# Patient Record
Sex: Male | Born: 1935 | Race: Black or African American | Hispanic: No | Marital: Married | Smoking: Never smoker
Health system: Southern US, Community
[De-identification: ages and names within clinical notes are randomized; demographics above are authoritative.]

## PROBLEM LIST (undated history)

## (undated) DIAGNOSIS — I6381 Other cerebral infarction due to occlusion or stenosis of small artery: Secondary | ICD-10-CM

## (undated) DIAGNOSIS — D509 Iron deficiency anemia, unspecified: Secondary | ICD-10-CM

## (undated) DIAGNOSIS — Z9119 Patient's noncompliance with other medical treatment and regimen: Secondary | ICD-10-CM

## (undated) DIAGNOSIS — I219 Acute myocardial infarction, unspecified: Secondary | ICD-10-CM

## (undated) DIAGNOSIS — I519 Heart disease, unspecified: Secondary | ICD-10-CM

## (undated) DIAGNOSIS — C61 Malignant neoplasm of prostate: Secondary | ICD-10-CM

## (undated) DIAGNOSIS — M1990 Unspecified osteoarthritis, unspecified site: Secondary | ICD-10-CM

## (undated) DIAGNOSIS — R569 Unspecified convulsions: Secondary | ICD-10-CM

## (undated) DIAGNOSIS — J189 Pneumonia, unspecified organism: Secondary | ICD-10-CM

## (undated) DIAGNOSIS — R7301 Impaired fasting glucose: Secondary | ICD-10-CM

## (undated) DIAGNOSIS — Z91199 Patient's noncompliance with other medical treatment and regimen due to unspecified reason: Secondary | ICD-10-CM

## (undated) DIAGNOSIS — I10 Essential (primary) hypertension: Secondary | ICD-10-CM

## (undated) DIAGNOSIS — C189 Malignant neoplasm of colon, unspecified: Principal | ICD-10-CM

## (undated) DIAGNOSIS — N189 Chronic kidney disease, unspecified: Secondary | ICD-10-CM

## (undated) DIAGNOSIS — I2510 Atherosclerotic heart disease of native coronary artery without angina pectoris: Secondary | ICD-10-CM

## (undated) DIAGNOSIS — N529 Male erectile dysfunction, unspecified: Secondary | ICD-10-CM

## (undated) DIAGNOSIS — I639 Cerebral infarction, unspecified: Secondary | ICD-10-CM

## (undated) HISTORY — DX: Other cerebral infarction due to occlusion or stenosis of small artery: I63.81

## (undated) HISTORY — PX: PROSTATE SURGERY: SHX751

## (undated) HISTORY — DX: Patient's noncompliance with other medical treatment and regimen due to unspecified reason: Z91.199

## (undated) HISTORY — DX: Patient's noncompliance with other medical treatment and regimen: Z91.19

## (undated) HISTORY — DX: Iron deficiency anemia, unspecified: D50.9

## (undated) HISTORY — DX: Atherosclerotic heart disease of native coronary artery without angina pectoris: I25.10

## (undated) HISTORY — PX: COLECTOMY: SHX59

## (undated) HISTORY — DX: Malignant neoplasm of colon, unspecified: C18.9

## (undated) HISTORY — DX: Heart disease, unspecified: I51.9

## (undated) HISTORY — DX: Impaired fasting glucose: R73.01

## (undated) HISTORY — DX: Essential (primary) hypertension: I10

## (undated) HISTORY — DX: Male erectile dysfunction, unspecified: N52.9

## (undated) HISTORY — PX: COLONOSCOPY: SHX174

## (undated) HISTORY — PX: CATARACT EXTRACTION: SUR2

---

## 1989-08-07 DIAGNOSIS — C61 Malignant neoplasm of prostate: Secondary | ICD-10-CM

## 1989-08-07 HISTORY — PX: ANKLE FRACTURE SURGERY: SHX122

## 1989-08-07 HISTORY — DX: Malignant neoplasm of prostate: C61

## 2001-08-30 ENCOUNTER — Emergency Department (HOSPITAL_COMMUNITY): Admission: EM | Admit: 2001-08-30 | Discharge: 2001-08-30 | Payer: Self-pay | Admitting: Emergency Medicine

## 2002-07-24 ENCOUNTER — Ambulatory Visit: Admission: RE | Admit: 2002-07-24 | Discharge: 2002-10-20 | Payer: Self-pay | Admitting: Radiation Oncology

## 2003-03-26 ENCOUNTER — Ambulatory Visit (HOSPITAL_COMMUNITY): Admission: RE | Admit: 2003-03-26 | Discharge: 2003-03-26 | Payer: Self-pay | Admitting: Ophthalmology

## 2006-06-19 ENCOUNTER — Emergency Department (HOSPITAL_COMMUNITY): Admission: EM | Admit: 2006-06-19 | Discharge: 2006-06-19 | Payer: Self-pay | Admitting: Emergency Medicine

## 2008-08-27 ENCOUNTER — Ambulatory Visit (HOSPITAL_COMMUNITY): Admission: RE | Admit: 2008-08-27 | Discharge: 2008-08-27 | Payer: Self-pay | Admitting: Family Medicine

## 2010-07-04 ENCOUNTER — Ambulatory Visit: Payer: Self-pay | Admitting: Cardiology

## 2010-07-04 ENCOUNTER — Inpatient Hospital Stay (HOSPITAL_COMMUNITY): Admission: EM | Admit: 2010-07-04 | Discharge: 2010-07-15 | Payer: Self-pay | Admitting: Emergency Medicine

## 2010-07-04 ENCOUNTER — Ambulatory Visit: Payer: Self-pay | Admitting: Gastroenterology

## 2010-07-06 ENCOUNTER — Ambulatory Visit: Payer: Self-pay | Admitting: Gastroenterology

## 2010-07-06 ENCOUNTER — Encounter (INDEPENDENT_AMBULATORY_CARE_PROVIDER_SITE_OTHER): Payer: Self-pay

## 2010-07-07 ENCOUNTER — Encounter (INDEPENDENT_AMBULATORY_CARE_PROVIDER_SITE_OTHER): Payer: Self-pay | Admitting: Internal Medicine

## 2010-07-09 ENCOUNTER — Encounter (INDEPENDENT_AMBULATORY_CARE_PROVIDER_SITE_OTHER): Payer: Self-pay

## 2010-07-21 ENCOUNTER — Encounter: Payer: Self-pay | Admitting: Gastroenterology

## 2010-08-18 ENCOUNTER — Encounter (HOSPITAL_COMMUNITY)
Admission: RE | Admit: 2010-08-18 | Discharge: 2010-09-05 | Payer: Self-pay | Source: Home / Self Care | Admitting: Oncology

## 2010-08-18 ENCOUNTER — Ambulatory Visit (HOSPITAL_COMMUNITY): Payer: Self-pay | Admitting: Oncology

## 2010-09-19 ENCOUNTER — Encounter (HOSPITAL_COMMUNITY)
Admission: RE | Admit: 2010-09-19 | Discharge: 2010-10-19 | Payer: Self-pay | Source: Home / Self Care | Admitting: Hematology and Oncology

## 2010-09-19 ENCOUNTER — Ambulatory Visit (HOSPITAL_COMMUNITY): Payer: Self-pay | Admitting: Hematology and Oncology

## 2010-09-24 ENCOUNTER — Encounter (HOSPITAL_COMMUNITY)
Admission: RE | Admit: 2010-09-24 | Discharge: 2010-10-24 | Payer: Self-pay | Source: Home / Self Care | Admitting: Oncology

## 2010-12-24 ENCOUNTER — Encounter (HOSPITAL_COMMUNITY)
Admission: RE | Admit: 2010-12-24 | Discharge: 2011-01-06 | Payer: Self-pay | Source: Home / Self Care | Attending: Oncology | Admitting: Oncology

## 2010-12-24 ENCOUNTER — Ambulatory Visit (HOSPITAL_COMMUNITY)
Admission: RE | Admit: 2010-12-24 | Discharge: 2011-01-06 | Payer: Self-pay | Source: Home / Self Care | Attending: Oncology | Admitting: Oncology

## 2010-12-29 LAB — COMPREHENSIVE METABOLIC PANEL
ALT: 12 U/L (ref 0–53)
AST: 18 U/L (ref 0–37)
Albumin: 4.1 g/dL (ref 3.5–5.2)
Alkaline Phosphatase: 74 U/L (ref 39–117)
BUN: 11 mg/dL (ref 6–23)
CO2: 32 mEq/L (ref 19–32)
Calcium: 9.3 mg/dL (ref 8.4–10.5)
Chloride: 97 mEq/L (ref 96–112)
Creatinine, Ser: 0.98 mg/dL (ref 0.4–1.5)
GFR calc Af Amer: >60 mL/min (ref >60)
GFR calc non Af Amer: >60 mL/min (ref >60)
Potassium: 3.4 mEq/L — ABNORMAL LOW (ref 3.5–5.1)
Sodium: 139 mEq/L (ref 135–145)
Total Bilirubin: 1.2 mg/dL (ref 0.3–1.2)
Total Protein: 7.6 g/dL (ref 6.0–8.3)

## 2011-01-06 NOTE — Letter (Signed)
Summary: PATHOLOGY REPORT FROM 07/06/10  PATHOLOGY REPORT FROM 07/06/10   Imported By: Rexene Alberts 07/21/2010 13:51:26  _____________________________________________________________________  External Attachment:    Type:   Image     Comment:   External Document

## 2011-01-06 NOTE — Letter (Signed)
Summary: PATHOLOGY RESULTS FROM 07/09/10  PATHOLOGY RESULTS FROM 07/09/10   Imported By: Rexene Alberts 07/21/2010 13:52:56  _____________________________________________________________________  External Attachment:    Type:   Image     Comment:   External Document

## 2011-01-06 NOTE — Letter (Signed)
Summary: DISCHARGE SUMMARY FROM08/09/11  DISCHARGE SUMMARY FROM08/09/11   Imported By: Rexene Alberts 07/21/2010 13:48:53  _____________________________________________________________________  External Attachment:    Type:   Image     Comment:   External Document

## 2011-01-06 NOTE — Letter (Signed)
Summary: CONSULTATION FROM 07/04/10  CONSULTATION FROM 07/04/10   Imported By: Rexene Alberts 07/21/2010 13:46:57  _____________________________________________________________________  External Attachment:    Type:   Image     Comment:   External Document

## 2011-02-18 LAB — CBC
HCT: 33 % — ABNORMAL LOW (ref 39.0–52.0)
Hemoglobin: 10.8 g/dL — ABNORMAL LOW (ref 13.0–17.0)
MCH: 26.5 pg (ref 26.0–34.0)
MCHC: 32.8 g/dL (ref 30.0–36.0)
MCV: 80.8 fL (ref 78.0–100.0)
Platelets: 241 10*3/uL (ref 150–400)
RBC: 4.08 MIL/uL — ABNORMAL LOW (ref 4.22–5.81)
RDW: 13.8 % (ref 11.5–15.5)
WBC: 5.5 10*3/uL (ref 4.0–10.5)

## 2011-02-18 LAB — COMPREHENSIVE METABOLIC PANEL
ALT: 15 U/L (ref 0–53)
AST: 19 U/L (ref 0–37)
Albumin: 3.8 g/dL (ref 3.5–5.2)
Alkaline Phosphatase: 74 U/L (ref 39–117)
BUN: 6 mg/dL (ref 6–23)
CO2: 31 mEq/L (ref 19–32)
Calcium: 9.3 mg/dL (ref 8.4–10.5)
Chloride: 101 mEq/L (ref 96–112)
Creatinine, Ser: 0.88 mg/dL (ref 0.4–1.5)
GFR calc Af Amer: >60 mL/min (ref >60)
GFR calc non Af Amer: >60 mL/min (ref >60)
Glucose, Bld: 71 mg/dL (ref 70–99)
Potassium: 3.4 mEq/L — ABNORMAL LOW (ref 3.5–5.1)
Sodium: 138 mEq/L (ref 135–145)
Total Bilirubin: 0.7 mg/dL (ref 0.3–1.2)
Total Protein: 7.5 g/dL (ref 6.0–8.3)

## 2011-02-18 LAB — DIFFERENTIAL
Basophils Absolute: 0 10*3/uL (ref 0.0–0.1)
Basophils Relative: 0 % (ref 0–1)
Eosinophils Absolute: 0.1 10*3/uL (ref 0.0–0.7)
Eosinophils Relative: 1 % (ref 0–5)
Lymphocytes Relative: 22 % (ref 12–46)
Lymphs Abs: 1.2 10*3/uL (ref 0.7–4.0)
Monocytes Absolute: 0.5 10*3/uL (ref 0.1–1.0)
Monocytes Relative: 8 % (ref 3–12)
Neutro Abs: 3.8 10*3/uL (ref 1.7–7.7)
Neutrophils Relative %: 68 % (ref 43–77)

## 2011-02-18 LAB — CEA: CEA: 2.2 ng/mL (ref 0.0–5.0)

## 2011-02-18 LAB — PSA: PSA: 0.53 ng/mL (ref 0.10–4.00)

## 2011-02-18 LAB — LACTATE DEHYDROGENASE: LDH: 145 U/L (ref 94–250)

## 2011-02-19 LAB — IRON AND TIBC
Iron: 29 ug/dL — ABNORMAL LOW (ref 42–135)
Saturation Ratios: 10 % — ABNORMAL LOW (ref 20–55)
TIBC: 295 ug/dL (ref 215–435)
UIBC: 266 ug/dL

## 2011-02-19 LAB — COMPREHENSIVE METABOLIC PANEL
ALT: 13 U/L (ref 0–53)
AST: 18 U/L (ref 0–37)
Albumin: 3.5 g/dL (ref 3.5–5.2)
Alkaline Phosphatase: 84 U/L (ref 39–117)
BUN: 12 mg/dL (ref 6–23)
CO2: 31 mEq/L (ref 19–32)
Calcium: 9.3 mg/dL (ref 8.4–10.5)
Chloride: 102 mEq/L (ref 96–112)
Creatinine, Ser: 1 mg/dL (ref 0.4–1.5)
GFR calc Af Amer: >60 mL/min (ref >60)
GFR calc non Af Amer: >60 mL/min (ref >60)
Glucose, Bld: 103 mg/dL — ABNORMAL HIGH (ref 70–99)
Potassium: 4 mEq/L (ref 3.5–5.1)
Sodium: 139 mEq/L (ref 135–145)
Total Bilirubin: 0.4 mg/dL (ref 0.3–1.2)
Total Protein: 8.4 g/dL — ABNORMAL HIGH (ref 6.0–8.3)

## 2011-02-19 LAB — DIFFERENTIAL
Basophils Absolute: 0 10*3/uL (ref 0.0–0.1)
Basophils Relative: 0 % (ref 0–1)
Eosinophils Absolute: 0.3 10*3/uL (ref 0.0–0.7)
Eosinophils Relative: 5 % (ref 0–5)
Lymphocytes Relative: 21 % (ref 12–46)
Lymphs Abs: 1.3 10*3/uL (ref 0.7–4.0)
Monocytes Absolute: 0.3 10*3/uL (ref 0.1–1.0)
Monocytes Relative: 5 % (ref 3–12)
Neutro Abs: 4.3 10*3/uL (ref 1.7–7.7)
Neutrophils Relative %: 69 % (ref 43–77)

## 2011-02-19 LAB — FERRITIN: Ferritin: 20 ng/mL — ABNORMAL LOW (ref 22–322)

## 2011-02-19 LAB — CBC
HCT: 33.4 % — ABNORMAL LOW (ref 39.0–52.0)
Hemoglobin: 10.8 g/dL — ABNORMAL LOW (ref 13.0–17.0)
MCH: 26.1 pg (ref 26.0–34.0)
MCHC: 32.3 g/dL (ref 30.0–36.0)
MCV: 80.9 fL (ref 78.0–100.0)
Platelets: 233 10*3/uL (ref 150–400)
RBC: 4.13 MIL/uL — ABNORMAL LOW (ref 4.22–5.81)
RDW: 24.4 % — ABNORMAL HIGH (ref 11.5–15.5)
WBC: 6.2 10*3/uL (ref 4.0–10.5)

## 2011-02-19 LAB — CEA: CEA: 2 ng/mL (ref 0.0–5.0)

## 2011-02-20 LAB — PHOSPHORUS
Phosphorus: 3.2 mg/dL (ref 2.3–4.6)
Phosphorus: 3.4 mg/dL (ref 2.3–4.6)

## 2011-02-20 LAB — LIPID PANEL
Cholesterol: 55 mg/dL (ref 0–200)
HDL: 32 mg/dL — ABNORMAL LOW (ref >39)
LDL Cholesterol: 5 mg/dL (ref 0–99)
Total CHOL/HDL Ratio: 1.7 RATIO
Triglycerides: 91 mg/dL (ref <150)
VLDL: 18 mg/dL (ref 0–40)

## 2011-02-20 LAB — BASIC METABOLIC PANEL
BUN: 1 mg/dL — ABNORMAL LOW (ref 6–23)
BUN: 3 mg/dL — ABNORMAL LOW (ref 6–23)
BUN: 3 mg/dL — ABNORMAL LOW (ref 6–23)
BUN: 3 mg/dL — ABNORMAL LOW (ref 6–23)
BUN: 5 mg/dL — ABNORMAL LOW (ref 6–23)
BUN: 5 mg/dL — ABNORMAL LOW (ref 6–23)
BUN: 5 mg/dL — ABNORMAL LOW (ref 6–23)
CO2: 25 mEq/L (ref 19–32)
CO2: 25 mEq/L (ref 19–32)
CO2: 27 mEq/L (ref 19–32)
Calcium: 8 mg/dL — ABNORMAL LOW (ref 8.4–10.5)
Calcium: 8.1 mg/dL — ABNORMAL LOW (ref 8.4–10.5)
Calcium: 8.3 mg/dL — ABNORMAL LOW (ref 8.4–10.5)
Calcium: 8.3 mg/dL — ABNORMAL LOW (ref 8.4–10.5)
Calcium: 8.4 mg/dL (ref 8.4–10.5)
Chloride: 102 mEq/L (ref 96–112)
Chloride: 102 mEq/L (ref 96–112)
Chloride: 103 mEq/L (ref 96–112)
Chloride: 104 mEq/L (ref 96–112)
Creatinine, Ser: 0.72 mg/dL (ref 0.4–1.5)
Creatinine, Ser: 0.77 mg/dL (ref 0.4–1.5)
Creatinine, Ser: 0.8 mg/dL (ref 0.4–1.5)
Creatinine, Ser: 1 mg/dL (ref 0.4–1.5)
Creatinine, Ser: 1.03 mg/dL (ref 0.4–1.5)
GFR calc Af Amer: >60 mL/min (ref >60)
GFR calc Af Amer: >60 mL/min (ref >60)
GFR calc Af Amer: >60 mL/min (ref >60)
GFR calc Af Amer: >60 mL/min (ref >60)
GFR calc non Af Amer: >60 mL/min (ref >60)
GFR calc non Af Amer: >60 mL/min (ref >60)
GFR calc non Af Amer: >60 mL/min (ref >60)
GFR calc non Af Amer: >60 mL/min (ref >60)
GFR calc non Af Amer: >60 mL/min (ref >60)
Glucose, Bld: 104 mg/dL — ABNORMAL HIGH (ref 70–99)
Glucose, Bld: 113 mg/dL — ABNORMAL HIGH (ref 70–99)
Glucose, Bld: 90 mg/dL (ref 70–99)
Glucose, Bld: 93 mg/dL (ref 70–99)
Potassium: 3.4 mEq/L — ABNORMAL LOW (ref 3.5–5.1)
Potassium: 3.6 mEq/L (ref 3.5–5.1)
Potassium: 3.8 mEq/L (ref 3.5–5.1)
Potassium: 3.8 mEq/L (ref 3.5–5.1)
Potassium: 3.9 mEq/L (ref 3.5–5.1)
Potassium: 3.9 mEq/L (ref 3.5–5.1)
Sodium: 132 mEq/L — ABNORMAL LOW (ref 135–145)
Sodium: 134 mEq/L — ABNORMAL LOW (ref 135–145)
Sodium: 135 mEq/L (ref 135–145)
Sodium: 138 mEq/L (ref 135–145)

## 2011-02-20 LAB — DIFFERENTIAL
Basophils Absolute: 0 10*3/uL (ref 0.0–0.1)
Basophils Absolute: 0 10*3/uL (ref 0.0–0.1)
Basophils Absolute: 0 10*3/uL (ref 0.0–0.1)
Basophils Absolute: 0 10*3/uL (ref 0.0–0.1)
Basophils Relative: 0 % (ref 0–1)
Basophils Relative: 0 % (ref 0–1)
Basophils Relative: 0 % (ref 0–1)
Eosinophils Absolute: 0 10*3/uL (ref 0.0–0.7)
Eosinophils Absolute: 0.1 10*3/uL (ref 0.0–0.7)
Eosinophils Absolute: 0.2 10*3/uL (ref 0.0–0.7)
Eosinophils Relative: 0 % (ref 0–5)
Eosinophils Relative: 0 % (ref 0–5)
Eosinophils Relative: 1 % (ref 0–5)
Eosinophils Relative: 2 % (ref 0–5)
Lymphocytes Relative: 10 % — ABNORMAL LOW (ref 12–46)
Lymphocytes Relative: 12 % (ref 12–46)
Lymphocytes Relative: 15 % (ref 12–46)
Lymphocytes Relative: 17 % (ref 12–46)
Lymphocytes Relative: 19 % (ref 12–46)
Lymphocytes Relative: 7 % — ABNORMAL LOW (ref 12–46)
Lymphs Abs: 0.9 10*3/uL (ref 0.7–4.0)
Lymphs Abs: 0.9 10*3/uL (ref 0.7–4.0)
Lymphs Abs: 1.1 10*3/uL (ref 0.7–4.0)
Lymphs Abs: 1.3 10*3/uL (ref 0.7–4.0)
Monocytes Absolute: 0.4 10*3/uL (ref 0.1–1.0)
Monocytes Absolute: 0.4 10*3/uL (ref 0.1–1.0)
Monocytes Absolute: 0.5 10*3/uL (ref 0.1–1.0)
Monocytes Absolute: 0.5 10*3/uL (ref 0.1–1.0)
Monocytes Absolute: 0.5 10*3/uL (ref 0.1–1.0)
Monocytes Relative: 4 % (ref 3–12)
Monocytes Relative: 6 % (ref 3–12)
Monocytes Relative: 6 % (ref 3–12)
Monocytes Relative: 7 % (ref 3–12)
Neutro Abs: 4.6 10*3/uL (ref 1.7–7.7)
Neutro Abs: 5.5 10*3/uL (ref 1.7–7.7)
Neutro Abs: 5.8 10*3/uL (ref 1.7–7.7)
Neutro Abs: 9.6 10*3/uL — ABNORMAL HIGH (ref 1.7–7.7)
Neutrophils Relative %: 72 % (ref 43–77)
Neutrophils Relative %: 76 % (ref 43–77)
Neutrophils Relative %: 77 % (ref 43–77)

## 2011-02-20 LAB — CBC
HCT: 27.3 % — ABNORMAL LOW (ref 39.0–52.0)
HCT: 29.2 % — ABNORMAL LOW (ref 39.0–52.0)
HCT: 30.9 % — ABNORMAL LOW (ref 39.0–52.0)
HCT: 31.2 % — ABNORMAL LOW (ref 39.0–52.0)
HCT: 34.1 % — ABNORMAL LOW (ref 39.0–52.0)
Hemoglobin: 8.3 g/dL — ABNORMAL LOW (ref 13.0–17.0)
Hemoglobin: 9 g/dL — ABNORMAL LOW (ref 13.0–17.0)
Hemoglobin: 9.7 g/dL — ABNORMAL LOW (ref 13.0–17.0)
Hemoglobin: 9.8 g/dL — ABNORMAL LOW (ref 13.0–17.0)
MCH: 21.8 pg — ABNORMAL LOW (ref 26.0–34.0)
MCH: 23.2 pg — ABNORMAL LOW (ref 26.0–34.0)
MCH: 23.5 pg — ABNORMAL LOW (ref 26.0–34.0)
MCHC: 30.7 g/dL (ref 30.0–36.0)
MCHC: 31 g/dL (ref 30.0–36.0)
MCHC: 31.1 g/dL (ref 30.0–36.0)
MCV: 71.1 fL — ABNORMAL LOW (ref 78.0–100.0)
MCV: 73.5 fL — ABNORMAL LOW (ref 78.0–100.0)
MCV: 74.4 fL — ABNORMAL LOW (ref 78.0–100.0)
Platelets: 130 10*3/uL — ABNORMAL LOW (ref 150–400)
Platelets: 132 10*3/uL — ABNORMAL LOW (ref 150–400)
Platelets: 149 10*3/uL — ABNORMAL LOW (ref 150–400)
Platelets: 216 10*3/uL (ref 150–400)
RBC: 4.11 MIL/uL — ABNORMAL LOW (ref 4.22–5.81)
RBC: 4.2 MIL/uL — ABNORMAL LOW (ref 4.22–5.81)
RBC: 4.21 MIL/uL — ABNORMAL LOW (ref 4.22–5.81)
RBC: 4.24 MIL/uL (ref 4.22–5.81)
RDW: 35 % — ABNORMAL HIGH (ref 11.5–15.5)
RDW: 35.2 % — ABNORMAL HIGH (ref 11.5–15.5)
RDW: 35.3 % — ABNORMAL HIGH (ref 11.5–15.5)
RDW: 35.5 % — ABNORMAL HIGH (ref 11.5–15.5)
RDW: 35.6 % — ABNORMAL HIGH (ref 11.5–15.5)
RDW: 35.7 % — ABNORMAL HIGH (ref 11.5–15.5)
WBC: 10.9 10*3/uL — ABNORMAL HIGH (ref 4.0–10.5)
WBC: 6.4 10*3/uL (ref 4.0–10.5)
WBC: 7.3 10*3/uL (ref 4.0–10.5)
WBC: 7.4 10*3/uL (ref 4.0–10.5)
WBC: 7.7 10*3/uL (ref 4.0–10.5)
WBC: 9 10*3/uL (ref 4.0–10.5)

## 2011-02-20 LAB — GLUCOSE, CAPILLARY: Glucose-Capillary: 113 mg/dL — ABNORMAL HIGH (ref 70–99)

## 2011-02-20 LAB — ALBUMIN
Albumin: 2.7 g/dL — ABNORMAL LOW (ref 3.5–5.2)
Albumin: 2.8 g/dL — ABNORMAL LOW (ref 3.5–5.2)
Albumin: 2.8 g/dL — ABNORMAL LOW (ref 3.5–5.2)

## 2011-02-20 LAB — MRSA PCR SCREENING: MRSA by PCR: NEGATIVE

## 2011-02-20 LAB — CROSSMATCH

## 2011-02-20 LAB — HEMOGLOBIN AND HEMATOCRIT, BLOOD
HCT: 33.2 % — ABNORMAL LOW (ref 39.0–52.0)
Hemoglobin: 10.1 g/dL — ABNORMAL LOW (ref 13.0–17.0)

## 2011-02-20 LAB — PREPARE RBC (CROSSMATCH)

## 2011-02-20 LAB — MAGNESIUM
Magnesium: 1.5 mg/dL (ref 1.5–2.5)
Magnesium: 1.7 mg/dL (ref 1.5–2.5)

## 2011-02-21 LAB — HEMOGLOBIN AND HEMATOCRIT, BLOOD: Hemoglobin: 7.4 g/dL — ABNORMAL LOW (ref 13.0–17.0)

## 2011-02-21 LAB — BASIC METABOLIC PANEL
GFR calc non Af Amer: >60 mL/min (ref >60)
Potassium: 3.6 mEq/L (ref 3.5–5.1)
Sodium: 134 mEq/L — ABNORMAL LOW (ref 135–145)

## 2011-02-21 LAB — CBC
HCT: 32.3 % — ABNORMAL LOW (ref 39.0–52.0)
Hemoglobin: 4.4 g/dL — CL (ref 13.0–17.0)
Hemoglobin: 9 g/dL — ABNORMAL LOW (ref 13.0–17.0)
MCH: 15.2 pg — ABNORMAL LOW (ref 26.0–34.0)
MCH: 21.5 pg — ABNORMAL LOW (ref 26.0–34.0)
MCH: 21.7 pg — ABNORMAL LOW (ref 26.0–34.0)
MCHC: 27.6 g/dL — ABNORMAL LOW (ref 30.0–36.0)
MCHC: 30.8 g/dL (ref 30.0–36.0)
Platelets: 198 10*3/uL (ref 150–400)
RBC: 4.19 MIL/uL — ABNORMAL LOW (ref 4.22–5.81)
RDW: 23.6 % — ABNORMAL HIGH (ref 11.5–15.5)
RDW: 34.1 % — ABNORMAL HIGH (ref 11.5–15.5)
WBC: 7.1 10*3/uL (ref 4.0–10.5)

## 2011-02-21 LAB — DIFFERENTIAL
Basophils Absolute: 0 10*3/uL (ref 0.0–0.1)
Basophils Absolute: 0 10*3/uL (ref 0.0–0.1)
Basophils Absolute: 0.1 10*3/uL (ref 0.0–0.1)
Basophils Relative: 2 % — ABNORMAL HIGH (ref 0–1)
Eosinophils Relative: 0 % (ref 0–5)
Eosinophils Relative: 1 % (ref 0–5)
Lymphocytes Relative: 11 % — ABNORMAL LOW (ref 12–46)
Lymphocytes Relative: 12 % (ref 12–46)
Metamyelocytes Relative: 0 %
Monocytes Relative: 5 % (ref 3–12)
Monocytes Relative: 9 % (ref 3–12)
Myelocytes: 0 %
Neutro Abs: 5.3 10*3/uL (ref 1.7–7.7)
Neutrophils Relative %: 72 % (ref 43–77)
Neutrophils Relative %: 78 % — ABNORMAL HIGH (ref 43–77)
Promyelocytes Absolute: 0 %

## 2011-02-21 LAB — COMPREHENSIVE METABOLIC PANEL
CO2: 22 mEq/L (ref 19–32)
Calcium: 8.8 mg/dL (ref 8.4–10.5)
Creatinine, Ser: 1.31 mg/dL (ref 0.4–1.5)
GFR calc non Af Amer: 54 mL/min — ABNORMAL LOW (ref >60)
Glucose, Bld: 137 mg/dL — ABNORMAL HIGH (ref 70–99)
Potassium: 2.7 mEq/L — CL (ref 3.5–5.1)
Total Protein: 7.7 g/dL (ref 6.0–8.3)

## 2011-02-21 LAB — IRON AND TIBC: Iron: <10 ug/dL — ABNORMAL LOW (ref 42–135)

## 2011-02-21 LAB — TSH: TSH: 1.313 u[IU]/mL (ref 0.350–4.500)

## 2011-02-21 LAB — URINALYSIS, ROUTINE W REFLEX MICROSCOPIC
Bilirubin Urine: NEGATIVE
Ketones, ur: NEGATIVE mg/dL
Nitrite: NEGATIVE
Protein, ur: NEGATIVE mg/dL
pH: 6 (ref 5.0–8.0)

## 2011-02-21 LAB — BRAIN NATRIURETIC PEPTIDE: Pro B Natriuretic peptide (BNP): 120 pg/mL — ABNORMAL HIGH (ref 0.0–100.0)

## 2011-02-21 LAB — CROSSMATCH
ABO/RH(D): B POS
Antibody Screen: NEGATIVE

## 2011-02-21 LAB — RETICULOCYTES
RBC.: 2.7 MIL/uL — ABNORMAL LOW (ref 4.22–5.81)
Retic Ct Pct: 2.4 % (ref 0.4–3.1)

## 2011-02-21 LAB — PREPARE RBC (CROSSMATCH)

## 2011-02-21 LAB — CK TOTAL AND CKMB (NOT AT ARMC): Relative Index: INVALID (ref 0.0–2.5)

## 2011-02-21 LAB — FERRITIN: Ferritin: 2 ng/mL — ABNORMAL LOW (ref 22–322)

## 2011-02-21 LAB — PROTIME-INR
INR: 1.22 (ref 0.00–1.49)
Prothrombin Time: 15.3 seconds — ABNORMAL HIGH (ref 11.6–15.2)

## 2011-02-21 LAB — CEA: CEA: 2.3 ng/mL (ref 0.0–5.0)

## 2011-03-18 ENCOUNTER — Encounter (HOSPITAL_COMMUNITY): Payer: Medicare Other | Attending: Oncology

## 2011-03-18 DIAGNOSIS — D649 Anemia, unspecified: Secondary | ICD-10-CM | POA: Insufficient documentation

## 2011-03-18 DIAGNOSIS — Z79899 Other long term (current) drug therapy: Secondary | ICD-10-CM | POA: Insufficient documentation

## 2011-03-18 DIAGNOSIS — C185 Malignant neoplasm of splenic flexure: Secondary | ICD-10-CM

## 2011-03-18 DIAGNOSIS — Z8546 Personal history of malignant neoplasm of prostate: Secondary | ICD-10-CM | POA: Insufficient documentation

## 2011-03-20 ENCOUNTER — Encounter (HOSPITAL_COMMUNITY): Payer: Medicare Other | Admitting: Oncology

## 2011-03-20 DIAGNOSIS — C189 Malignant neoplasm of colon, unspecified: Secondary | ICD-10-CM

## 2011-05-20 ENCOUNTER — Encounter (HOSPITAL_COMMUNITY): Payer: Self-pay | Admitting: *Deleted

## 2011-06-12 ENCOUNTER — Other Ambulatory Visit (HOSPITAL_COMMUNITY): Payer: Self-pay | Admitting: Oncology

## 2011-06-12 ENCOUNTER — Encounter (HOSPITAL_COMMUNITY): Payer: Self-pay | Admitting: Oncology

## 2011-06-12 DIAGNOSIS — C189 Malignant neoplasm of colon, unspecified: Secondary | ICD-10-CM

## 2011-06-12 DIAGNOSIS — D509 Iron deficiency anemia, unspecified: Secondary | ICD-10-CM

## 2011-06-12 HISTORY — DX: Malignant neoplasm of colon, unspecified: C18.9

## 2011-06-12 HISTORY — DX: Iron deficiency anemia, unspecified: D50.9

## 2011-06-13 ENCOUNTER — Other Ambulatory Visit (HOSPITAL_COMMUNITY): Payer: Self-pay | Admitting: Oncology

## 2011-06-13 DIAGNOSIS — D509 Iron deficiency anemia, unspecified: Secondary | ICD-10-CM

## 2011-06-13 DIAGNOSIS — C189 Malignant neoplasm of colon, unspecified: Secondary | ICD-10-CM

## 2011-06-19 ENCOUNTER — Encounter (HOSPITAL_COMMUNITY): Payer: Medicare Other | Attending: Oncology

## 2011-06-19 ENCOUNTER — Other Ambulatory Visit (HOSPITAL_COMMUNITY): Payer: Self-pay | Admitting: Oncology

## 2011-06-19 ENCOUNTER — Telehealth (HOSPITAL_COMMUNITY): Payer: Self-pay | Admitting: *Deleted

## 2011-06-19 DIAGNOSIS — C185 Malignant neoplasm of splenic flexure: Secondary | ICD-10-CM | POA: Insufficient documentation

## 2011-06-19 DIAGNOSIS — D649 Anemia, unspecified: Secondary | ICD-10-CM | POA: Insufficient documentation

## 2011-06-19 DIAGNOSIS — D509 Iron deficiency anemia, unspecified: Secondary | ICD-10-CM

## 2011-06-19 DIAGNOSIS — C189 Malignant neoplasm of colon, unspecified: Secondary | ICD-10-CM

## 2011-06-19 DIAGNOSIS — Z79899 Other long term (current) drug therapy: Secondary | ICD-10-CM | POA: Insufficient documentation

## 2011-06-19 DIAGNOSIS — Z8546 Personal history of malignant neoplasm of prostate: Secondary | ICD-10-CM | POA: Insufficient documentation

## 2011-06-19 LAB — BASIC METABOLIC PANEL
CO2: 33 mEq/L — ABNORMAL HIGH (ref 19–32)
Chloride: 101 mEq/L (ref 96–112)
GFR calc Af Amer: >60 mL/min (ref >60)
Potassium: 3.2 mEq/L — ABNORMAL LOW (ref 3.5–5.1)
Sodium: 142 mEq/L (ref 135–145)

## 2011-06-19 LAB — CEA: CEA: 1.9 ng/mL (ref 0.0–5.0)

## 2011-06-19 NOTE — Telephone Encounter (Signed)
Pt ran out of K+. They are getting it filled. He takes 10 meq a day.

## 2011-07-08 ENCOUNTER — Encounter: Payer: Self-pay | Admitting: Gastroenterology

## 2011-08-12 ENCOUNTER — Telehealth: Payer: Self-pay | Admitting: Gastroenterology

## 2011-08-12 ENCOUNTER — Ambulatory Visit: Payer: Medicare Other | Admitting: Gastroenterology

## 2011-08-13 NOTE — Telephone Encounter (Signed)
Please call pt. He was a no show today for his appt. He had colon CA Dx in JUL 2011. He needs a repeat colonoscopy IN 2012.

## 2011-08-13 NOTE — Telephone Encounter (Signed)
LMOM for pt to call us back and reschedule his missed appt

## 2011-08-20 ENCOUNTER — Encounter: Payer: Self-pay | Admitting: Gastroenterology

## 2011-08-20 NOTE — Telephone Encounter (Signed)
Tried to call pt, still no answer will mail letter

## 2011-09-23 ENCOUNTER — Other Ambulatory Visit (HOSPITAL_COMMUNITY): Payer: Medicare Other

## 2011-09-25 ENCOUNTER — Other Ambulatory Visit (HOSPITAL_COMMUNITY): Payer: Self-pay | Admitting: Oncology

## 2011-09-25 ENCOUNTER — Telehealth (HOSPITAL_COMMUNITY): Payer: Self-pay

## 2011-09-25 ENCOUNTER — Encounter (HOSPITAL_COMMUNITY): Payer: Medicare Other | Attending: Oncology | Admitting: Oncology

## 2011-09-25 DIAGNOSIS — C185 Malignant neoplasm of splenic flexure: Secondary | ICD-10-CM

## 2011-09-25 DIAGNOSIS — Z8546 Personal history of malignant neoplasm of prostate: Secondary | ICD-10-CM

## 2011-09-25 DIAGNOSIS — C189 Malignant neoplasm of colon, unspecified: Secondary | ICD-10-CM | POA: Insufficient documentation

## 2011-09-25 DIAGNOSIS — G609 Hereditary and idiopathic neuropathy, unspecified: Secondary | ICD-10-CM | POA: Insufficient documentation

## 2011-09-25 DIAGNOSIS — D509 Iron deficiency anemia, unspecified: Secondary | ICD-10-CM | POA: Insufficient documentation

## 2011-09-25 DIAGNOSIS — I10 Essential (primary) hypertension: Secondary | ICD-10-CM | POA: Insufficient documentation

## 2011-09-25 DIAGNOSIS — R5381 Other malaise: Secondary | ICD-10-CM | POA: Insufficient documentation

## 2011-09-25 DIAGNOSIS — R5383 Other fatigue: Secondary | ICD-10-CM | POA: Insufficient documentation

## 2011-09-25 DIAGNOSIS — Z79899 Other long term (current) drug therapy: Secondary | ICD-10-CM | POA: Insufficient documentation

## 2011-09-25 LAB — CBC
HCT: 39.4 % (ref 39.0–52.0)
Hemoglobin: 12.9 g/dL — ABNORMAL LOW (ref 13.0–17.0)
MCHC: 32.7 g/dL (ref 30.0–36.0)
WBC: 5.9 10*3/uL (ref 4.0–10.5)

## 2011-09-25 LAB — COMPREHENSIVE METABOLIC PANEL
AST: 16 U/L (ref 0–37)
Albumin: 3.8 g/dL (ref 3.5–5.2)
Alkaline Phosphatase: 104 U/L (ref 39–117)
BUN: 10 mg/dL (ref 6–23)
CO2: 28 mEq/L (ref 19–32)
Chloride: 100 mEq/L (ref 96–112)
Creatinine, Ser: 0.85 mg/dL (ref 0.50–1.35)
GFR calc non Af Amer: 84 mL/min — ABNORMAL LOW (ref >90)
Potassium: 3.3 mEq/L — ABNORMAL LOW (ref 3.5–5.1)
Total Bilirubin: 0.5 mg/dL (ref 0.3–1.2)

## 2011-09-25 LAB — DIFFERENTIAL
Basophils Absolute: 0 10*3/uL (ref 0.0–0.1)
Basophils Relative: 0 % (ref 0–1)
Lymphocytes Relative: 16 % (ref 12–46)
Monocytes Absolute: 0.3 10*3/uL (ref 0.1–1.0)
Monocytes Relative: 5 % (ref 3–12)
Neutro Abs: 4.7 10*3/uL (ref 1.7–7.7)
Neutrophils Relative %: 78 % — ABNORMAL HIGH (ref 43–77)

## 2011-09-25 NOTE — Progress Notes (Signed)
No primary provider on file. No primary provider on file.  1. Adenocarcinoma of colon  CBC, Differential, CEA, Comprehensive metabolic panel, CBC, Differential, Comprehensive metabolic panel, CEA  2. Iron deficiency anemia  Ferritin, Ferritin    CURRENT THERAPY: Status post surgery on 07/09/2010 with 1 positive lymph node. Patient was too weak and too disabled to undergo adjuvant chemotherapy.  INTERVAL HISTORY: Francisco Johnston 77 y.o. 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  Patient denies any complaints. He reports he continues to walk within his home well. He continues to walk to his mailbox daily to obtain the mail newspaper. He reports that his driveway is slightly on an uphill slope and therefore his trek back to the house causes him to get fatigued and worn down. He proudly tells me that he is able to get back to the house without stopping.  Patient reports that his memory is not as good as it once was. Mini-Mental status test was performed and the patient completed all questions appropriately.  Patient denies any change in the caliber of the stool. He denies any blood in his stool. He denies any black tarry stool. He denies any change in weight or appetite.  Past Medical History  Diagnosis Date  . Heart disease   . Hypertension   . Cancer     colon  . Adenocarcinoma of colon 06/12/2011  . Iron deficiency anemia 06/12/2011    has Adenocarcinoma of colon and Iron deficiency anemia on his problem list.      has no known allergies.  Francisco Johnston does not currently have medications on file.  Past Surgical History  Procedure Date  . Colectomy   . Colonoscopy     Denies any headaches, dizziness, double vision, fevers, chills, night sweats, nausea, vomiting, diarrhea, constipation, chest pain, heart palpitations, shortness of breath, blood in stool, black tarry stool, urinary pain, urinary  burning, urinary frequency, hematuria.   PHYSICAL EXAMINATION  ECOG PERFORMANCE STATUS: 0 - Asymptomatic  Filed Vitals:   09/25/11 0955  BP: 192/82  Pulse: 99  Temp: 98.3 F (36.8 C)    GENERAL:alert, no distress, well nourished, well developed, comfortable, cooperative and smiling.  Seen with walker nearby. SKIN: skin color, texture, turgor are normal HEAD: Normocephalic EYES: normal EARS: External ears normal OROPHARYNX:mucous membranes are moist  NECK: supple, no adenopathy, no bruits, no JVD, thyroid normal size, non-tender, without nodularity, no stridor, non-tender, trachea midline LYMPH:  no palpable lymphadenopathy, no hepatosplenomegaly BREAST:not examined LUNGS: clear to auscultation  HEART: regular rate & rhythm, no murmurs, no gallops, S1 normal and S2 normal ABDOMEN:abdomen soft, non-tender, normal bowel sounds and no hepatosplenomegaly BACK: Back symmetric, no curvature. EXTREMITIES:less then 2 second capillary refill, no joint deformities, effusion, or inflammation, no edema, no skin discoloration, no clubbing, no cyanosis  NEURO: alert & oriented x 3 with fluent speech, no focal motor/sensory deficits.  Patient probably answers where he is today, president of the [place], he is unable to find the correct name for the gentleman running against the president of [place] but he is able to me that he is Caucasian, month, year, day of the week.   LABORATORY DATA: CBC    Component Value Date/Time   WBC 5.5 09/19/2010 1111   RBC 4.08* 09/19/2010 1111   HGB 10.8* 09/19/2010 1111   HCT 33.0* 09/19/2010 1111   PLT 241 09/19/2010 1111   MCV  80.8 09/19/2010 1111   MCH 26.5 09/19/2010 1111   MCHC 32.8 09/19/2010 1111   RDW 13.8 09/19/2010 1111   LYMPHSABS 1.2 09/19/2010 1111   MONOABS 0.5 09/19/2010 1111   EOSABS 0.1 09/19/2010 1111   BASOSABS 0.0 09/19/2010 1111     PENDING LABS: CBC diff, BMET/CMET, CEA, Ferritin    ASSESSMENT:  1. 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  severe weakness and fatigue 3. Peripheral neuropathy of the right leg which writing of the right foot 4. History of myocardial infarctions in 1970 and 1982 5. Hypertension 6. Hypokalemia, we will evaluate today. 7. Severe iron deficient anemia at presentation with a hemoglobin of 4 and a ferritin of 2. 8. History of prostate cancer status post radiation therapy in 2003 by Dr. Chipper Herb.   PLAN:  1. lab work today: CBC differential, basic metabolic panel/complete metabolic panel, CEA, and ferritin 2. lab work in 6 months time: CBC differential complete metabolic panel and CEA 3. Return in 6 months time for followup.    All questions were answered. The patient knows to call the clinic with any problems, questions or concerns. We can certainly see the patient much sooner if necessary.   Andris Brothers

## 2011-09-25 NOTE — Telephone Encounter (Signed)
Increase to TID dosing

## 2011-09-25 NOTE — Telephone Encounter (Signed)
Dellis Anes, PA 09/25/2011 3:39 PM Signed  Increase to TID dosing Evelena Leyden, RN 09/25/2011 3:01 PM Signed  Notes Recorded by Dellis Anes, PA on 09/25/2011 at 1:47 PM Verify that he is taking his potassium.  If not, he needs to start taking it again.  If he is, please find out how many he is taking and I will adjust the dose accordingly.   1500 per patient he is taking Potassium 10 meg bid and has not missed any doses. / S. Mercy Riding, RN   [phone] Patient notified to increase Potassium to TID dosing.  Verbalizes understanding./ S. Mercy Riding, RN

## 2011-09-25 NOTE — Patient Instructions (Signed)
[facility]  Discharge Instructions  RECOMMENDATIONS MADE BY THE CONSULTANT AND ANY TEST RESULTS WILL BE SENT TO YOUR REFERRING DOCTOR.   EXAM FINDINGS BY MD TODAY AND SIGNS AND SYMPTOMS TO REPORT TO CLINIC OR PRIMARY MD: Per Jenita Seashore PA  SPECIAL INSTRUCTIONS/FOLLOW-UP: Other (Referral/Appointments)  Return to see [place] in 6 months with lab work before visit.   I acknowledge that I have been informed and understand all the instructions given to me and received a copy. I do not have any more questions at this time, but understand that I may call the Specialty Clinic at [facility] at [phone] during business hours should I have any further questions or need assistance in obtaining follow-up care.    __________________________________________  _____________  __________ Signature of Patient or Authorized Representative            Date                   Time    __________________________________________ Nurse's Signature

## 2011-09-25 NOTE — Telephone Encounter (Signed)
Notes Recorded by Dellis Anes, PA on 09/25/2011 at 1:47 PM Verify that he is taking his potassium.  If not, he needs to start taking it again.  If he is, please find out how many he is taking and I will adjust the dose accordingly.   1500 per patient he is taking Potassium 10 meg bid and has not missed any doses. / S. Mercy Riding, RN

## 2011-09-28 ENCOUNTER — Other Ambulatory Visit (HOSPITAL_COMMUNITY): Payer: Self-pay | Admitting: Oncology

## 2011-10-06 ENCOUNTER — Ambulatory Visit (HOSPITAL_COMMUNITY): Payer: Medicare Other

## 2011-10-07 ENCOUNTER — Other Ambulatory Visit (HOSPITAL_COMMUNITY): Payer: Self-pay | Admitting: Oncology

## 2011-10-07 ENCOUNTER — Encounter (HOSPITAL_BASED_OUTPATIENT_CLINIC_OR_DEPARTMENT_OTHER): Payer: Medicare Other

## 2011-10-07 VITALS — BP 173/104 | HR 91 | Temp 97.6°F

## 2011-10-07 DIAGNOSIS — D509 Iron deficiency anemia, unspecified: Secondary | ICD-10-CM

## 2011-10-07 MED ORDER — SODIUM CHLORIDE 0.9 % IV SOLN
INTRAVENOUS | Status: DC
  Administered 2011-10-07: 12:00:00 via INTRAVENOUS

## 2011-10-07 MED ORDER — SODIUM CHLORIDE 0.9 % IV SOLN: 1020.0000 mg | Freq: Once | INTRAVENOUS | Status: DC

## 2011-10-07 MED ORDER — SODIUM CHLORIDE 0.9 % IJ SOLN
10.0000 mL | INTRAMUSCULAR | Status: DC | PRN
  Administered 2011-10-07: 10 mL via INTRAVENOUS
  Filled 2011-10-07: qty 10

## 2011-10-07 MED ORDER — SODIUM CHLORIDE 0.9 % IJ SOLN
INTRAMUSCULAR | Status: AC
  Administered 2011-10-07: 10 mL via INTRAVENOUS
  Filled 2011-10-07: qty 10

## 2011-10-07 MED ORDER — SODIUM CHLORIDE 0.9 % IV SOLN
1020.0000 mg | Freq: Once | INTRAVENOUS | Status: AC
  Administered 2011-10-07: 1020 mg via INTRAVENOUS
  Filled 2011-10-07: qty 34

## 2011-10-07 NOTE — Progress Notes (Signed)
Tolerated Fereheme infusion well.  Cards to check for occult blood in stool x 3 given to pt with instructions on how to obtain samples to take home.

## 2011-10-16 ENCOUNTER — Encounter (HOSPITAL_COMMUNITY): Payer: Medicare Other | Attending: Oncology

## 2011-10-16 DIAGNOSIS — R5381 Other malaise: Secondary | ICD-10-CM | POA: Insufficient documentation

## 2011-10-16 DIAGNOSIS — I10 Essential (primary) hypertension: Secondary | ICD-10-CM | POA: Insufficient documentation

## 2011-10-16 DIAGNOSIS — D509 Iron deficiency anemia, unspecified: Secondary | ICD-10-CM | POA: Insufficient documentation

## 2011-10-16 DIAGNOSIS — C189 Malignant neoplasm of colon, unspecified: Secondary | ICD-10-CM | POA: Insufficient documentation

## 2011-10-16 DIAGNOSIS — G609 Hereditary and idiopathic neuropathy, unspecified: Secondary | ICD-10-CM | POA: Insufficient documentation

## 2011-10-16 DIAGNOSIS — Z79899 Other long term (current) drug therapy: Secondary | ICD-10-CM | POA: Insufficient documentation

## 2011-10-16 DIAGNOSIS — R5383 Other fatigue: Secondary | ICD-10-CM | POA: Insufficient documentation

## 2011-10-16 LAB — OCCULT BLOOD X 1 CARD TO LAB, STOOL: Fecal Occult Bld: NEGATIVE

## 2011-11-06 ENCOUNTER — Encounter (HOSPITAL_COMMUNITY): Payer: Medicare Other | Attending: Oncology

## 2011-11-06 ENCOUNTER — Other Ambulatory Visit (HOSPITAL_COMMUNITY): Payer: Self-pay | Admitting: Oncology

## 2011-11-06 ENCOUNTER — Telehealth (HOSPITAL_COMMUNITY): Payer: Self-pay | Admitting: *Deleted

## 2011-11-06 DIAGNOSIS — C185 Malignant neoplasm of splenic flexure: Secondary | ICD-10-CM

## 2011-11-06 DIAGNOSIS — D509 Iron deficiency anemia, unspecified: Secondary | ICD-10-CM

## 2011-11-06 LAB — CBC
HCT: 42.8 % (ref 39.0–52.0)
MCH: 27.3 pg (ref 26.0–34.0)
MCHC: 31.8 g/dL (ref 30.0–36.0)
MCV: 85.8 fL (ref 78.0–100.0)
Platelets: 178 10*3/uL (ref 150–400)
RDW: 13.6 % (ref 11.5–15.5)

## 2011-11-06 MED ORDER — POLYSACCHARIDE IRON COMPLEX 150 MG PO CAPS: 150.0000 mg | ORAL_CAPSULE | Freq: Two times a day (BID) | ORAL | Status: DC

## 2011-11-06 NOTE — Progress Notes (Signed)
Francisco Johnston presented for labwork. Labs per MD order drawn via Peripheral Line 25 gauge needle inserted in rt arm  Good blood return present. Procedure without incident.  Needle removed intact. Patient tolerated procedure well.

## 2011-11-06 NOTE — Telephone Encounter (Signed)
Please call in Rx Niferex 150 mg # 60 with 2 refills.  Take 2 daily.  [facility] [place]

## 2011-11-06 NOTE — Telephone Encounter (Signed)
KEFALAS,THOMAS, PA 11/06/2011 4:29 PM Signed  Please call in Rx Niferex 150 mg # 60 with 2 refills. Take 2 daily. [facility] [place]  11/06/11 1834 - Called to [facility]

## 2012-01-07 ENCOUNTER — Encounter (HOSPITAL_COMMUNITY): Payer: Medicare Other | Attending: Oncology

## 2012-01-07 DIAGNOSIS — D509 Iron deficiency anemia, unspecified: Secondary | ICD-10-CM | POA: Insufficient documentation

## 2012-01-07 LAB — CBC
MCH: 28.3 pg (ref 26.0–34.0)
MCHC: 33.2 g/dL (ref 30.0–36.0)
MCV: 85.2 fL (ref 78.0–100.0)
Platelets: 154 10*3/uL (ref 150–400)

## 2012-01-07 LAB — FERRITIN: Ferritin: 202 ng/mL (ref 22–322)

## 2012-02-16 ENCOUNTER — Other Ambulatory Visit (HOSPITAL_COMMUNITY): Payer: Self-pay | Admitting: Oncology

## 2012-02-16 NOTE — Progress Notes (Signed)
Rx refill for Francisco Johnston is denied today.  I wish to stop the medication and see if he maintains his Hgb.  His Hgb improved while on the medication, but I think it may be worthy to stop the medication at this time and see if he maintains.  Will perform lab work in April as planned.  KEFALAS,THOMAS

## 2012-03-16 NOTE — Progress Notes (Signed)
Labs drawn today

## 2012-03-25 ENCOUNTER — Other Ambulatory Visit (HOSPITAL_COMMUNITY): Payer: Medicare Other

## 2012-03-29 ENCOUNTER — Encounter (HOSPITAL_COMMUNITY): Payer: Medicare Other | Attending: Oncology | Admitting: Oncology

## 2012-03-29 ENCOUNTER — Telehealth (HOSPITAL_COMMUNITY): Payer: Self-pay | Admitting: *Deleted

## 2012-03-29 ENCOUNTER — Encounter (HOSPITAL_COMMUNITY): Payer: Self-pay | Admitting: Oncology

## 2012-03-29 VITALS — BP 212/97 | HR 50 | Temp 98.5°F | Ht 71.5 in | Wt 157.1 lb

## 2012-03-29 DIAGNOSIS — I10 Essential (primary) hypertension: Secondary | ICD-10-CM | POA: Insufficient documentation

## 2012-03-29 DIAGNOSIS — Z7982 Long term (current) use of aspirin: Secondary | ICD-10-CM | POA: Insufficient documentation

## 2012-03-29 DIAGNOSIS — G609 Hereditary and idiopathic neuropathy, unspecified: Secondary | ICD-10-CM | POA: Insufficient documentation

## 2012-03-29 DIAGNOSIS — R5383 Other fatigue: Secondary | ICD-10-CM

## 2012-03-29 DIAGNOSIS — I519 Heart disease, unspecified: Secondary | ICD-10-CM | POA: Insufficient documentation

## 2012-03-29 DIAGNOSIS — Z8546 Personal history of malignant neoplasm of prostate: Secondary | ICD-10-CM | POA: Insufficient documentation

## 2012-03-29 DIAGNOSIS — Z91199 Patient's noncompliance with other medical treatment and regimen due to unspecified reason: Secondary | ICD-10-CM | POA: Insufficient documentation

## 2012-03-29 DIAGNOSIS — R5381 Other malaise: Secondary | ICD-10-CM

## 2012-03-29 DIAGNOSIS — C189 Malignant neoplasm of colon, unspecified: Secondary | ICD-10-CM | POA: Insufficient documentation

## 2012-03-29 DIAGNOSIS — I252 Old myocardial infarction: Secondary | ICD-10-CM | POA: Insufficient documentation

## 2012-03-29 DIAGNOSIS — C185 Malignant neoplasm of splenic flexure: Secondary | ICD-10-CM

## 2012-03-29 DIAGNOSIS — E876 Hypokalemia: Secondary | ICD-10-CM

## 2012-03-29 DIAGNOSIS — D509 Iron deficiency anemia, unspecified: Secondary | ICD-10-CM | POA: Insufficient documentation

## 2012-03-29 DIAGNOSIS — Z9119 Patient's noncompliance with other medical treatment and regimen: Secondary | ICD-10-CM | POA: Insufficient documentation

## 2012-03-29 LAB — COMPREHENSIVE METABOLIC PANEL
ALT: 12 U/L (ref 0–53)
AST: 18 U/L (ref 0–37)
CO2: 33 mEq/L — ABNORMAL HIGH (ref 19–32)
Calcium: 9.4 mg/dL (ref 8.4–10.5)
Chloride: 97 mEq/L (ref 96–112)
Creatinine, Ser: 0.81 mg/dL (ref 0.50–1.35)
GFR calc Af Amer: >90 mL/min (ref >90)
GFR calc non Af Amer: 85 mL/min — ABNORMAL LOW (ref >90)
Glucose, Bld: 158 mg/dL — ABNORMAL HIGH (ref 70–99)
Total Bilirubin: 0.5 mg/dL (ref 0.3–1.2)

## 2012-03-29 LAB — CBC
HCT: 42.3 % (ref 39.0–52.0)
Hemoglobin: 13.8 g/dL (ref 13.0–17.0)
MCH: 27.7 pg (ref 26.0–34.0)
MCHC: 32.6 g/dL (ref 30.0–36.0)
MCV: 84.8 fL (ref 78.0–100.0)

## 2012-03-29 LAB — DIFFERENTIAL
Basophils Relative: 0 % (ref 0–1)
Eosinophils Relative: 1 % (ref 0–5)
Monocytes Absolute: 0.3 10*3/uL (ref 0.1–1.0)
Monocytes Relative: 5 % (ref 3–12)
Neutro Abs: 4.5 10*3/uL (ref 1.7–7.7)

## 2012-03-29 LAB — FERRITIN: Ferritin: 208 ng/mL (ref 22–322)

## 2012-03-29 NOTE — Telephone Encounter (Signed)
Message copied by Dennie Maizes on Tue Mar 29, 2012  2:07 PM ------      Message from: Ellouise Newer III      Created: Tue Mar 29, 2012  1:38 PM       He needs potassium 20 meq BID x 14 days and then daily. Please call in.            Recheck Potassium and Magnesium in 30 days

## 2012-03-29 NOTE — Telephone Encounter (Signed)
Left message on answering machine for pt to call clinic.

## 2012-03-29 NOTE — Patient Instructions (Signed)
Francisco Johnston  [id number] 25-Apr-1934 Dr. Glenford Peers  [facility]  Discharge Instructions  RECOMMENDATIONS MADE BY THE CONSULTANT AND ANY TEST RESULTS WILL BE SENT TO YOUR REFERRING DOCTOR.   EXAM FINDINGS BY MD TODAY AND SIGNS AND SYMPTOMS TO REPORT TO CLINIC OR PRIMARY MD: Lab work today. We will call you if there are any abnormal results.   MEDICATIONS PRESCRIBED: none   INSTRUCTIONS GIVEN AND DISCUSSED: Return to clinic in 3 months and 6 months for lab work. CT scan in 6 months.   SPECIAL INSTRUCTIONS/FOLLOW-UP :Return to see MD in 6 months.    I acknowledge that I have been informed and understand all the instructions given to me and received a copy. I do not have any more questions at this time, but understand that I may call the Specialty Clinic at [facility] at [phone] during business hours should I have any further questions or need assistance in obtaining follow-up care.    __________________________________________  _____________  __________ Signature of Patient or Authorized Representative            Date                   Time    __________________________________________ Nurse's Signature

## 2012-03-29 NOTE — Progress Notes (Signed)
No primary provider on file. No primary provider on file.  1. Adenocarcinoma of colon  Ferritin, CBC, Differential, Basic metabolic panel, CEA, Ferritin, CT Abdomen Pelvis W Contrast, CT Chest W Contrast, Ferritin, CBC, Differential, CEA, Comprehensive metabolic panel  2. Iron deficiency anemia  Ferritin, Ferritin, Ferritin  3. Hypokalemia  Basic metabolic panel, Magnesium    CURRENT THERAPY:Status post surgery on 07/09/2010 with 1 positive lymph node. Patient was too weak and too disabled to undergo adjuvant chemotherapy.   INTERVAL HISTORY: Francisco Johnston 78 y.o. 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   The nurse notes that the patient has increased blood pressure during the triage process. On further questioning, it is noted the patient has not been taking any medication except for aspirin for a questionable amount of time. After a few moments of the patient resting, the nurse repeat his blood pressure and she ascertain the numbers of 212/90.   The patient denies any complaints. We spent some time discussing his blood pressure. This was repeated at the very end of our visit today. I ascertain a manual blood pressure 170/105. I strongly encourage the patient to follow up with his primary care physician regarding his blood pressure. He likely has not been taking any blood pressure medication. He tells me he will certainly followup of Francisco Johnston with a telephone call today.  In regards to his colon cancer, the patient denies any complaints. He denies any B. symptoms. His appetite remains strong. He denies any change in his stool patterns, caliber, consistency, or color. He denies any blood in his stools. At this time we will discontinue his Niferex. He was initially iron deficient at time of diagnosis. This is likely secondary to his colon cancer. We'll give him a trial with no oral iron and see how his iron  stores maintain. We can certainly reinitiate this medication if necessary. We'll monitor his ferritin with his lab work.  Patient reports that his wife is ailing.  Fortunately, he has 2 sons who live close by who helps take care of him. The patient is disappointed that he is unable to maintain a garden. He reports that ever since he was a child he had a garden which he maintained and managed. Unfortunately, do to his comorbidities, and age, and fatigue, he is unable to have a garden.  The patient was given have laboratory work on Friday. The patient failed report for this appointment. Therefore, we will ascertain laboratory work today.  Past Medical History  Diagnosis Date  . Heart disease   . Hypertension   . Cancer     colon  . Adenocarcinoma of colon 06/12/2011  . Iron deficiency anemia 06/12/2011    has Adenocarcinoma of colon and Iron deficiency anemia on his problem list.      has no known allergies.  Francisco Johnston does not currently have medications on file.  Past Surgical History  Procedure Date  . Colectomy   . Colonoscopy     Denies any headaches, dizziness, double vision, fevers, chills, night sweats, nausea, vomiting, diarrhea, constipation, chest pain, heart palpitations, shortness of breath, blood in stool, black tarry stool, urinary pain, urinary burning, urinary frequency, hematuria.   PHYSICAL EXAMINATION  ECOG PERFORMANCE STATUS: 2 - Symptomatic, <50% confined to bed  Filed Vitals:   03/29/12 1000  BP: 212/97  Pulse: 50  Temp:     GENERAL:alert, no distress, well  nourished, well developed, comfortable, cooperative, smiling and rolling walker nearby SKIN: skin color, texture, turgor are normal, no rashes or significant lesions HEAD: Normocephalic, No masses, lesions, tenderness or abnormalities EYES: normal, EOMI, Conjunctiva are pink and non-injected EARS: External ears normal OROPHARYNX:lips, buccal mucosa, and tongue normal and mucous membranes are moist    NECK: supple, no adenopathy, thyroid normal size, non-tender, without nodularity, no stridor, non-tender, trachea midline LYMPH:  no palpable lymphadenopathy, no hepatosplenomegaly BREAST:not examined LUNGS: clear to auscultation and percussion HEART: regular rate & rhythm, no murmurs, no gallops, S1 normal and S2 normal ABDOMEN:abdomen soft, non-tender, normal bowel sounds, no masses or organomegaly and no hepatosplenomegaly BACK: Back symmetric, no curvature. EXTREMITIES:less then 2 second capillary refill, no joint deformities, effusion, or inflammation, no edema, no skin discoloration, no clubbing, no cyanosis  NEURO: alert & oriented x 3 with fluent speech, no focal motor/sensory deficits, gait normal   LABORATORY DATA: CBC    Component Value Date/Time   WBC 6.0 03/29/2012 1102   RBC 4.99 03/29/2012 1102   HGB 13.8 03/29/2012 1102   HCT 42.3 03/29/2012 1102   PLT 169 03/29/2012 1102   MCV 84.8 03/29/2012 1102   MCH 27.7 03/29/2012 1102   MCHC 32.6 03/29/2012 1102   RDW 12.4 03/29/2012 1102   LYMPHSABS 1.2 03/29/2012 1102   MONOABS 0.3 03/29/2012 1102   EOSABS 0.1 03/29/2012 1102   BASOSABS 0.0 03/29/2012 1102      Chemistry      Component Value Date/Time   NA 139 03/29/2012 1102   K 2.9* 03/29/2012 1102   CL 97 03/29/2012 1102   CO2 33* 03/29/2012 1102   BUN 8 03/29/2012 1102   CREATININE 0.81 03/29/2012 1102      Component Value Date/Time   CALCIUM 9.4 03/29/2012 1102   ALKPHOS 114 03/29/2012 1102   AST 18 03/29/2012 1102   ALT 12 03/29/2012 1102   BILITOT 0.5 03/29/2012 1102     Lab Results  Component Value Date   FERRITIN 202 01/07/2012   Lab Results  Component Value Date   CEA 1.8 09/25/2011      PENDING LABS:  Ferritin    ASSESSMENT:  1. 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 Severe weakness and fatigue  3. Peripheral neuropathy of the right leg which writing of the right foot  4. History of myocardial infarctions in 1970 and 1982  5.  Hypertension, uncontrolled due to noncompliance.  6. Hypokalemia, we will evaluate today.  7. Severe iron deficient anemia at presentation with a hemoglobin of 4 and a ferritin of 2.  8. History of prostate cancer status post radiation therapy in 2003 by Dr. Chipper Herb.    PLAN:  1. Strongly encouraged the patient to follow-up with PCP regarding his HTN.  He reports he will call him today.  I contacted Dr. Constance Haw office and they are aware of his BP and they will try to reach out to the patient. 2. I personally reviewed and went over laboratory results with the patient. 3. Lab work today: Cbc diff, CMET, CEA, Ferritin. 4. Lab work in 3 months and 6 months: CBC diff, CMET/BMET, CEA, Ferritin 5. CT CAP with contrast in October 2013 for restaging. 6. Return in 6 months for follow-up.    All questions were answered. The patient knows to call the clinic with any problems, questions or concerns. We can certainly see the patient much sooner if necessary.  The patient and plan  discussed with Glenford Peers, MD and he is in agreement with the aforementioned.  Masoud Nyce

## 2012-03-29 NOTE — Telephone Encounter (Signed)
Spoke with pt as below. RX called to [facility]. Pt verbalized understanding.

## 2012-04-26 ENCOUNTER — Encounter (HOSPITAL_COMMUNITY): Payer: Medicare Other | Attending: Oncology

## 2012-04-26 DIAGNOSIS — E876 Hypokalemia: Secondary | ICD-10-CM | POA: Insufficient documentation

## 2012-04-26 LAB — BASIC METABOLIC PANEL
BUN: 20 mg/dL (ref 6–23)
Creatinine, Ser: 1.29 mg/dL (ref 0.50–1.35)
GFR calc Af Amer: 61 mL/min — ABNORMAL LOW (ref >90)
GFR calc non Af Amer: 53 mL/min — ABNORMAL LOW (ref >90)
Potassium: 4 mEq/L (ref 3.5–5.1)

## 2012-04-26 LAB — MAGNESIUM: Magnesium: 1.9 mg/dL (ref 1.5–2.5)

## 2012-04-26 NOTE — Progress Notes (Signed)
Labs drawn today for bmp,mg 

## 2012-06-28 ENCOUNTER — Other Ambulatory Visit (HOSPITAL_COMMUNITY): Payer: Self-pay

## 2012-06-28 ENCOUNTER — Encounter (HOSPITAL_COMMUNITY): Payer: Self-pay

## 2012-06-28 ENCOUNTER — Encounter (HOSPITAL_COMMUNITY): Payer: Medicare Other | Attending: Oncology

## 2012-06-28 DIAGNOSIS — C185 Malignant neoplasm of splenic flexure: Secondary | ICD-10-CM

## 2012-06-28 DIAGNOSIS — E876 Hypokalemia: Secondary | ICD-10-CM

## 2012-06-28 LAB — DIFFERENTIAL
Basophils Absolute: 0 10*3/uL (ref 0.0–0.1)
Basophils Relative: 0 % (ref 0–1)
Eosinophils Absolute: 0.1 10*3/uL (ref 0.0–0.7)
Eosinophils Relative: 1 % (ref 0–5)
Neutrophils Relative %: 71 % (ref 43–77)

## 2012-06-28 LAB — CBC
MCH: 28.3 pg (ref 26.0–34.0)
MCHC: 33.9 g/dL (ref 30.0–36.0)
Platelets: 165 10*3/uL (ref 150–400)

## 2012-06-28 LAB — COMPREHENSIVE METABOLIC PANEL
AST: 18 U/L (ref 0–37)
Albumin: 4.2 g/dL (ref 3.5–5.2)
Calcium: 9.8 mg/dL (ref 8.4–10.5)
Creatinine, Ser: 0.86 mg/dL (ref 0.50–1.35)
GFR calc non Af Amer: 83 mL/min — ABNORMAL LOW (ref >90)
Total Protein: 8 g/dL (ref 6.0–8.3)

## 2012-06-28 MED ORDER — POTASSIUM CHLORIDE CRYS ER 20 MEQ PO TBCR: 20.0000 meq | EXTENDED_RELEASE_TABLET | Freq: Two times a day (BID) | ORAL | Status: DC

## 2012-06-28 NOTE — Progress Notes (Signed)
Labs drawn today for cbc/diff,cmp,cea,ferr 

## 2012-06-29 ENCOUNTER — Telehealth (HOSPITAL_COMMUNITY): Payer: Self-pay

## 2012-06-29 LAB — CEA: CEA: 2 ng/mL (ref 0.0–5.0)

## 2012-06-29 LAB — FERRITIN: Ferritin: 339 ng/mL — ABNORMAL HIGH (ref 22–322)

## 2012-06-29 NOTE — Telephone Encounter (Signed)
Message copied by Evelena Leyden on Wed Jun 29, 2012 12:39 PM ------      Message from: Ellouise Newer III      Created: Wed Jun 29, 2012 12:13 PM       From [place]      ----- Message -----         From: Randall An, MD         Sent: 06/29/2012  10:54 AM           To: Ellouise Newer, PA, Onc Nurse Ap            He can stop his iron pill

## 2012-06-29 NOTE — Telephone Encounter (Signed)
Patient notified to stop the niferex.  Verbalized understanding.

## 2012-07-13 ENCOUNTER — Other Ambulatory Visit (HOSPITAL_COMMUNITY): Payer: Medicare Other

## 2012-09-27 ENCOUNTER — Ambulatory Visit (HOSPITAL_COMMUNITY): Payer: Medicare Other

## 2012-09-27 ENCOUNTER — Other Ambulatory Visit (HOSPITAL_COMMUNITY): Payer: Medicare Other

## 2012-09-28 ENCOUNTER — Telehealth (HOSPITAL_COMMUNITY): Payer: Self-pay

## 2012-09-28 ENCOUNTER — Encounter (HOSPITAL_COMMUNITY): Payer: Medicare Other | Attending: Oncology | Admitting: Oncology

## 2012-09-28 VITALS — BP 130/61 | HR 80 | Temp 98.3°F | Resp 18 | Wt 151.0 lb

## 2012-09-28 DIAGNOSIS — R5383 Other fatigue: Secondary | ICD-10-CM | POA: Insufficient documentation

## 2012-09-28 DIAGNOSIS — G609 Hereditary and idiopathic neuropathy, unspecified: Secondary | ICD-10-CM | POA: Insufficient documentation

## 2012-09-28 DIAGNOSIS — C185 Malignant neoplasm of splenic flexure: Secondary | ICD-10-CM

## 2012-09-28 DIAGNOSIS — C779 Secondary and unspecified malignant neoplasm of lymph node, unspecified: Secondary | ICD-10-CM

## 2012-09-28 DIAGNOSIS — C189 Malignant neoplasm of colon, unspecified: Secondary | ICD-10-CM

## 2012-09-28 DIAGNOSIS — E876 Hypokalemia: Secondary | ICD-10-CM

## 2012-09-28 DIAGNOSIS — R5381 Other malaise: Secondary | ICD-10-CM | POA: Insufficient documentation

## 2012-09-28 DIAGNOSIS — I10 Essential (primary) hypertension: Secondary | ICD-10-CM | POA: Insufficient documentation

## 2012-09-28 DIAGNOSIS — D509 Iron deficiency anemia, unspecified: Secondary | ICD-10-CM | POA: Insufficient documentation

## 2012-09-28 LAB — DIFFERENTIAL
Basophils Absolute: 0 10*3/uL (ref 0.0–0.1)
Eosinophils Relative: 1 % (ref 0–5)
Lymphocytes Relative: 21 % (ref 12–46)
Lymphs Abs: 1.4 10*3/uL (ref 0.7–4.0)
Neutro Abs: 4.7 10*3/uL (ref 1.7–7.7)
Neutrophils Relative %: 72 % (ref 43–77)

## 2012-09-28 LAB — BASIC METABOLIC PANEL
CO2: 30 mEq/L (ref 19–32)
Chloride: 97 mEq/L (ref 96–112)
Glucose, Bld: 108 mg/dL — ABNORMAL HIGH (ref 70–99)
Potassium: 2.7 mEq/L — CL (ref 3.5–5.1)
Sodium: 138 mEq/L (ref 135–145)

## 2012-09-28 LAB — CBC
MCV: 83.6 fL (ref 78.0–100.0)
Platelets: 172 10*3/uL (ref 150–400)
RBC: 5.18 MIL/uL (ref 4.22–5.81)
RDW: 12 % (ref 11.5–15.5)
WBC: 6.5 10*3/uL (ref 4.0–10.5)

## 2012-09-28 LAB — MAGNESIUM: Magnesium: 1.8 mg/dL (ref 1.5–2.5)

## 2012-09-28 LAB — FERRITIN: Ferritin: 232 ng/mL (ref 22–322)

## 2012-09-28 MED ORDER — POTASSIUM CHLORIDE CRYS ER 20 MEQ PO TBCR: EXTENDED_RELEASE_TABLET | ORAL | Status: DC

## 2012-09-28 MED ORDER — POTASSIUM CHLORIDE CRYS ER 20 MEQ PO TBCR: 20.0000 meq | EXTENDED_RELEASE_TABLET | Freq: Two times a day (BID) | ORAL | Status: DC

## 2012-09-28 NOTE — Telephone Encounter (Signed)
CRITICAL VALUE ALERT Critical value received:  Potassium level of 2.7 Date of notification:  09/28/12  Time of notification: 1108 Critical value read back:  yes Nurse who received alert:  Tobie Lords, RN Dellis Anes , PA notified @ [phone].

## 2012-09-28 NOTE — Patient Instructions (Addendum)
[facility]  Discharge Instructions  RECOMMENDATIONS MADE BY THE CONSULTANT AND ANY TEST RESULTS WILL BE SENT TO YOUR REFERRING DOCTOR.   You are losing weight. We would like for you to start drinking Ensure or Boost. Try to drink at least 3 cans a day.  We will schedule you for a CT scan this month here at [facility].  We would like for you to come back here in 3 months and 6 months for lab work.  We want you to see the doctor again in 6 months.   I acknowledge that I have been informed and understand all the instructions given to me and received a copy. I do not have any more questions at this time, but understand that I may call the Specialty Clinic at [facility] at [phone] during business hours should I have any further questions or need assistance in obtaining follow-up care.    __________________________________________  _____________  __________ Signature of Patient or Authorized Representative            Date                   Time    __________________________________________ Nurse's Signature

## 2012-09-28 NOTE — Progress Notes (Signed)
[name], MD [address] [place] [address]  1. Adenocarcinoma of colon     CURRENT THERAPY:Status post surgery on 07/09/2010 with 1 positive lymph node. Patient was too weak and too disabled to undergo adjuvant chemotherapy   INTERVAL HISTORY: Francisco Johnston 79 y.o. 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  Status post surgery on 07/09/2010 with 1 positive lymph node. Patient was too weak and too disabled to undergo adjuvant chemotherapy.  Francisco Johnston, unfortunately missed his surveillance CT that is ordered annually.  He is apologetic for this missed appointment.  So we will try to get him scheduled for another CT scan this month or next.  He is due to get lab work performed today and those orders are placed.    Francisco Johnston is doing well.  He is busy caring for his wife who is in the car due to her inability to ambulate to the clinic.  He reports that his wife has "sugar" issues and feels the effects of this disease.   He denies any complaints with his bowels including blood in stool, black tarry stool, pencil thin stools, diarrhea, constipation, abdominal pain.  He denies any rectal pain as well.    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  He does chew snuff daily.  So we will set him up for a CT scan in the near future for surveillance.  We will perform lab work in 3 months and 6 months and see him back.  I have refilled his Kdur.   Review of his vitals reveal that he has lost weight.  I have asked my nurse to encourage him to consume ensure TID.  Will provide him free samples of ensure if available.    Past Medical History  Diagnosis Date  . Heart disease   . Hypertension   . Cancer     colon  . Adenocarcinoma of colon 06/12/2011  . Iron deficiency anemia 06/12/2011    has Adenocarcinoma of colon and Iron  deficiency anemia on his problem list.      has no known allergies.  Francisco Johnston does not currently have medications on file.  Past Surgical History  Procedure Date  . Colectomy   . Colonoscopy     Denies any headaches, dizziness, double vision, fevers, chills, night sweats, nausea, vomiting, diarrhea, constipation, chest pain, heart palpitations, shortness of breath, blood in stool, black tarry stool, urinary pain, urinary burning, urinary frequency, hematuria.   PHYSICAL EXAMINATION  ECOG PERFORMANCE STATUS: 2 - Symptomatic, <50% confined to bed  Filed Vitals:   09/28/12 0900  BP: 130/61  Pulse: 80  Temp: 98.3 F (36.8 C)  Resp: 18    GENERAL:alert, no distress, well developed, cachectic, comfortable, cooperative, smiling and edentulous SKIN: skin color, texture, turgor are normal, no rashes or significant lesions HEAD: Normocephalic, No masses, lesions, tenderness or abnormalities EYES: normal, Conjunctiva are pink and non-injected EARS: External ears normal OROPHARYNX:lips, buccal mucosa, and tongue normal, edentulous, mucous membranes are moist and no lesions or masses palpated.  Halitosis appreciated NECK: supple, no adenopathy, thyroid normal size, non-tender, without nodularity, no stridor, non-tender, trachea midline LYMPH:  no palpable lymphadenopathy, no hepatosplenomegaly, liver edge palpated on deep inspiration, but not enlarged. BREAST:not examined LUNGS: clear to auscultation and percussion HEART: regular rate &  rhythm, no murmurs, no gallops, S1 normal and S2 normal ABDOMEN:abdomen soft, non-tender, normal bowel sounds, no masses or organomegaly and no hepatosplenomegaly BACK: Back symmetric, no curvature., No CVA tenderness EXTREMITIES:less then 2 second capillary refill, no joint deformities, effusion, or inflammation, no edema, no skin discoloration, no clubbing, no cyanosis  NEURO: alert & oriented x 3 with fluent speech, no focal motor/sensory deficits,  gait supported with rolling walker due to instability.    LABORATORY DATA: CBC    Component Value Date/Time   WBC 7.5 06/28/2012 0940   RBC 5.20 06/28/2012 0940   HGB 14.7 06/28/2012 0940   HCT 43.4 06/28/2012 0940   PLT 165 06/28/2012 0940   MCV 83.5 06/28/2012 0940   MCH 28.3 06/28/2012 0940   MCHC 33.9 06/28/2012 0940   RDW 12.1 06/28/2012 0940   LYMPHSABS 1.6 06/28/2012 0940   MONOABS 0.5 06/28/2012 0940   EOSABS 0.1 06/28/2012 0940   BASOSABS 0.0 06/28/2012 0940      Chemistry      Component Value Date/Time   NA 137 06/28/2012 0956   K 2.8* 06/28/2012 0956   CL 96 06/28/2012 0956   CO2 30 06/28/2012 0956   BUN 10 06/28/2012 0956   CREATININE 0.86 06/28/2012 0956      Component Value Date/Time   CALCIUM 9.8 06/28/2012 0956   ALKPHOS 100 06/28/2012 0956   AST 18 06/28/2012 0956   ALT 13 06/28/2012 0956   BILITOT 0.9 06/28/2012 0956     Lab Results  Component Value Date   CEA 2.0 06/28/2012       ASSESSMENT:  1. 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 Severe weakness and fatigue  3. Peripheral neuropathy of the right leg which writing of the right foot  4. History of myocardial infarctions in 1970 and 1982  5. Hypertension, uncontrolled due to noncompliance.  6. Hypokalemia, we will evaluate today.  7. Severe iron deficient anemia at presentation with a hemoglobin of 4 and a ferritin of 2.  8. History of prostate cancer status post radiation therapy in 2003 by Dr. Chipper Herb.    PLAN:  1. I personally reviewed and went over laboratory results with the patient but these are old results from July.  He was hypokalemic at that time.  2. Refill on Kdur 20 mEq BID with 1 refill. 3. Lab work today as scheduled.  4. Lab work in 3 months and 6 months: Cbc diff, CMET, CEA, Ferritin. 5. CT of CAP with contrast this month or next.  Will send for prior authorization.  The role of the CT of chest is due to an enlarged mediastinal lymph node in 2011 and  recommended follow-up on that.  5. Nurse relayed encouragement of nutrition and meal supplementation with ensure.  Free samples provide.  6. Return in 6 months for follow-up.   All questions were answered. The patient knows to call the clinic with any problems, questions or concerns. We can certainly see the patient much sooner if necessary.  The patient and plan discussed with Francisco Peers, MD and he is in agreement with the aforementioned.  Francisco Johnston  Addendum: Received critical K+ level on patient at 2.7.  I added a Mg level and called his pharmacy to change dosing and frequency to reflect change.   Francisco Johnston

## 2012-09-29 LAB — CEA: CEA: 1.5 ng/mL (ref 0.0–5.0)

## 2012-10-11 ENCOUNTER — Ambulatory Visit (HOSPITAL_COMMUNITY)
Admission: RE | Admit: 2012-10-11 | Discharge: 2012-10-11 | Disposition: A | Discharge status: Home / Self Care | Payer: Medicare Other | Source: Ambulatory Visit | Attending: Oncology | Admitting: Oncology

## 2012-10-11 DIAGNOSIS — Z9221 Personal history of antineoplastic chemotherapy: Secondary | ICD-10-CM | POA: Insufficient documentation

## 2012-10-11 DIAGNOSIS — C189 Malignant neoplasm of colon, unspecified: Secondary | ICD-10-CM | POA: Insufficient documentation

## 2012-10-11 DIAGNOSIS — R599 Enlarged lymph nodes, unspecified: Secondary | ICD-10-CM | POA: Insufficient documentation

## 2012-10-11 MED ORDER — IOHEXOL 300 MG/ML  SOLN
100.0000 mL | Freq: Once | INTRAMUSCULAR | Status: AC | PRN
  Administered 2012-10-11: 100 mL via INTRAVENOUS

## 2012-12-26 ENCOUNTER — Other Ambulatory Visit (HOSPITAL_COMMUNITY): Payer: Self-pay | Admitting: Oncology

## 2012-12-26 ENCOUNTER — Encounter (HOSPITAL_COMMUNITY): Payer: Medicare Other | Attending: Oncology

## 2012-12-26 DIAGNOSIS — C189 Malignant neoplasm of colon, unspecified: Secondary | ICD-10-CM

## 2012-12-26 DIAGNOSIS — D509 Iron deficiency anemia, unspecified: Secondary | ICD-10-CM

## 2012-12-26 DIAGNOSIS — E876 Hypokalemia: Secondary | ICD-10-CM | POA: Insufficient documentation

## 2012-12-26 DIAGNOSIS — C779 Secondary and unspecified malignant neoplasm of lymph node, unspecified: Secondary | ICD-10-CM

## 2012-12-26 DIAGNOSIS — C185 Malignant neoplasm of splenic flexure: Secondary | ICD-10-CM

## 2012-12-26 LAB — CBC WITH DIFFERENTIAL/PLATELET
Basophils Absolute: 0 10*3/uL (ref 0.0–0.1)
HCT: 43.7 % (ref 39.0–52.0)
Lymphocytes Relative: 32 % (ref 12–46)
Lymphs Abs: 1.9 10*3/uL (ref 0.7–4.0)
Monocytes Absolute: 0.3 10*3/uL (ref 0.1–1.0)
Neutro Abs: 3.6 10*3/uL (ref 1.7–7.7)
RBC: 5.07 MIL/uL (ref 4.22–5.81)
RDW: 12.6 % (ref 11.5–15.5)
WBC: 6 10*3/uL (ref 4.0–10.5)

## 2012-12-26 LAB — COMPREHENSIVE METABOLIC PANEL
ALT: 14 U/L (ref 0–53)
Albumin: 4 g/dL (ref 3.5–5.2)
Alkaline Phosphatase: 95 U/L (ref 39–117)
Chloride: 99 mEq/L (ref 96–112)
Glucose, Bld: 96 mg/dL (ref 70–99)
Potassium: 3.4 mEq/L — ABNORMAL LOW (ref 3.5–5.1)
Sodium: 138 mEq/L (ref 135–145)
Total Protein: 7.9 g/dL (ref 6.0–8.3)

## 2012-12-26 LAB — CEA: CEA: 2 ng/mL (ref 0.0–5.0)

## 2012-12-26 MED ORDER — POTASSIUM CHLORIDE CRYS ER 20 MEQ PO TBCR: 20.0000 meq | EXTENDED_RELEASE_TABLET | Freq: Every day | ORAL | Status: DC

## 2012-12-26 NOTE — Progress Notes (Signed)
Labs drawn today for cbc/diff,cea,ferr,cmp

## 2013-02-02 IMAGING — CT CT ABD-PELV W/ CM
2 of 4 series · 13 of 36 positions shown, 16 images · IV contrast (Omnipaque 300)
Comparison: CT 09/24/2010

CT CHEST

CLINICAL DATA: Stage III colorectal carcinoma.  This is status
post resection.

CT CHEST, ABDOMEN AND PELVIS WITH CONTRAST
TECHNIQUE: Multidetector CT imaging of the chest, abdomen and
pelvis was performed following the standard protocol during bolus
administration of intravenous contrast.
Contrast: 100mL OMNIPAQUE IOHEXOL 300 MG/ML  SOLN

[Series 2: cap with 5.0 b40f · axial · 0.67mm/px · z∈[-556,-50]mm · 10 of 125 slices shown, 13 images]
[im 12/125  mediastinal]
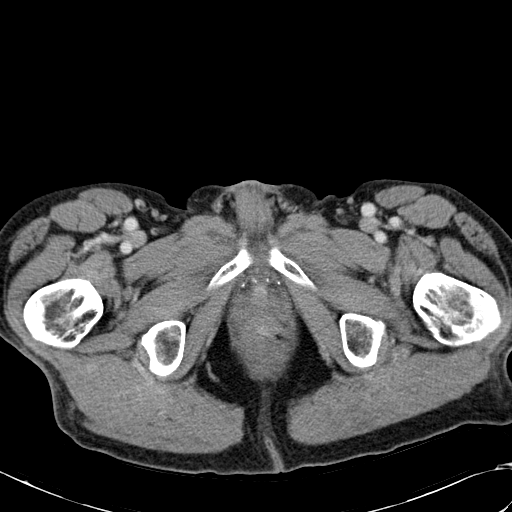
[im 12/125  lung]
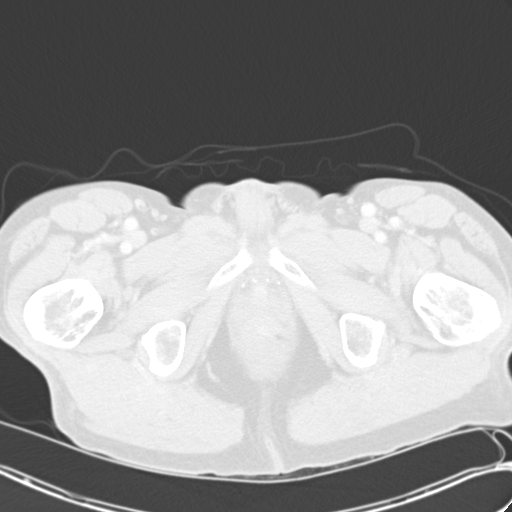
[im 23/125  lung]
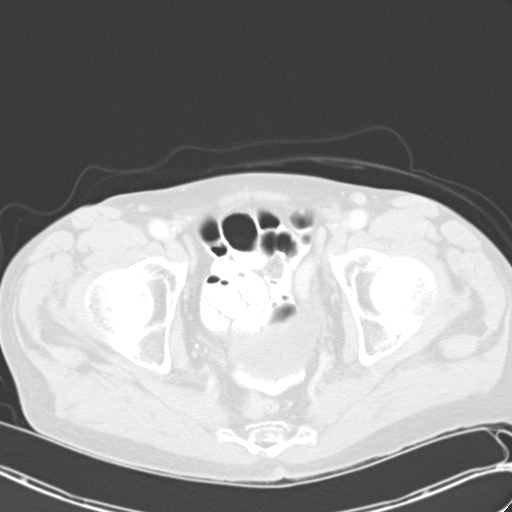
[im 34/125  lung]
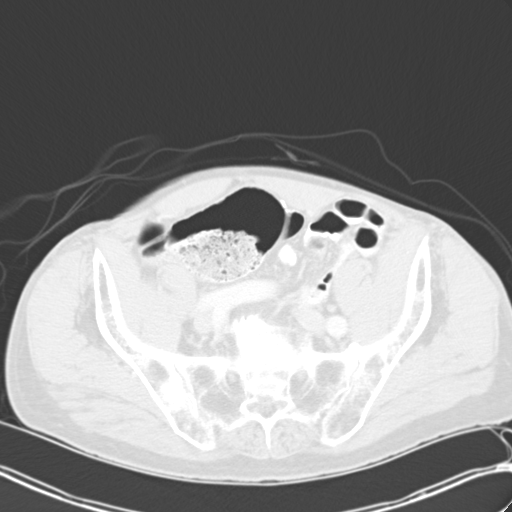
[im 46/125  lung]
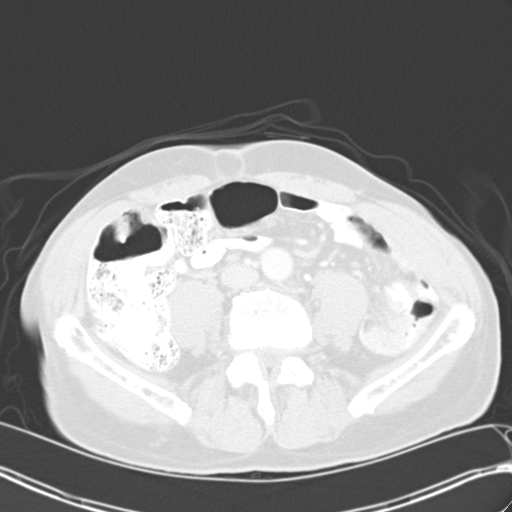
[im 57/125  mediastinal]
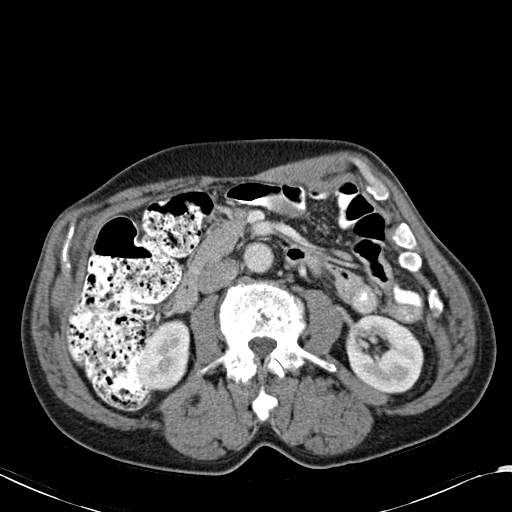
[im 57/125  lung]
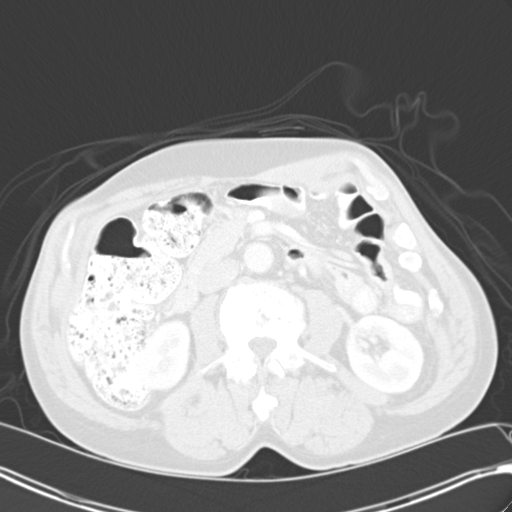
[im 68/125  lung]
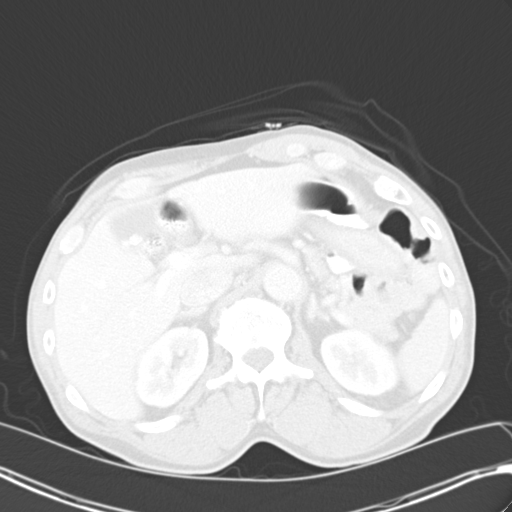
[im 79/125  lung]
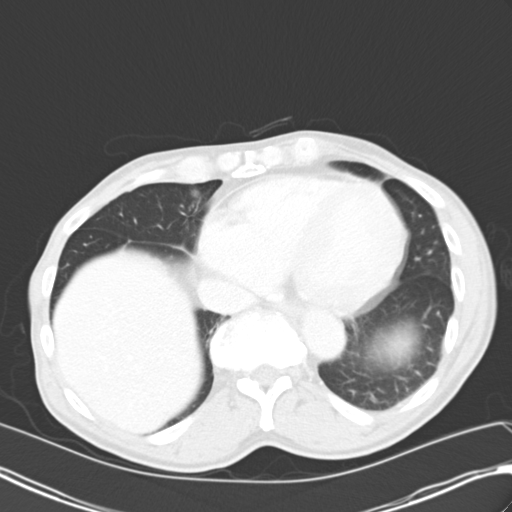
[im 91/125  lung]
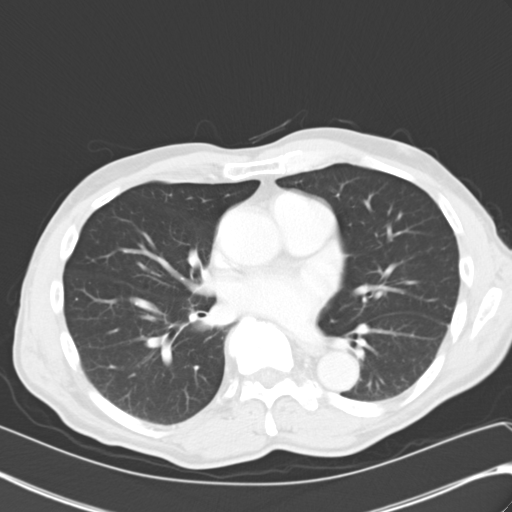
[im 102/125  mediastinal]
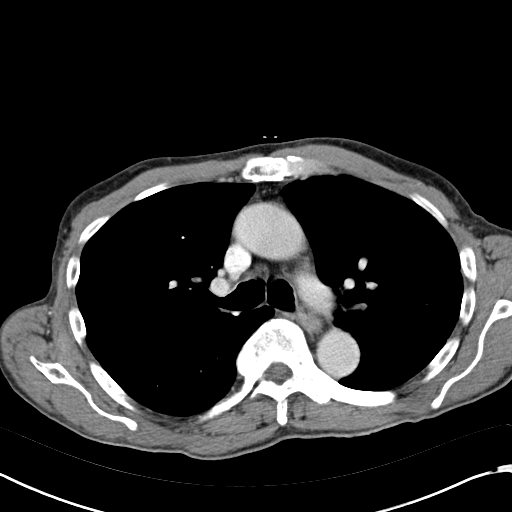
[im 102/125  lung]
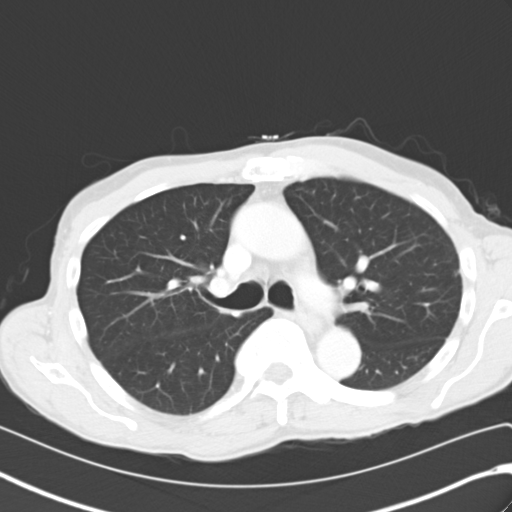
[im 113/125  lung]
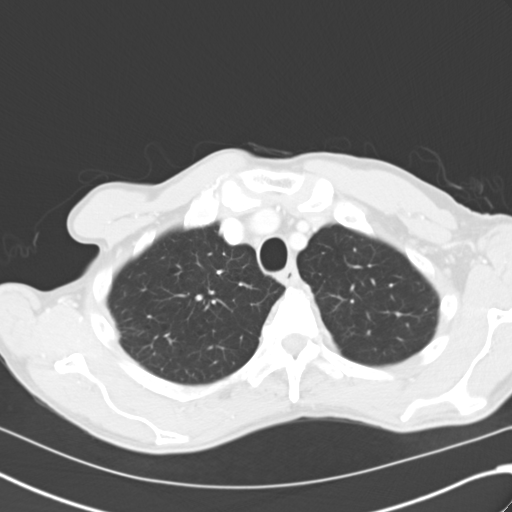

[Series 4: mpr cor post contrast (id) · coronal · 0.66mm/px · 3 of 79 slices shown]
[im 16/79  lung]
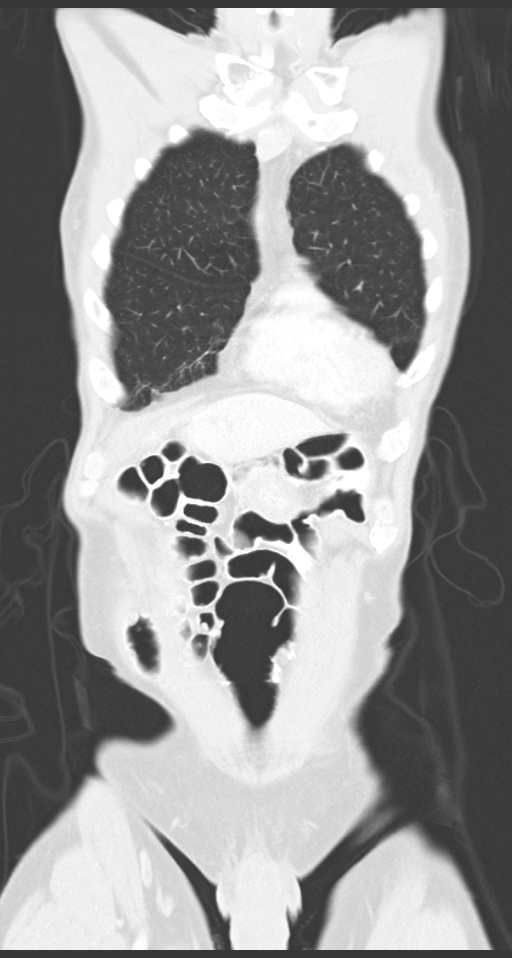
[im 32/79  lung]
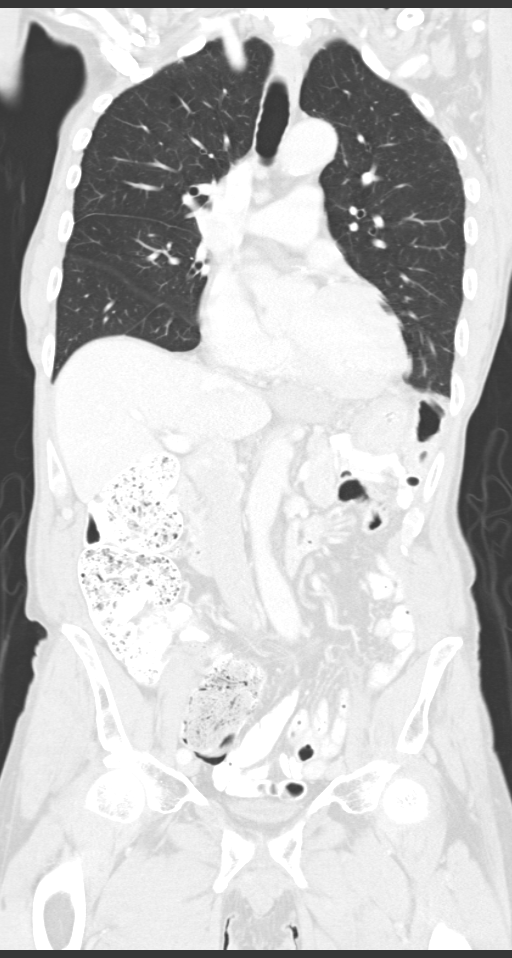
[im 47/79  lung]
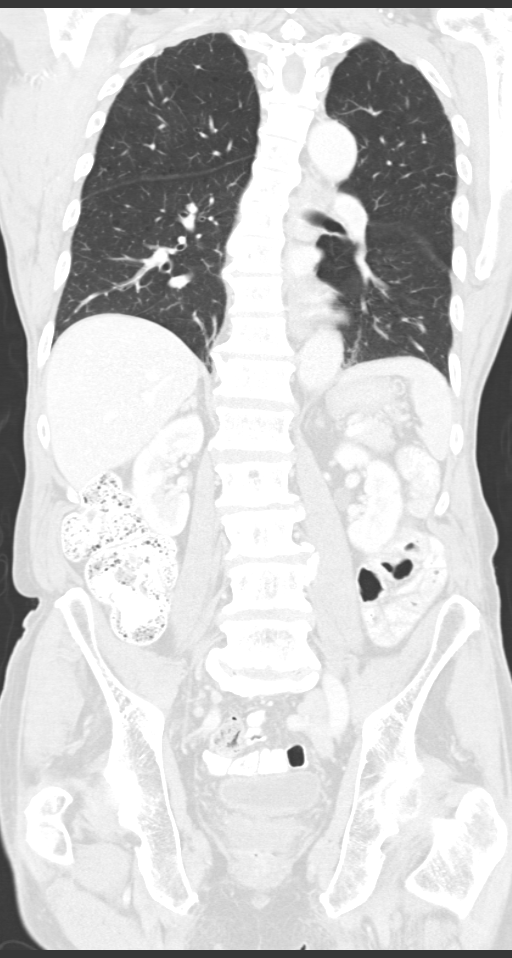

[13 of 36 positions shown; findings below may reference images not displayed]

FINDINGS: No axillary or supraclavicular lymphadenopathy.  There
is mild mediastinal lymphadenopathy which is unchanged..  For
example 15 mm precarinal lymph node compares to 15 mm on prior.  AP
window node measures 9 mm compared to a 10 mm on prior.  No new
adenopathy.

Review of the bone windows demonstrates no aggressive osseous
lesions.
IMPRESSION: 1..  Stable mildly enlarged mediastinal lymph nodes.
2.  No evidence of pulmonary metastasis.

CT ABDOMEN AND PELVIS
FINDINGS: No focal hepatic lesion.  There are gallstones in the
gallbladder.  Mild prominence of the biliary ducts is similar to
prior.  The pancreas, spleen, adrenal glands, kidneys are
unchanged.  There is a small density posterior the spleen measuring
12 mm (image 52) is unchanged from prior.  This may represent
splenule.

The stomach, small bowel, and colon unremarkable.  Post left
hemicolectomy with no evidence obstruction or mass.

Abdominal aorta normal caliber.  No retroperitoneal.  Or
adenopathy.  No peritoneal disease.

No free fluid the pelvis.  The bladder is normal.  Prostate gland
is normal.  No pelvic lymphadenopathy. Review of  bone windows
demonstrates no aggressive osseous lesions.  Sclerotic lesion in
the right iliac bone is unchanged.  There are several lucent
lesions within the iliac bones which are unchanged.  Lucent lesion
at L3 is also unchanged.
IMPRESSION: 1..  No evidence of colon cancer recurrence or metastasis in the
abdomen or pelvis.
2.  Stable left hemicolectomy.

## 2013-03-27 ENCOUNTER — Other Ambulatory Visit (HOSPITAL_COMMUNITY): Payer: Medicare Other

## 2013-03-29 ENCOUNTER — Encounter (HOSPITAL_COMMUNITY): Payer: Medicare Other | Attending: Oncology | Admitting: Oncology

## 2013-03-29 ENCOUNTER — Telehealth (HOSPITAL_COMMUNITY): Payer: Self-pay | Admitting: *Deleted

## 2013-03-29 ENCOUNTER — Encounter (HOSPITAL_COMMUNITY): Payer: Self-pay | Admitting: Oncology

## 2013-03-29 ENCOUNTER — Other Ambulatory Visit (HOSPITAL_COMMUNITY): Payer: Medicare Other

## 2013-03-29 ENCOUNTER — Other Ambulatory Visit (HOSPITAL_COMMUNITY): Payer: Self-pay | Admitting: Oncology

## 2013-03-29 VITALS — BP 175/106 | HR 90 | Temp 98.2°F | Resp 16 | Wt 155.0 lb

## 2013-03-29 DIAGNOSIS — G5790 Unspecified mononeuropathy of unspecified lower limb: Secondary | ICD-10-CM | POA: Insufficient documentation

## 2013-03-29 DIAGNOSIS — D509 Iron deficiency anemia, unspecified: Secondary | ICD-10-CM

## 2013-03-29 DIAGNOSIS — C189 Malignant neoplasm of colon, unspecified: Secondary | ICD-10-CM

## 2013-03-29 DIAGNOSIS — Z09 Encounter for follow-up examination after completed treatment for conditions other than malignant neoplasm: Secondary | ICD-10-CM | POA: Insufficient documentation

## 2013-03-29 DIAGNOSIS — E876 Hypokalemia: Secondary | ICD-10-CM

## 2013-03-29 DIAGNOSIS — I10 Essential (primary) hypertension: Secondary | ICD-10-CM | POA: Insufficient documentation

## 2013-03-29 DIAGNOSIS — Z85038 Personal history of other malignant neoplasm of large intestine: Secondary | ICD-10-CM | POA: Insufficient documentation

## 2013-03-29 DIAGNOSIS — C185 Malignant neoplasm of splenic flexure: Secondary | ICD-10-CM

## 2013-03-29 DIAGNOSIS — C779 Secondary and unspecified malignant neoplasm of lymph node, unspecified: Secondary | ICD-10-CM

## 2013-03-29 DIAGNOSIS — G609 Hereditary and idiopathic neuropathy, unspecified: Secondary | ICD-10-CM

## 2013-03-29 LAB — CBC WITH DIFFERENTIAL/PLATELET
Basophils Relative: 0 % (ref 0–1)
Eosinophils Absolute: 0.1 10*3/uL (ref 0.0–0.7)
Eosinophils Relative: 1 % (ref 0–5)
HCT: 43.9 % (ref 39.0–52.0)
Hemoglobin: 15.2 g/dL (ref 13.0–17.0)
MCH: 29 pg (ref 26.0–34.0)
MCHC: 34.6 g/dL (ref 30.0–36.0)
Monocytes Absolute: 0.3 10*3/uL (ref 0.1–1.0)
Monocytes Relative: 5 % (ref 3–12)

## 2013-03-29 LAB — BASIC METABOLIC PANEL
BUN: 9 mg/dL (ref 6–23)
Calcium: 9.6 mg/dL (ref 8.4–10.5)
GFR calc non Af Amer: 82 mL/min — ABNORMAL LOW (ref >90)
Glucose, Bld: 99 mg/dL (ref 70–99)

## 2013-03-29 MED ORDER — POTASSIUM CHLORIDE CRYS ER 20 MEQ PO TBCR: EXTENDED_RELEASE_TABLET | ORAL | Status: DC

## 2013-03-29 NOTE — Telephone Encounter (Signed)
.  CRITICAL VALUE ALERT Critical value received:  Potassium 2.7 Date of notification:  03/29/2013 Time of notification: 1235 Critical value read back:  yes Nurse who received alert:  Tar MD notified (1st page):  kefalas

## 2013-03-29 NOTE — Progress Notes (Signed)
[name], MD [address] [place] [address]  Adenocarcinoma of colon  Iron deficiency anemia  CURRENT THERAPY: Surveillance per NCCN guidelines.  Continue CEA every 3 months due to high risk of recurrence due to lack of adjuvant therapy and annual CT surveillance.  INTERVAL HISTORY: Francisco Johnston 79 y.o. 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 Status post surgery on 07/09/2010 with 1 positive lymph node. Patient was too weak and too disabled to undergo adjuvant chemotherapy.  I personally reviewed and went over laboratory results with the patient.  His white blood cell count, hemoglobin, and platelet count are within normal limits. His ferritin is 248. He was noted to be fairly hypokalemic at 3.4. CEA is very stable at 2.0. This laboratory work is from 12/26/2012.  I personally reviewed and went over radiographic studies with the patient.  I spent some time reviewing his CT scan from November of 2013. CT of chest showed stable, mildly enlarged mediastinal lymph nodes and no evidence of pulmonary metastasis. CT of abdomen and pelvis demonstrates no evidence of colon cancer recurrence or metastasis and stable left hemicolectomy.  He reports that he is doing well. His appetite is strong. He has lost a proctoscopy 10 pounds over 2 year time span. This is not significant.  His vital signs reviewed today and he is noted to be hypertensive with a systolic pressure of 175 and a diastolic pressure of 106. He is on amlodipine and triamterene/hydrochlorothiazide. This is managed by his primary care physician Dr. Gerda Diss. He was strongly urged to call Dr. Fletcher Anon office today to set up a followup appointment regarding his hypertension. He likely will need some changes in his blood pressure medication and I'll defer this to his primary care physician.  Oncologically, the patient denies any complaints and  ROS questioning is negative. We'll continue to follow and NCCN guidelines regarding surveillance. We will continue to perform CEAs every 3 months particularly in light of his inability to undergo adjuvant chemotherapy due to performance status and as result he is at increased risk for recurrence. Her NCCN guidelines, we'll continue annual CT scans surveillance. We'll perform CT of chest to followup on mildly enlarged mediastinal lymph nodes that are stable to evaluate for stability. Of course, we'll perform a CT of abdomen and pelvis with contrast for surveillance purposes of stage III adenocarcinoma of colon.    Past Medical History  Diagnosis Date  . Heart disease   . Hypertension   . Cancer     colon  . Adenocarcinoma of colon 06/12/2011  . Iron deficiency anemia 06/12/2011    has Adenocarcinoma of colon and Iron deficiency anemia on his problem list.     has No Known Allergies.  Mr. Orvis does not currently have medications on file.  Past Surgical History  Procedure Laterality Date  . Colectomy    . Colonoscopy      Denies any headaches, dizziness, double vision, fevers, chills, night sweats, nausea, vomiting, diarrhea, constipation, chest pain, heart palpitations, shortness of breath, blood in stool, black tarry stool, urinary pain, urinary burning, urinary frequency, hematuria.   PHYSICAL EXAMINATION  ECOG PERFORMANCE STATUS: 2 - Symptomatic, <50% confined to bed  Filed Vitals:   03/29/13 1000  BP: 175/106  Pulse: 90  Temp: 98.2 F (36.8 C)  Resp: 16   GENERAL:alert, no distress, well developed, cachectic, comfortable, cooperative, smiling and edentulous  SKIN:  skin color, texture, turgor are normal, no rashes or significant lesions  HEAD: Normocephalic, No masses, lesions, tenderness or abnormalities  EYES: normal, Conjunctiva are pink and non-injected  EARS: External ears normal  OROPHARYNX:lips, buccal mucosa, and tongue normal, edentulous, mucous membranes are  moist and  Halitosis appreciated with brown staining of tongue. NECK: supple, no adenopathy, thyroid normal size, non-tender, without nodularity, no stridor, non-tender, trachea midline  LYMPH: no palpable lymphadenopathy, no hepatosplenomegaly, liver edge palpated on deep inspiration, but not enlarged.  BREAST:not examined  LUNGS: clear to auscultation and percussion  HEART: regular rate & rhythm, no murmurs, no gallops, S1 normal and S2 normal  ABDOMEN:abdomen soft, non-tender, normal bowel sounds, no masses or organomegaly and no hepatosplenomegaly  BACK: Back symmetric, no curvature., No CVA tenderness  EXTREMITIES:less then 2 second capillary refill, no joint deformities, effusion, or inflammation, no edema, no skin discoloration, no clubbing, no cyanosis  NEURO: alert & oriented x 3 with fluent speech, no focal motor/sensory deficits, gait supported with rolling walker due to instability.     LABORATORY DATA: CBC    Component Value Date/Time   WBC 6.0 12/26/2012 0936   RBC 5.07 12/26/2012 0936   HGB 14.6 12/26/2012 0936   HCT 43.7 12/26/2012 0936   PLT 183 12/26/2012 0936   MCV 86.2 12/26/2012 0936   MCH 28.8 12/26/2012 0936   MCHC 33.4 12/26/2012 0936   RDW 12.6 12/26/2012 0936   LYMPHSABS 1.9 12/26/2012 0936   MONOABS 0.3 12/26/2012 0936   EOSABS 0.1 12/26/2012 0936   BASOSABS 0.0 12/26/2012 0936    Lab Results  Component Value Date   CEA 2.0 12/26/2012     Chemistry      Component Value Date/Time   NA 138 12/26/2012 0936   K 3.4* 12/26/2012 0936   CL 99 12/26/2012 0936   CO2 30 12/26/2012 0936   BUN 9 12/26/2012 0936   CREATININE 0.71 12/26/2012 0936      Component Value Date/Time   CALCIUM 9.3 12/26/2012 0936   ALKPHOS 95 12/26/2012 0936   AST 20 12/26/2012 0936   ALT 14 12/26/2012 0936   BILITOT 0.4 12/26/2012 0936     Lab Results  Component Value Date   FERRITIN 248 12/26/2012      ASSESSMENT:  1. Stage III adenocarcinoma of the splenic flexure with 1 positive lymph  node. Status post surgery on 07/09/2010 with 1 positive lymph node. Patient was too weak and too disabled to undergo adjuvant chemotherapy. Following NCCN guidelines for surveillance 9CEA every 3 months and CT scan annually). 2. Severe iron deficient anemia at presentation with a hemoglobin of 4 and a ferritin of 2. Resolved. 3. Severe weakness and fatigue  4. Peripheral neuropathy of the right leg which writing of the right foot  5. History of myocardial infarctions in 1970 and 1982  6. Hypertension, uncontrolled due to noncompliance.  7. Hypokalemia, we will evaluate today.  8. History of prostate cancer status post radiation therapy in 2003 by Dr. Chipper Herb.  9. HTN today in clinic, 175/106   PLAN:  1. I personally reviewed and went over laboratory results with the patient. 2. I personally reviewed and went over radiographic studies with the patient. 3. Labs today: CBC diff, BMET, CEA, Ferritin 4. Labs in 3 months: CBC diff, CEA 5. Labs in 6 months: CBC diff, CMET, Ferritin, CEA. 6. CT CAP with contrast in November 2014 for surveillance of Stage III Colon Cancer and Mildly enlarged mediastinal lymph nodes 7.  NCCN guideline:  A. Annual CT surveilance  B. Continue CEA every 3 months in light of patient's inability to undergo adjuvant chemotherapy and is at increased risk for recurrence.  8. Patient encouraged to contact PCP today to set-up a follow-up appointment for HTN management.  9. Return in 6 months for follow-up.  All questions were answered. The patient knows to call the clinic with any problems, questions or concerns. We can certainly see the patient much sooner if necessary.  The patient and plan discussed with Glenford Peers, MD and he is in agreement with the aforementioned.  KEFALAS,THOMAS

## 2013-03-29 NOTE — Telephone Encounter (Signed)
I called in Kdur 20 mEq, take TID x 14 days, then BID thereafter.

## 2013-03-29 NOTE — Patient Instructions (Addendum)
[facility] Discharge Instructions  RECOMMENDATIONS MADE BY THE CONSULTANT AND ANY TEST RESULTS WILL BE SENT TO YOUR REFERRING PHYSICIAN.  Lab work today. We will call you with any abnormal results. Lab work again in 3 months and 6 months. Return to see physician after 6 month lab work. Please get in touch with Dr.Luking about elevated blood pressure. He may need to adjust your blood pressure medication.  Thank you for choosing [facility] to provide your oncology and hematology care.  To afford each patient quality time with our providers, please arrive at least 15 minutes before your scheduled appointment time.  With your help, our goal is to use those 15 minutes to complete the necessary work-up to ensure our physicians have the information they need to help with your evaluation and healthcare recommendations.    Effective January 1st, 2014, we ask that you re-schedule your appointment with our physicians should you arrive 10 or more minutes late for your appointment.  We strive to give you quality time with our providers, and arriving late affects you and other patients whose appointments are after yours.    Again, thank you for choosing [facility].  Our hope is that these requests will decrease the amount of time that you wait before being seen by our physicians.       _____________________________________________________________  Should you have questions after your visit to [facility], please contact our office at [phone] between the hours of 8:30 a.m. and 5:00 p.m.  Voicemails left after 4:30 p.m. will not be returned until the following business day.  For prescription refill requests, have your pharmacy contact our office with your prescription refill request.

## 2013-03-31 ENCOUNTER — Ambulatory Visit (HOSPITAL_COMMUNITY): Payer: Medicare Other | Admitting: Oncology

## 2013-04-03 ENCOUNTER — Encounter: Payer: Self-pay | Admitting: *Deleted

## 2013-04-05 ENCOUNTER — Ambulatory Visit (INDEPENDENT_AMBULATORY_CARE_PROVIDER_SITE_OTHER): Payer: Medicare Other | Admitting: Family Medicine

## 2013-04-05 ENCOUNTER — Encounter: Payer: Self-pay | Admitting: Family Medicine

## 2013-04-05 VITALS — BP 139/80 | Ht 71.0 in | Wt 159.0 lb

## 2013-04-05 DIAGNOSIS — I10 Essential (primary) hypertension: Secondary | ICD-10-CM

## 2013-04-05 DIAGNOSIS — I259 Chronic ischemic heart disease, unspecified: Secondary | ICD-10-CM | POA: Insufficient documentation

## 2013-04-05 DIAGNOSIS — Z8546 Personal history of malignant neoplasm of prostate: Secondary | ICD-10-CM

## 2013-04-05 DIAGNOSIS — I639 Cerebral infarction, unspecified: Secondary | ICD-10-CM | POA: Insufficient documentation

## 2013-04-05 DIAGNOSIS — E119 Type 2 diabetes mellitus without complications: Secondary | ICD-10-CM

## 2013-04-05 DIAGNOSIS — I6350 Cerebral infarction due to unspecified occlusion or stenosis of unspecified cerebral artery: Secondary | ICD-10-CM

## 2013-04-05 MED ORDER — AMLODIPINE BESYLATE 10 MG PO TABS: 10.0000 mg | ORAL_TABLET | Freq: Every day | ORAL | Status: DC

## 2013-04-05 NOTE — Progress Notes (Signed)
  Subjective:    Patient ID: Francisco Johnston, male    DOB: 20-Oct-1934, 79 y.o.   MRN: [id number]  HPI Patient arrives office with multiple concerns. He has a history of her prior stroke. No new stroke or TIA symptoms  History of coronary artery disease. Reports no difficulty with chest pain. No excessive shortness of breath.  History of hypertension. Claims compliance with meds. Not exercising. Recently blood pressure numbers were elevated considerably at Dr. Lesle Chris office.  Patient has history of glucose intolerance. Trying to watch his sugar in his diet.   Review of Systems    review systems otherwise negative. Results for orders placed in visit on 04/05/13  POCT GLYCOSYLATED HEMOGLOBIN (HGB A1C)      Result Value Range   Hemoglobin A1C 5.5      Objective:   Physical Exam Alert no acute distress. Baseline neuro deficit still present. Vitals reviewed. Lungs clear. Heart regular in rhythm. Ankles without edema. Blood pressure still high on repeat 142/94       Assessment & Plan:  Impression #1 hypertension suboptimal in discussed. #2 impaired fasting glucose clinically stable. #3 hyperlipidemia status uncertain. #4 hypertension suboptimal in discussed. Plan patient declines blood work. Increase Norvasc to 10 mg daily. Check in 6 months. Diet exercise discussed. WSL

## 2013-04-12 DIAGNOSIS — Z8546 Personal history of malignant neoplasm of prostate: Secondary | ICD-10-CM | POA: Insufficient documentation

## 2013-06-28 ENCOUNTER — Other Ambulatory Visit (HOSPITAL_COMMUNITY): Payer: Medicare Other

## 2013-06-29 ENCOUNTER — Encounter (HOSPITAL_COMMUNITY): Payer: Medicare Other | Attending: Hematology and Oncology

## 2013-06-29 DIAGNOSIS — D509 Iron deficiency anemia, unspecified: Secondary | ICD-10-CM | POA: Insufficient documentation

## 2013-06-29 DIAGNOSIS — C189 Malignant neoplasm of colon, unspecified: Secondary | ICD-10-CM

## 2013-06-29 LAB — CBC WITH DIFFERENTIAL/PLATELET
Eosinophils Absolute: 0 10*3/uL (ref 0.0–0.7)
Eosinophils Relative: 0 % (ref 0–5)
Lymphs Abs: 1.2 10*3/uL (ref 0.7–4.0)
MCH: 29.5 pg (ref 26.0–34.0)
MCV: 85.9 fL (ref 78.0–100.0)
Platelets: 155 10*3/uL (ref 150–400)
RBC: 5.05 MIL/uL (ref 4.22–5.81)

## 2013-06-29 NOTE — Progress Notes (Signed)
Labs drawn today for cbc/diff,cea

## 2013-06-30 LAB — CEA: CEA: 1.7 ng/mL (ref 0.0–5.0)

## 2013-08-06 ENCOUNTER — Inpatient Hospital Stay (HOSPITAL_COMMUNITY)
Admission: EM | Admit: 2013-08-06 | Discharge: 2013-08-15 | DRG: 065 | Disposition: A | Discharge status: Skilled Nursing Facility | Payer: Medicare Other | Attending: Neurology | Admitting: Neurology

## 2013-08-06 ENCOUNTER — Encounter (HOSPITAL_COMMUNITY): Payer: Self-pay | Admitting: Emergency Medicine

## 2013-08-06 DIAGNOSIS — I6381 Other cerebral infarction due to occlusion or stenosis of small artery: Secondary | ICD-10-CM | POA: Diagnosis present

## 2013-08-06 DIAGNOSIS — I259 Chronic ischemic heart disease, unspecified: Secondary | ICD-10-CM | POA: Diagnosis present

## 2013-08-06 DIAGNOSIS — I252 Old myocardial infarction: Secondary | ICD-10-CM

## 2013-08-06 DIAGNOSIS — I4891 Unspecified atrial fibrillation: Secondary | ICD-10-CM | POA: Diagnosis present

## 2013-08-06 DIAGNOSIS — I639 Cerebral infarction, unspecified: Secondary | ICD-10-CM

## 2013-08-06 DIAGNOSIS — I619 Nontraumatic intracerebral hemorrhage, unspecified: Principal | ICD-10-CM | POA: Diagnosis present

## 2013-08-06 DIAGNOSIS — I69122 Dysarthria following nontraumatic intracerebral hemorrhage: Secondary | ICD-10-CM

## 2013-08-06 DIAGNOSIS — E119 Type 2 diabetes mellitus without complications: Secondary | ICD-10-CM | POA: Diagnosis present

## 2013-08-06 DIAGNOSIS — E876 Hypokalemia: Secondary | ICD-10-CM | POA: Diagnosis present

## 2013-08-06 DIAGNOSIS — R569 Unspecified convulsions: Secondary | ICD-10-CM

## 2013-08-06 DIAGNOSIS — Z79899 Other long term (current) drug therapy: Secondary | ICD-10-CM

## 2013-08-06 DIAGNOSIS — I69351 Hemiplegia and hemiparesis following cerebral infarction affecting right dominant side: Secondary | ICD-10-CM

## 2013-08-06 DIAGNOSIS — S31000A Unspecified open wound of lower back and pelvis without penetration into retroperitoneum, initial encounter: Secondary | ICD-10-CM

## 2013-08-06 DIAGNOSIS — N390 Urinary tract infection, site not specified: Secondary | ICD-10-CM | POA: Diagnosis present

## 2013-08-06 DIAGNOSIS — R29810 Facial weakness: Secondary | ICD-10-CM | POA: Diagnosis present

## 2013-08-06 DIAGNOSIS — I2510 Atherosclerotic heart disease of native coronary artery without angina pectoris: Secondary | ICD-10-CM | POA: Diagnosis present

## 2013-08-06 DIAGNOSIS — I10 Essential (primary) hypertension: Secondary | ICD-10-CM | POA: Diagnosis present

## 2013-08-06 DIAGNOSIS — I69959 Hemiplegia and hemiparesis following unspecified cerebrovascular disease affecting unspecified side: Secondary | ICD-10-CM

## 2013-08-06 DIAGNOSIS — R471 Dysarthria and anarthria: Secondary | ICD-10-CM | POA: Diagnosis present

## 2013-08-06 HISTORY — DX: Cerebral infarction, unspecified: I63.9

## 2013-08-06 NOTE — ED Provider Notes (Signed)
Scribed for Dione Booze, MD, the patient was seen in room APA18/APA18. This chart was scribed by Lewanda Rife, ED scribe. Patient's care was started at 2354  CSN: [id number]     Arrival date & time 08/06/13  2342 History   First MD Initiated Contact with Patient 08/06/13 2353     Chief Complaint  Patient presents with  . Aphasia   (Consider location/radiation/quality/duration/timing/severity/associated sxs/prior Treatment) The history is provided by the patient and a relative.   HPI Comments: Francisco Johnston is a 79 y.o. male who presents to the Emergency Department complaining of acute expressive aphasia onset 30 minutes PTA while watching TV. Relatives report associated slurred speech and right sided weakness. Family reports he was asymptomatic 30 min ago. Denies associated nausea, chest pain, and headaches. Denies recent surgery. Reports PMHx of prostate cancer, HTN, and CAD.   Past Medical History  Diagnosis Date  . Heart disease   . Hypertension   . Cancer     colon  . Adenocarcinoma of colon 06/12/2011  . Iron deficiency anemia 06/12/2011  . CAD (coronary artery disease)   . Noncompliance   . ED (erectile dysfunction)   . IFG (impaired fasting glucose)   . Lacunar stroke    Past Surgical History  Procedure Laterality Date  . Colectomy    . Colonoscopy     Family History  Problem Relation Age of Onset  . Cancer Father   . Cancer Sister    History  Substance Use Topics  . Smoking status: Never Smoker   . Smokeless tobacco: Current User    Types: Snuff  . Alcohol Use: Yes     Comment: occ    Review of Systems  Respiratory: Negative for shortness of breath.   Cardiovascular: Negative for chest pain.  Neurological: Positive for weakness. Negative for headaches.   A complete 10 system review of systems was obtained and all systems are negative except as noted in the HPI and PMH.    Allergies  Review of patient's allergies indicates no known allergies.  Home  Medications   Current Outpatient Rx  Name  Route  Sig  Dispense  Refill  . amLODipine (NORVASC) 10 MG tablet   Oral   Take 1 tablet (10 mg total) by mouth daily.   30 tablet   6   . aspirin 81 MG tablet   Oral   Take 81 mg by mouth daily.           . iron polysaccharides (NIFEREX) 150 MG capsule   Oral   Take 1 capsule (150 mg total) by mouth 2 (two) times daily. out   60 capsule   2   . potassium chloride SA (K-DUR,KLOR-CON) 20 MEQ tablet      Take 1 tablet PO TID x 14 days then BID PO thereafter   90 tablet   2   . triamterene-hydrochlorothiazide (MAXZIDE-25) 37.5-25 MG per tablet   Oral   Take 1 tablet by mouth daily.            BP 186/139  Pulse 97  Temp(Src) 98.5 F (36.9 C) (Oral)  Resp 18  SpO2 94% Physical Exam  Nursing note and vitals reviewed. Constitutional: He is oriented to person, place, and time. He appears well-developed and well-nourished. No distress.  HENT:  Head: Normocephalic and atraumatic.  Eyes: EOM are normal.  Fundi were poorly visualized  Neck: Neck supple. No tracheal deviation present.  Cardiovascular: Normal rate.   Pulmonary/Chest: Effort normal.  No respiratory distress.  Musculoskeletal: Normal range of motion.  Neurological: He is alert and oriented to person, place, and time.  Dysarthric speech. Mild right facial droop. Dense right hemiparesis arm 1/5 and leg 3/5. Right toe up going    Skin: Skin is warm and dry.  Psychiatric: He has a normal mood and affect. His behavior is normal.    ED Course  Procedures (including critical care time) Medications - No data to display  Results for orders placed during the hospital encounter of 08/06/13  GLUCOSE, CAPILLARY      Result Value Range   Glucose-Capillary 113 (*) 70 - 99 mg/dL  ETHANOL      Result Value Range   Alcohol, Ethyl (B) <11  0 - 11 mg/dL  PROTIME-INR      Result Value Range   Prothrombin Time 13.5  11.6 - 15.2 seconds   INR 1.05  0.00 - 1.49  APTT       Result Value Range   aPTT 24  24 - 37 seconds  CBC      Result Value Range   WBC 5.3  4.0 - 10.5 K/uL   RBC 5.25  4.22 - 5.81 MIL/uL   Hemoglobin 15.4  13.0 - 17.0 g/dL   HCT 16.1  09.6 - 04.5 %   MCV 86.7  78.0 - 100.0 fL   MCH 29.3  26.0 - 34.0 pg   MCHC 33.8  30.0 - 36.0 g/dL   RDW 40.9  81.1 - 91.4 %   Platelets 165  150 - 400 K/uL  DIFFERENTIAL      Result Value Range   Neutrophils Relative % 50  43 - 77 %   Neutro Abs 2.7  1.7 - 7.7 K/uL   Lymphocytes Relative 39  12 - 46 %   Lymphs Abs 2.1  0.7 - 4.0 K/uL   Monocytes Relative 7  3 - 12 %   Monocytes Absolute 0.4  0.1 - 1.0 K/uL   Eosinophils Relative 3  0 - 5 %   Eosinophils Absolute 0.2  0.0 - 0.7 K/uL   Basophils Relative 0  0 - 1 %   Basophils Absolute 0.0  0.0 - 0.1 K/uL  COMPREHENSIVE METABOLIC PANEL      Result Value Range   Sodium 137  135 - 145 mEq/L   Potassium 3.0 (*) 3.5 - 5.1 mEq/L   Chloride 98  96 - 112 mEq/L   CO2 29  19 - 32 mEq/L   Glucose, Bld 116 (*) 70 - 99 mg/dL   BUN 5 (*) 6 - 23 mg/dL   Creatinine, Ser 7.82  0.50 - 1.35 mg/dL   Calcium 9.6  8.4 - 95.6 mg/dL   Total Protein 8.5 (*) 6.0 - 8.3 g/dL   Albumin 4.1  3.5 - 5.2 g/dL   AST 17  0 - 37 U/L   ALT 8  0 - 53 U/L   Alkaline Phosphatase 99  39 - 117 U/L   Total Bilirubin 0.6  0.3 - 1.2 mg/dL   GFR calc non Af Amer 85 (*) >90 mL/min   GFR calc Af Amer >90  >90 mL/min  URINE RAPID DRUG SCREEN (HOSP PERFORMED)      Result Value Range   Opiates NONE DETECTED  NONE DETECTED   Cocaine NONE DETECTED  NONE DETECTED   Benzodiazepines NONE DETECTED  NONE DETECTED   Amphetamines NONE DETECTED  NONE DETECTED   Tetrahydrocannabinol NONE DETECTED  NONE DETECTED  Barbiturates NONE DETECTED  NONE DETECTED  URINALYSIS, ROUTINE W REFLEX MICROSCOPIC      Result Value Range   Color, Urine YELLOW  YELLOW   APPearance CLEAR  CLEAR   Specific Gravity, Urine 1.015  1.005 - 1.030   pH 7.0  5.0 - 8.0   Glucose, UA NEGATIVE  NEGATIVE mg/dL   Hgb urine  dipstick TRACE (*) NEGATIVE   Bilirubin Urine NEGATIVE  NEGATIVE   Ketones, ur NEGATIVE  NEGATIVE mg/dL   Protein, ur 086 (*) NEGATIVE mg/dL   Urobilinogen, UA 0.2  0.0 - 1.0 mg/dL   Nitrite NEGATIVE  NEGATIVE   Leukocytes, UA NEGATIVE  NEGATIVE  TROPONIN I      Result Value Range   Troponin I <0.30  <0.30 ng/mL  URINE MICROSCOPIC-ADD ON      Result Value Range   Squamous Epithelial / LPF RARE  RARE   WBC, UA 0-2  <3 WBC/hpf   RBC / HPF 0-2  <3 RBC/hpf   Bacteria, UA RARE  RARE   Ct Head Wo Contrast  08/07/2013   *RADIOLOGY REPORT*  Clinical Data: A aphasia.  Code stroke.  CT HEAD WITHOUT CONTRAST  Technique:  Contiguous axial images were obtained from the base of the skull through the vertex without contrast.  Comparison: 08/27/2008.  Findings:  Skull:No acute osseous abnormality.  Chronic flattening of the nasal bridge.  Orbits: No acute abnormality.  Brain:  3 cm hematoma within the subcortical posterior left frontal lobe.  No surrounding edema currently.  No subarachnoid or intraventricular extension.  Extensive chronic small vessel ischemic changes with bilateral deep grey lacunar infarctions and patchy bilateral cerebral white matter ischemic injuries, particularly around the frontal horns.  There is global brain atrophy, age advanced.  No hydrocephalus, shift, or herniation.  Critical Value/emergent results were called by telephone at the time of interpretation on 08/07/2013 at 0020 hours to Dr Preston Fleeting, who verbally acknowledged these results.  IMPRESSION: 1.Three cm posterior left frontal intraparenchymal hematoma.  2. Brain atrophy and extensive chronic small vessel ischemic injury.   Original Report Authenticated By: Tiburcio Pea   Images viewed by me, discussed with radiologist.   Date: 08/07/2013  Rate: 91  Rhythm: normal sinus rhythm and premature atrial contractions (PAC)  QRS Axis: normal  Intervals: normal  ST/T Wave abnormalities: normal  Conduction Disutrbances:none   Narrative Interpretation: Occasional premature atrial contraction, old anteroseptal myocardial infarction. When compared with ECG of 07/11/2010, no significant changes are seen.  Old EKG Reviewed: unchanged  CRITICAL CARE Performed by: VHQIO,NGEXB Total critical care time: 55 minutes Critical care time was exclusive of separately billable procedures and treating other patients. Critical care was necessary to treat or prevent imminent or life-threatening deterioration. Critical care was time spent personally by me on the following activities: development of treatment plan with patient and/or surrogate as well as nursing, discussions with consultants, evaluation of patient's response to treatment, examination of patient, obtaining history from patient or surrogate, ordering and performing treatments and interventions, ordering and review of laboratory studies, ordering and review of radiographic studies, pulse oximetry and re-evaluation of patient's condition.  MDM   1. Hemorrhagic stroke   2. Hypertension   3. Hypokalemia    Acute stroke which appears to be left hemisphere. Code stroke protocol was initiated and is sent for emergent CT of the head.  CT shows evidence of hemorrhagic stroke. He is given labetalol because of hypertension. He is noted to have worsening speech but his level of  consciousness remains good. Case has been discussed with Dr. Leroy Kennedy, of [facility], who agrees to accept the patient in transfer to [facility]. Patient and family are advised of the findings and need for transfer. He is noted to be hypokalemic and dose of intravenous potassium as ordered.    I personally performed the services described in this documentation, which was scribed in my presence. The recorded information has been reviewed and is accurate.     Dione Booze, MD 08/07/13 [phone]

## 2013-08-06 NOTE — ED Notes (Signed)
Son states they were at home watching TV and patient began slurring his speech and has weakness on right side.

## 2013-08-07 ENCOUNTER — Encounter (HOSPITAL_COMMUNITY): Payer: Self-pay

## 2013-08-07 ENCOUNTER — Inpatient Hospital Stay (HOSPITAL_COMMUNITY): Payer: Medicare Other

## 2013-08-07 ENCOUNTER — Emergency Department (HOSPITAL_COMMUNITY): Payer: Medicare Other

## 2013-08-07 LAB — COMPREHENSIVE METABOLIC PANEL
Albumin: 4.1 g/dL (ref 3.5–5.2)
Alkaline Phosphatase: 99 U/L (ref 39–117)
BUN: 5 mg/dL — ABNORMAL LOW (ref 6–23)
Calcium: 9.6 mg/dL (ref 8.4–10.5)
GFR calc Af Amer: >90 mL/min (ref >90)
Potassium: 3 mEq/L — ABNORMAL LOW (ref 3.5–5.1)
Sodium: 137 mEq/L (ref 135–145)
Total Protein: 8.5 g/dL — ABNORMAL HIGH (ref 6.0–8.3)

## 2013-08-07 LAB — CBC
MCH: 29.3 pg (ref 26.0–34.0)
MCHC: 33.8 g/dL (ref 30.0–36.0)
MCV: 86.7 fL (ref 78.0–100.0)
Platelets: 165 10*3/uL (ref 150–400)

## 2013-08-07 LAB — URINE MICROSCOPIC-ADD ON

## 2013-08-07 LAB — DIFFERENTIAL
Basophils Relative: 0 % (ref 0–1)
Eosinophils Absolute: 0.2 10*3/uL (ref 0.0–0.7)
Eosinophils Relative: 3 % (ref 0–5)
Monocytes Relative: 7 % (ref 3–12)
Neutrophils Relative %: 50 % (ref 43–77)

## 2013-08-07 LAB — BASIC METABOLIC PANEL
Chloride: 101 mEq/L (ref 96–112)
GFR calc Af Amer: >90 mL/min (ref >90)
GFR calc non Af Amer: >90 mL/min (ref >90)
Glucose, Bld: 133 mg/dL — ABNORMAL HIGH (ref 70–99)
Potassium: 3.3 mEq/L — ABNORMAL LOW (ref 3.5–5.1)
Sodium: 139 mEq/L (ref 135–145)

## 2013-08-07 LAB — URINALYSIS, ROUTINE W REFLEX MICROSCOPIC
Bilirubin Urine: NEGATIVE
Glucose, UA: NEGATIVE mg/dL
Ketones, ur: NEGATIVE mg/dL
Protein, ur: 100 mg/dL — AB
Urobilinogen, UA: 0.2 mg/dL (ref 0.0–1.0)

## 2013-08-07 LAB — RAPID URINE DRUG SCREEN, HOSP PERFORMED
Amphetamines: NOT DETECTED
Benzodiazepines: NOT DETECTED
Cocaine: NOT DETECTED
Opiates: NOT DETECTED
Tetrahydrocannabinol: NOT DETECTED

## 2013-08-07 LAB — PROTIME-INR
INR: 1.05 (ref 0.00–1.49)
Prothrombin Time: 13.5 seconds (ref 11.6–15.2)

## 2013-08-07 MED ORDER — NICARDIPINE HCL IN NACL 20-0.86 MG/200ML-% IV SOLN: 5.0000 mg/h | INTRAVENOUS | Status: DC

## 2013-08-07 MED ORDER — LABETALOL HCL 5 MG/ML IV SOLN
INTRAVENOUS | Status: AC
  Filled 2013-08-07: qty 4

## 2013-08-07 MED ORDER — LABETALOL HCL 5 MG/ML IV SOLN
INTRAVENOUS | Status: AC
  Administered 2013-08-07: 20 mg
  Filled 2013-08-07: qty 4

## 2013-08-07 MED ORDER — BIOTENE DRY MOUTH MT LIQD
15.0000 mL | Freq: Two times a day (BID) | OROMUCOSAL | Status: DC
  Administered 2013-08-07 – 2013-08-15 (×17): 15 mL via OROMUCOSAL

## 2013-08-07 MED ORDER — LABETALOL HCL 5 MG/ML IV SOLN: 10.0000 mg | INTRAVENOUS | Status: DC | PRN

## 2013-08-07 MED ORDER — AMLODIPINE BESYLATE 5 MG PO TABS
5.0000 mg | ORAL_TABLET | Freq: Every day | ORAL | Status: DC
  Administered 2013-08-07 – 2013-08-15 (×9): 5 mg via ORAL
  Filled 2013-08-07 (×9): qty 1

## 2013-08-07 MED ORDER — SENNOSIDES-DOCUSATE SODIUM 8.6-50 MG PO TABS
1.0000 | ORAL_TABLET | Freq: Two times a day (BID) | ORAL | Status: DC
  Administered 2013-08-07 – 2013-08-15 (×13): 1 via ORAL
  Filled 2013-08-07 (×16): qty 1

## 2013-08-07 MED ORDER — POTASSIUM CHLORIDE 10 MEQ/100ML IV SOLN: 10.0000 meq | INTRAVENOUS | Status: DC

## 2013-08-07 MED ORDER — ACETAMINOPHEN 650 MG RE SUPP
650.0000 mg | RECTAL | Status: DC | PRN
  Administered 2013-08-08 (×3): 650 mg via RECTAL
  Filled 2013-08-07 (×3): qty 1

## 2013-08-07 MED ORDER — ACETAMINOPHEN 325 MG PO TABS
650.0000 mg | ORAL_TABLET | ORAL | Status: DC | PRN
  Administered 2013-08-08 – 2013-08-09 (×2): 650 mg via ORAL
  Filled 2013-08-07 (×2): qty 2

## 2013-08-07 MED ORDER — PANTOPRAZOLE SODIUM 40 MG IV SOLR
40.0000 mg | Freq: Every day | INTRAVENOUS | Status: DC
  Administered 2013-08-07 – 2013-08-08 (×2): 40 mg via INTRAVENOUS
  Filled 2013-08-07 (×3): qty 40

## 2013-08-07 MED ORDER — POTASSIUM CHLORIDE CRYS ER 20 MEQ PO TBCR
30.0000 meq | EXTENDED_RELEASE_TABLET | Freq: Once | ORAL | Status: AC
  Administered 2013-08-07: 12:00:00 30 meq via ORAL
  Filled 2013-08-07: qty 1

## 2013-08-07 MED ORDER — SODIUM CHLORIDE 0.9 % IV SOLN
INTRAVENOUS | Status: DC
  Administered 2013-08-07 – 2013-08-13 (×6): via INTRAVENOUS

## 2013-08-07 MED ORDER — ONDANSETRON HCL 4 MG/2ML IJ SOLN: 4.0000 mg | Freq: Three times a day (TID) | INTRAMUSCULAR | Status: AC | PRN

## 2013-08-07 MED ORDER — LABETALOL HCL 5 MG/ML IV SOLN
20.0000 mg | Freq: Once | INTRAVENOUS | Status: AC
  Administered 2013-08-07: 20 mg via INTRAVENOUS

## 2013-08-07 MED ORDER — NICARDIPINE HCL IN NACL 20-0.86 MG/200ML-% IV SOLN
5.0000 mg/h | INTRAVENOUS | Status: DC
  Administered 2013-08-07: 5 mg/h via INTRAVENOUS

## 2013-08-07 MED ORDER — LABETALOL HCL 5 MG/ML IV SOLN
20.0000 mg | Freq: Once | INTRAVENOUS | Status: AC
  Administered 2013-08-07: 20 mg via INTRAVENOUS
  Filled 2013-08-07: qty 4

## 2013-08-07 MED ORDER — POTASSIUM CHLORIDE 10 MEQ/100ML IV SOLN
10.0000 meq | Freq: Once | INTRAVENOUS | Status: AC
  Administered 2013-08-07: 10 meq via INTRAVENOUS
  Filled 2013-08-07: qty 100

## 2013-08-07 MED ORDER — LABETALOL HCL 5 MG/ML IV SOLN: 20.0000 mg | INTRAVENOUS | Status: DC | PRN

## 2013-08-07 MED ORDER — HYDRALAZINE HCL 20 MG/ML IJ SOLN
10.0000 mg | INTRAMUSCULAR | Status: DC | PRN
  Administered 2013-08-07 – 2013-08-08 (×3): 10 mg via INTRAVENOUS
  Filled 2013-08-07 (×3): qty 1

## 2013-08-07 NOTE — Evaluation (Signed)
Clinical/Bedside Swallow Evaluation Patient Details  Name: Francisco Johnston MRN: [id number] Date of Birth: [birth year]  Today's Date: 08/07/2013 Time: 1000-1020 SLP Time Calculation (min): 20 min  Past Medical History:  Past Medical History  Diagnosis Date  . Heart disease   . Hypertension   . Cancer     colon  . Adenocarcinoma of colon 06/12/2011  . Iron deficiency anemia 06/12/2011  . CAD (coronary artery disease)   . Noncompliance   . ED (erectile dysfunction)   . IFG (impaired fasting glucose)   . Lacunar stroke    Past Surgical History:  Past Surgical History  Procedure Laterality Date  . Colectomy    . Colonoscopy     HPI:  Francisco Johnston is an 79 y.o. male, right handed, with a past medical history significant for HTN, CAD s/p MI few years ago, bilateral lacunar subcortical infarcts, colon cancer s/p colectomy, transferred to [facility] with left frontal parenchymal hemorrhage. History of dysphagia noted on RN stroke swallow screen.    Assessment / Plan / Recommendation Clinical Impression  Patient present with a moderate-severe oral phase dysphagia characterized by gross weakness resulting in decreased oral cohesion of bolus leading to decreased airway protection with thin liquids. Although no overt indication of aspiration observed with thickened liquid and puree trials, severity of neuro deficits warrant objective swallow evaluation.     Aspiration Risk  Severe    Diet Recommendation NPO except meds   Medication Administration: Crushed with puree Postural Changes and/or Swallow Maneuvers: Seated upright 90 degrees (for meds)    Other  Recommendations Recommended Consults: MBS Oral Care Recommendations: Oral care BID   Follow Up Recommendations  Inpatient Rehab    Frequency and Duration        Pertinent Vitals/Pain n/a        Swallow Study    General HPI: Francisco Johnston is an 78 y.o. male, right handed, with a past medical history significant for HTN, CAD s/p  MI few years ago, bilateral lacunar subcortical infarcts, colon cancer s/p colectomy, transferred to [facility] with left frontal parenchymal hemorrhage. History of dysphagia noted on RN stroke swallow screen.  Type of Study: Bedside swallow evaluation Previous Swallow Assessment: none noted in chart Diet Prior to this Study: NPO Temperature Spikes Noted: No Respiratory Status: Room air History of Recent Intubation: No Behavior/Cognition: Alert;Cooperative;Pleasant mood Oral Cavity - Dentition: Edentulous (unclear if patient has dentures) Self-Feeding Abilities: Able to feed self;Needs assist (? apraxia) Patient Positioning: Upright in bed Baseline Vocal Quality: Clear Volitional Cough: Wet (spontaneous cough strong) Volitional Swallow: Able to elicit    Oral/Motor/Sensory Function Overall Oral Motor/Sensory Function: Impaired Labial ROM: Reduced right;Reduced left (rigiht > left) Labial Symmetry: Abnormal symmetry right Labial Strength: Reduced Labial Sensation: Reduced Lingual ROM: Reduced right Lingual Symmetry: Abnormal symmetry right;Abnormal symmetry left Lingual Strength: Reduced Facial ROM: Reduced right Facial Symmetry: Right droop Facial Strength: Reduced Mandible: Within Functional Limits   Ice Chips Ice chips: Impaired Presentation: Spoon Oral Phase Impairments: Reduced labial seal;Impaired anterior to posterior transit;Reduced lingual movement/coordination Oral Phase Functional Implications: Prolonged oral transit   Thin Liquid Thin Liquid: Impaired Presentation: Cup;Self Fed;Straw Oral Phase Impairments: Reduced labial seal;Reduced lingual movement/coordination;Impaired anterior to posterior transit Pharyngeal  Phase Impairments: Suspected delayed Swallow;Multiple swallows;Cough - Immediate;Cough - Delayed    Nectar Thick Nectar Thick Liquid: Impaired Presentation: Spoon Oral Phase Impairments: Reduced lingual movement/coordination Pharyngeal Phase Impairments: Suspected  delayed Swallow   Honey Thick Honey Thick Liquid: Not tested   Puree  Puree: Impaired Presentation: Spoon Oral Phase Impairments: Reduced lingual movement/coordination;Impaired anterior to posterior transit Oral Phase Functional Implications: Right anterior spillage;Prolonged oral transit;Oral residue (mild oral residue)   Solid   Francisco Johnston   Francisco Lizardo MA, CCC-SLP [phone]  Solid: Not tested       Francisco Johnston Francisco Johnston 08/07/2013,10:47 AM

## 2013-08-07 NOTE — Procedures (Signed)
Objective Swallowing Evaluation: Modified Barium Swallowing Study  Patient Details  Name: ABHISHEK LEVESQUE MRN: [id number] Date of Birth: 02-Sep-1934  Today's Date: 08/07/2013 Time: 1100-1130 SLP Time Calculation (min): 30 min  Past Medical History:  Past Medical History  Diagnosis Date  . Heart disease   . Hypertension   . Cancer     colon  . Adenocarcinoma of colon 06/12/2011  . Iron deficiency anemia 06/12/2011  . CAD (coronary artery disease)   . Noncompliance   . ED (erectile dysfunction)   . IFG (impaired fasting glucose)   . Lacunar stroke    Past Surgical History:  Past Surgical History  Procedure Laterality Date  . Colectomy    . Colonoscopy     HPI:  Aviva Kluver Taft is an 79 y.o. male, right handed, with a past medical history significant for HTN, CAD s/p MI few years ago, bilateral lacunar subcortical infarcts, colon cancer s/p colectomy, transferred to [facility] with left frontal parenchymal hemorrhage. History of dysphagia noted on RN stroke swallow screen.      Assessment / Plan / Recommendation Clinical Impression  Dysphagia Diagnosis: Moderate oral phase dysphagia;Moderate pharyngeal phase dysphagia Clinical impression: Patient presents with a primary moderate oral dysphagia with a resultant pharyngeal component. Oral phase characterized by weakness and loss of sensation resulting in anterior bolus loss, oral residue, delayed oral transit and decrease bolus cohesion as well as a delayed swallow initiation leading to aspiration of thin liquids. Cough response weak however multiple spontaneous dry swallows eventually effective at clearing aspirates. Therapeutic intervention included cueing for consumption of thin liquids via single sips, utilizing a straw and chin tuck which prevented aspiration during todays study. Recommend initiation of a dysphagia 1 diet, thin liquids with strict use of compensatory strategies to decrease aspiration risk.     Treatment Recommendation   Therapy as outlined in treatment plan below    Diet Recommendation Dysphagia 1 (Puree);Thin liquid   Liquid Administration via: Straw Medication Administration: Crushed with puree Supervision: Patient able to self feed;Full supervision/cueing for compensatory strategies Compensations: Slow rate;Small sips/bites Postural Changes and/or Swallow Maneuvers: Chin tuck;Seated upright 90 degrees    Other  Recommendations Oral Care Recommendations: Oral care BID   Follow Up Recommendations  Inpatient Rehab    Frequency and Duration min 2x/week  2 weeks   Pertinent Vitals/Pain none    SLP Swallow Goals Patient will utilize recommended strategies during swallow to increase swallowing safety with: Minimal assistance Swallow Study Goal #2 - Progress: Not met   General HPI: ames L Prout is an 79 y.o. male, right handed, with a past medical history significant for HTN, CAD s/p MI few years ago, bilateral lacunar subcortical infarcts, colon cancer s/p colectomy, transferred to [facility] with left frontal parenchymal hemorrhage. History of dysphagia noted on RN stroke swallow screen.  Type of Study: Modified Barium Swallowing Study Reason for Referral: Objectively evaluate swallowing function Previous Swallow Assessment: none noted in chart Diet Prior to this Study: NPO (except meds) Temperature Spikes Noted: No Respiratory Status: Room air History of Recent Intubation: No Behavior/Cognition: Alert;Cooperative;Pleasant mood Oral Cavity - Dentition: Edentulous Oral Motor / Sensory Function: Impaired - see Bedside swallow eval Self-Feeding Abilities: Able to feed self;Needs assist Patient Positioning: Upright in chair Baseline Vocal Quality: Clear Volitional Cough: Weak Volitional Swallow: Able to elicit Anatomy: Within functional limits Pharyngeal Secretions: Not observed secondary MBS    Reason for Referral Objectively evaluate swallowing function   Oral Phase Oral Preparation/Oral  Phase Oral Phase: Impaired  Oral - Nectar Oral - Nectar Teaspoon: Right anterior bolus loss Oral - Nectar Cup: Right anterior bolus loss Oral - Thin Oral - Thin Cup: Right anterior bolus loss Oral - Thin Straw: Right anterior bolus loss Oral - Solids Oral - Puree: Right anterior bolus loss Oral - Mechanical Soft: Delayed oral transit   Pharyngeal Phase Pharyngeal Phase Pharyngeal Phase: Impaired Pharyngeal - Nectar Pharyngeal - Nectar Teaspoon: Delayed swallow initiation;Premature spillage to valleculae Pharyngeal - Nectar Cup: Delayed swallow initiation;Premature spillage to valleculae Pharyngeal - Thin Pharyngeal - Thin Cup: Delayed swallow initiation;Premature spillage to valleculae;Premature spillage to pyriform sinuses;Trace aspiration;Pharyngeal residue - pyriform sinuses;Penetration/Aspiration before swallow Penetration/Aspiration details (thin cup): Material enters airway, passes BELOW cords and not ejected out despite cough attempt by patient Pharyngeal - Thin Straw: Delayed swallow initiation;Premature spillage to valleculae (chin tuck prevented aspiration/penetration) Pharyngeal - Solids Pharyngeal - Puree: Delayed swallow initiation;Premature spillage to valleculae;Pharyngeal residue - pyriform sinuses (pyriform sinus residue eliminated with head neutral position) Pharyngeal - Mechanical Soft: Delayed swallow initiation;Premature spillage to valleculae  Cervical Esophageal Phase    GO   Ferdinand Lango MA, CCC-SLP [phone]  Cervical Esophageal Phase Cervical Esophageal Phase: [facility]         Montrey Buist Meryl 08/07/2013, 2:19 PM

## 2013-08-07 NOTE — Progress Notes (Signed)
Stroke Team Progress Note  HISTORY  Francisco Johnston is an 79 y.o. male, right handed, with a past medical history significant for HTN, CAD s/p MI few years ago, bilateral lacunar subcortical infarcts, colon cancer s/p colectomy, transferred to [facility] for further evaluation and management of left frontal ICH.  Patient is not able to provide information about his current situation, but family is at bedside and stated that around 11:30 pm last night Francisco Johnston was watching TV and suddenly developed difficulty talking, slurred speech, right face weakness, and right sided weakness. His family took him to [facility] ED where he was found o have " dysarthric speech, mild right facial droop, and dense right hemiparesis arm 1/5 and leg 3/5. Right toe up going and BP 186/139".   An emergent CT brain reveled a 3 cm posterior left frontal intraparenchymal hematoma without extension into the ventricles, mass effect, or hydrocephalus. No recent fall, head trauma, or use of anticoagulants. Upon evaluation, he is awake, follows simple commands, and denies HA, vertigo, double vision, chest pain, shortness of breath, or palpitations.  SUBJECTIVE Patient lying in bed. Mumbling. Speech therapy to evaluate. No new symptoms.  OBJECTIVE Most recent Vital Signs: Filed Vitals:   08/07/13 0815 08/07/13 0830 08/07/13 0845 08/07/13 0900  BP: 166/106 156/82 142/81 146/83  Pulse: 87 64 60 79  Temp:      TempSrc:      Resp: 19 25 16 17   Height:      Weight:      SpO2: 96% 97% 96% 99%   CBG (last 3)   Recent Labs  08/07/13  GLUCAP 113*    IV Fluid Intake:   . sodium chloride    . niCARDipine    . niCARDipine Stopped (08/07/13 0725)    MEDICATIONS  . amLODipine  5 mg Oral Daily  . antiseptic oral rinse  15 mL Mouth Rinse BID  . pantoprazole (PROTONIX) IV  40 mg Intravenous QHS  . potassium chloride  10 mEq Intravenous Q1 Hr x 3  . senna-docusate  1 tablet Oral BID   PRN:  acetaminophen, acetaminophen,  labetalol, labetalol, ondansetron (ZOFRAN) IV  Diet:  NPO  Activity:  Bedrest DVT Prophylaxis:  SCD  CLINICALLY SIGNIFICANT STUDIES Basic Metabolic Panel:   Recent Labs Lab 08/07/13 0001  NA 137  K 3.0*  CL 98  CO2 29  GLUCOSE 116*  BUN 5*  CREATININE 0.80  CALCIUM 9.6   Liver Function Tests:   Recent Labs Lab 08/07/13 0001  AST 17  ALT 8  ALKPHOS 99  BILITOT 0.6  PROT 8.5*  ALBUMIN 4.1   CBC:   Recent Labs Lab 08/07/13 0001  WBC 5.3  NEUTROABS 2.7  HGB 15.4  HCT 45.5  MCV 86.7  PLT 165   Coagulation:   Recent Labs Lab 08/07/13 0001  LABPROT 13.5  INR 1.05   Cardiac Enzymes:   Recent Labs Lab 08/07/13 0001  TROPONINI <0.30   Urinalysis:   Recent Labs Lab 08/07/13 0008  COLORURINE YELLOW  LABSPEC 1.015  PHURINE 7.0  GLUCOSEU NEGATIVE  HGBUR TRACE*  BILIRUBINUR NEGATIVE  KETONESUR NEGATIVE  PROTEINUR 100*  UROBILINOGEN 0.2  NITRITE NEGATIVE  LEUKOCYTESUR NEGATIVE   Lipid Panel    Component Value Date/Time   CHOL  Value: 55        ATP III CLASSIFICATION:  <200     mg/dL   Desirable  147-829  mg/dL   Borderline High  >=562    mg/dL  High        07/07/2010 0603   TRIG 91 07/07/2010 0603   HDL 32* 07/07/2010 0603   CHOLHDL 1.7 07/07/2010 0603   VLDL 18 07/07/2010 0603   LDLCALC  Value: 5        Total Cholesterol/HDL:CHD Risk Coronary Heart Disease Risk Table                     Men   Women  1/2 Average Risk   3.4   3.3  Average Risk       5.0   4.4  2 X Average Risk   9.6   7.1  3 X Average Risk  23.4   11.0        Use the calculated Patient Ratio above and the CHD Risk Table to determine the patient's CHD Risk.        ATP III CLASSIFICATION (LDL):  <100     mg/dL   Optimal  782-956  mg/dL   Near or Above                    Optimal  130-159  mg/dL   Borderline  213-086  mg/dL   High  >578     mg/dL   Very High 03/12/9628 5284   HgbA1C  Lab Results  Component Value Date   HGBA1C 5.5 04/05/2013    Urine Drug Screen:     Component Value  Date/Time   LABOPIA NONE DETECTED 08/07/2013 0008   COCAINSCRNUR NONE DETECTED 08/07/2013 0008   LABBENZ NONE DETECTED 08/07/2013 0008   AMPHETMU NONE DETECTED 08/07/2013 0008   THCU NONE DETECTED 08/07/2013 0008   LABBARB NONE DETECTED 08/07/2013 0008    Alcohol Level:   Recent Labs Lab 08/07/13 0001  ETH <11    Ct Head Wo Contrast 08/07/2013   : 1.Three cm posterior left frontal intraparenchymal hematoma.  2. Brain atrophy and extensive chronic small vessel ischemic injury.     MRI of the brain    MRA of the brain    2D Echocardiogram    Carotid Doppler    CXR    EKG  NSR   Therapy Recommendations   Physical Exam   Gen: NAD Head: normocephalic.  Neck: supple, no bruits  Cardiac: no murmurs.  Lungs: clear, coarse bilateral breath sounds Abdomen: soft, no tender, no mass.  Extremities: bilateral LE pitting edema.   Mental Status:  Alert, awake, oriented to place and year. Dysarthric. Follows simple commands, neglects commands on R side Cranial Nerves:  PERRL, EOMI, no ptosis, R facial droop, hearing intact bilat to conversation, cough present, tongue midline  Motor:  Moves Left side spontaneously and against light resistance Dense right HP  Tone: diminished in the right side.  Sensory: LT intact left side, withdraws RUE weakly and RLE briskly to noxious stimuli Deep Tendon Reflexes:  1 all over  Plantars:  Right: upgoing Left: downgoing  Gait: bed rest  ASSESSMENT Francisco Johnston is a 79 y.o. male presenting with right hemiparesis, right facial droop, dysarthria.. Imaging confirms a left frontal ICH. Infarct felt to be hemorrhagic in nature.  On aspirin 81 mg orally every day prior to admission. Now on no antithrombotics for secondary stroke prevention. Patient with resultant right hemiparesis, dysarthria. Work up underway.   Hypertension  Colon cancer  Non compliance  Hypokalemia, resolved  Hospital day # 1  TREATMENT/PLAN  Continue no antithrombotics  for secondary stroke prevention.  Add  back norvasc. (patient was on maxzide, did not reorder due to hypokalemia)  Replete potassium  Recheck potassium and magnesium levels  Therapy evals  Risk factor modification  Gwendolyn Lima. Manson Passey, [facility], MBA, MHA [facility] Pager: [phone] 08/07/2013 10:02 AM  I have personally obtained a history, examined the patient, evaluated imaging results, and formulated the assessment and plan of care. I agree with the above.  Elspeth Cho, DO Neurology-Stroke

## 2013-08-07 NOTE — Consult Note (Signed)
Referring Physician: [facility]    Chief Complaint: right hemiparesis, right face droopiness, language impairment. CT with left ICH  HPI:                                                                                                                                         Francisco Johnston is an 79 y.o. male, right handed, with a past medical history significant for HTN, CAD s/p MI few years ago, bilateral lacunar subcortical infarcts, colon cancer s/p colectomy, transferred to [facility] for further evaluation and management of left frontal ICH. Patient is not able to provide information about his current situation, but family is at bedside and stated that around 11:30 pm last night Mr. Dubie was watching TV and suddenly developed difficulty talking, slurred speech, right face weakness, and right sided weakness. His family took him to [facility] ED where he was found o have " dysarthric speech, mild right facial droop, and dense right hemiparesis arm 1/5 and leg 3/5. Right toe up going and BP 186/139". An emergent CT brain reveled a 3 cm posterior left frontal intraparenchymal hematoma without extension into the ventricles, mass effect, or hydrocephalus.  No recent fall, head trauma, or use of anticoagulants. Presently, he is awake, follows simple commands, and denies HA, vertigo, double vision, chest pain, shortness of breath, or palpitations.  Date last known well: 08/06/13 Time last known well: 11:30 pm tPA Given: no, ICH NIHSS: deferred.  MRS: 3  Past Medical History  Diagnosis Date  . Heart disease   . Hypertension   . Cancer     colon  . Adenocarcinoma of colon 06/12/2011  . Iron deficiency anemia 06/12/2011  . CAD (coronary artery disease)   . Noncompliance   . ED (erectile dysfunction)   . IFG (impaired fasting glucose)   . Lacunar stroke     Past Surgical History  Procedure Laterality Date  . Colectomy    . Colonoscopy      Family History  Problem Relation Age of Onset  .  Cancer Father   . Cancer Sister    Social History:  reports that he has never smoked. His smokeless tobacco use includes Snuff. He reports that  drinks alcohol. He reports that he does not use illicit drugs.  Allergies: No Known Allergies  Medications:  I have reviewed the patient's current medications.  ROS:                                                                                                                                       History obtained from chart review and family.  General ROS: negative for - chills, fatigue, fever, night sweats, weight gain or weight loss Psychological ROS: negative for - behavioral disorder, hallucinations, memory difficulties, mood swings or suicidal ideation Ophthalmic ROS: negative for - blurry vision, double vision, eye pain or loss of vision ENT ROS: negative for - epistaxis, nasal discharge, oral lesions, sore throat, tinnitus or vertigo Allergy and Immunology ROS: negative for - hives or itchy/watery eyes Hematological and Lymphatic ROS: negative for - bleeding problems, bruising or swollen lymph nodes Endocrine ROS: negative for - galactorrhea, hair pattern changes, polydipsia/polyuria or temperature intolerance Respiratory ROS: negative for - cough, hemoptysis, shortness of breath or wheezing Cardiovascular ROS: negative for - chest pain, dyspnea on exertion, edema or irregular heartbeat Gastrointestinal ROS: negative for - abdominal pain, diarrhea, hematemesis, nausea/vomiting or stool incontinence Genito-Urinary ROS: negative for - dysuria, hematuria, incontinence or urinary frequency/urgency Musculoskeletal ROS: negative for - joint swelling but positive bilateral pitting edema Neurological ROS: as noted in HPI Dermatological ROS: negative for rash and skin lesion changes    Physical exam: pleasant male in no  apparent distress. Blood pressure 194/100, pulse 51, temperature 98.8 F (37.1 C), temperature source Axillary, resp. rate 21, height 5\' 11"  (1.803 m), weight 71 kg (156 lb 8.4 oz), SpO2 99.00%. Head: normocephalic. Neck: supple, no bruits, no JVD. Cardiac: no murmurs. Lungs: clear. Abdomen: soft, no tender, no mass. Extremities: bilateral pitting edema.   Neurologic Examination:                                                                                                      Mental Status: Alert, awake, oriented to place and year.  Dysarthric.  Able to follow 3 step commands without difficulty. Cranial Nerves: II: Discs flat bilaterally; Visual fields grossly normal, pupils equal, round, reactive to light and accommodation III,IV, VI: ptosis not present, extra-ocular motions intact bilaterally V: facial light touch sensation normal bilaterally. VII: right face weakness VIII: hearing normal bilaterally IX,X: gag reflex present XI: no tested XII: midline tongue extension Motor: Dense right HP Tone: diminished in the right side. Sensory: Pinprick and light touch intact throughout, bilaterally Deep Tendon Reflexes:  1 all over Plantars: Right: upgoing   Left:  downgoing Cerebellar: Unable to test Gait:  Unable to test CV: pulses palpable throughout    Results for orders placed during the hospital encounter of 08/06/13 (from the past 48 hour(s))  GLUCOSE, CAPILLARY     Status: Abnormal   Collection Time    08/07/13 12:00 AM      Result Value Range   Glucose-Capillary 113 (*) 70 - 99 mg/dL  ETHANOL     Status: None   Collection Time    08/07/13 12:01 AM      Result Value Range   Alcohol, Ethyl (B) <11  0 - 11 mg/dL   Comment:            LOWEST DETECTABLE LIMIT FOR     SERUM ALCOHOL IS 11 mg/dL     FOR MEDICAL PURPOSES ONLY  PROTIME-INR     Status: None   Collection Time    08/07/13 12:01 AM      Result Value Range   Prothrombin Time 13.5  11.6 - 15.2 seconds    INR 1.05  0.00 - 1.49  APTT     Status: None   Collection Time    08/07/13 12:01 AM      Result Value Range   aPTT 24  24 - 37 seconds  CBC     Status: None   Collection Time    08/07/13 12:01 AM      Result Value Range   WBC 5.3  4.0 - 10.5 K/uL   RBC 5.25  4.22 - 5.81 MIL/uL   Hemoglobin 15.4  13.0 - 17.0 g/dL   HCT 16.1  09.6 - 04.5 %   MCV 86.7  78.0 - 100.0 fL   MCH 29.3  26.0 - 34.0 pg   MCHC 33.8  30.0 - 36.0 g/dL   RDW 40.9  81.1 - 91.4 %   Platelets 165  150 - 400 K/uL  DIFFERENTIAL     Status: None   Collection Time    08/07/13 12:01 AM      Result Value Range   Neutrophils Relative % 50  43 - 77 %   Neutro Abs 2.7  1.7 - 7.7 K/uL   Lymphocytes Relative 39  12 - 46 %   Lymphs Abs 2.1  0.7 - 4.0 K/uL   Monocytes Relative 7  3 - 12 %   Monocytes Absolute 0.4  0.1 - 1.0 K/uL   Eosinophils Relative 3  0 - 5 %   Eosinophils Absolute 0.2  0.0 - 0.7 K/uL   Basophils Relative 0  0 - 1 %   Basophils Absolute 0.0  0.0 - 0.1 K/uL  COMPREHENSIVE METABOLIC PANEL     Status: Abnormal   Collection Time    08/07/13 12:01 AM      Result Value Range   Sodium 137  135 - 145 mEq/L   Potassium 3.0 (*) 3.5 - 5.1 mEq/L   Chloride 98  96 - 112 mEq/L   CO2 29  19 - 32 mEq/L   Glucose, Bld 116 (*) 70 - 99 mg/dL   BUN 5 (*) 6 - 23 mg/dL   Creatinine, Ser 7.82  0.50 - 1.35 mg/dL   Calcium 9.6  8.4 - 95.6 mg/dL   Total Protein 8.5 (*) 6.0 - 8.3 g/dL   Albumin 4.1  3.5 - 5.2 g/dL   AST 17  0 - 37 U/L   ALT 8  0 - 53 U/L   Alkaline Phosphatase 99  39 -  117 U/L   Total Bilirubin 0.6  0.3 - 1.2 mg/dL   GFR calc non Af Amer 85 (*) >90 mL/min   GFR calc Af Amer >90  >90 mL/min   Comment: (NOTE)     The eGFR has been calculated using the CKD EPI equation.     This calculation has not been validated in all clinical situations.     eGFR's persistently <90 mL/min signify possible Chronic Kidney     Disease.  TROPONIN I     Status: None   Collection Time    08/07/13 12:01 AM       Result Value Range   Troponin I <0.30  <0.30 ng/mL   Comment:            Due to the release kinetics of cTnI,     a negative result within the first hours     of the onset of symptoms does not rule out     myocardial infarction with certainty.     If myocardial infarction is still suspected,     repeat the test at appropriate intervals.  URINE RAPID DRUG SCREEN (HOSP PERFORMED)     Status: None   Collection Time    08/07/13 12:08 AM      Result Value Range   Opiates NONE DETECTED  NONE DETECTED   Cocaine NONE DETECTED  NONE DETECTED   Benzodiazepines NONE DETECTED  NONE DETECTED   Amphetamines NONE DETECTED  NONE DETECTED   Tetrahydrocannabinol NONE DETECTED  NONE DETECTED   Barbiturates NONE DETECTED  NONE DETECTED   Comment:            DRUG SCREEN FOR MEDICAL PURPOSES     ONLY.  IF CONFIRMATION IS NEEDED     FOR ANY PURPOSE, NOTIFY LAB     WITHIN 5 DAYS.                LOWEST DETECTABLE LIMITS     FOR URINE DRUG SCREEN     Drug Class       Cutoff (ng/mL)     Amphetamine      1000     Barbiturate      200     Benzodiazepine   200     Tricyclics       300     Opiates          300     Cocaine          300     THC              50  URINALYSIS, ROUTINE W REFLEX MICROSCOPIC     Status: Abnormal   Collection Time    08/07/13 12:08 AM      Result Value Range   Color, Urine YELLOW  YELLOW   APPearance CLEAR  CLEAR   Specific Gravity, Urine 1.015  1.005 - 1.030   pH 7.0  5.0 - 8.0   Glucose, UA NEGATIVE  NEGATIVE mg/dL   Hgb urine dipstick TRACE (*) NEGATIVE   Bilirubin Urine NEGATIVE  NEGATIVE   Ketones, ur NEGATIVE  NEGATIVE mg/dL   Protein, ur 161 (*) NEGATIVE mg/dL   Urobilinogen, UA 0.2  0.0 - 1.0 mg/dL   Nitrite NEGATIVE  NEGATIVE   Leukocytes, UA NEGATIVE  NEGATIVE  URINE MICROSCOPIC-ADD ON     Status: None   Collection Time    08/07/13 12:08 AM      Result Value Range   Squamous  Epithelial / LPF RARE  RARE   WBC, UA 0-2  <3 WBC/hpf   RBC / HPF 0-2  <3 RBC/hpf    Bacteria, UA RARE  RARE   Ct Head Wo Contrast  08/07/2013   *RADIOLOGY REPORT*  Clinical Data: A aphasia.  Code stroke.  CT HEAD WITHOUT CONTRAST  Technique:  Contiguous axial images were obtained from the base of the skull through the vertex without contrast.  Comparison: 08/27/2008.  Findings:  Skull:No acute osseous abnormality.  Chronic flattening of the nasal bridge.  Orbits: No acute abnormality.  Brain:  3 cm hematoma within the subcortical posterior left frontal lobe.  No surrounding edema currently.  No subarachnoid or intraventricular extension.  Extensive chronic small vessel ischemic changes with bilateral deep grey lacunar infarctions and patchy bilateral cerebral white matter ischemic injuries, particularly around the frontal horns.  There is global brain atrophy, age advanced.  No hydrocephalus, shift, or herniation.  Critical Value/emergent results were called by telephone at the time of interpretation on 08/07/2013 at 0020 hours to Dr Preston Fleeting, who verbally acknowledged these results.  IMPRESSION: 1.Three cm posterior left frontal intraparenchymal hematoma.  2. Brain atrophy and extensive chronic small vessel ischemic injury.   Original Report Authenticated By: Tiburcio Pea     Assessment: 79 y.o. male with left frontal parenchymal hemorrhage. Mental status appropriate at this time, but dysarthria with dense right HP. Supportive treatment. Still with SBP>200 and bradycardia, thus will initiate IV Cardene drip with goal SBP<160. Further neuro-imaging as per stroke team recommendations/protocol. PT.   Stroke Risk Factors -age, race, HTN.  Wyatt Portela, MD [facility] [phone]  08/07/2013, 3:57 AM

## 2013-08-07 NOTE — Progress Notes (Signed)
UR completed 

## 2013-08-07 NOTE — Evaluation (Signed)
Speech Language Pathology Evaluation Patient Details Name: Francisco Johnston MRN: [id number] DOB: 08-15-1934 Today's Date: 08/07/2013 Time: 1020-1040 SLP Time Calculation (min): 20 min  Problem List:  Patient Active Problem List   Diagnosis Date Noted  . History of prostate cancer 04/12/2013  . HTN (hypertension) 04/05/2013  . Chronic ischemic heart disease, unspecified 04/05/2013  . CVA (cerebral vascular accident) 04/05/2013  . Adenocarcinoma of colon 06/12/2011  . Iron deficiency anemia 06/12/2011   Past Medical History:  Past Medical History  Diagnosis Date  . Heart disease   . Hypertension   . Cancer     colon  . Adenocarcinoma of colon 06/12/2011  . Iron deficiency anemia 06/12/2011  . CAD (coronary artery disease)   . Noncompliance   . ED (erectile dysfunction)   . IFG (impaired fasting glucose)   . Lacunar stroke    Past Surgical History:  Past Surgical History  Procedure Laterality Date  . Colectomy    . Colonoscopy     HPI:  Francisco Johnston is an 79 y.o. male, right handed, with a past medical history significant for HTN, CAD s/p MI few years ago, bilateral lacunar subcortical infarcts, colon cancer s/p colectomy, transferred to [facility] with left frontal parenchymal hemorrhage. History of dysphagia noted on RN stroke swallow screen.    Assessment / Plan / Recommendation Clinical Impression  Cognitive-linguistic evaluation complete. Patient presents with severe dysarthria with components of expressive aphasia and suspected apraxia. Mild receptive aphasia additionally noted as well as possible ideational apraxia. Therapuetic intervention included moderate-max phonemic and sentence completion cues as well as visual cueing which were successful at increasing effectiveness of communication.     SLP Assessment  Patient needs continued Speech Lanaguage Pathology Services    Follow Up Recommendations  Inpatient Rehab    Frequency and Duration min 2x/week  2 weeks    Pertinent Vitals/Pain none   SLP Goals  SLP Goals Potential to Achieve Goals: Good Progress/Goals/Alternative treatment plan discussed with pt/caregiver and they: Agree SLP Goal #1: Patient will verbally communicate basic needs/wants with moderate SLP cues SLP Goal #1 - Progress: Not met SLP Goal #2: Patient will increase speech intelligibility to 75% during basic conversation with moderate cues for overarticulation.  SLP Goal #2 - Progress: Not met SLP Goal #3: Patient will follow basic 1 step commands during functional ADL with min cues.  SLP Goal #3 - Progress: Not met  SLP Evaluation Prior Functioning  Cognitive/Linguistic Baseline: Information not available  Lives With: Spouse;Family   Cognition  Arousal/Alertness: Awake/alert Orientation Level: Oriented to person;Oriented to place;Oriented to time;Oriented to situation Focused Attention: Appears intact Sustained Attention: Appears intact Memory:  (TBD pending improved communication skills) Awareness: Appears intact (for basic intellectual awareness of situation only. ) Problem Solving: Appears intact (for very basic functional task ) Comments: will continue to assess cognitive status during functional task and pending improved communication skills    Comprehension  Auditory Comprehension Overall Auditory Comprehension: Impaired Yes/No Questions: Impaired Basic Biographical Questions: 76-100% accurate Basic Immediate Environment Questions: 75-100% accurate Commands: Impaired One Step Basic Commands: 50-74% accurate Conversation: Simple Interfering Components:  (suspect components of apraxia, aphasia, and dysarthria) EffectiveTechniques: Visual/Gestural cues Visual Recognition/Discrimination Discrimination: Within Function Limits Reading Comprehension Reading Status: Not tested    Expression Expression Primary Mode of Expression: Verbal Verbal Expression Overall Verbal Expression: Impaired Initiation: No  impairment Automatic Speech: Social Response;Name Level of Generative/Spontaneous Verbalization: Sentence Repetition: Impaired Level of Impairment: Word level (articulation errors) Naming: Impairment Confrontation: Impaired Convergent:  50-74% accurate Verbal Errors: Not aware of errors;Perseveration Interfering Components: Speech intelligibility Effective Techniques: Sentence completion;Phonemic cues Written Expression Dominant Hand: Right Written Expression: Not tested   Oral / Motor Oral Motor/Sensory Function Overall Oral Motor/Sensory Function: Impaired Labial ROM: Reduced right;Reduced left Labial Symmetry: Abnormal symmetry right Labial Strength: Reduced Labial Sensation: Reduced Lingual ROM: Reduced right Lingual Symmetry: Abnormal symmetry right;Abnormal symmetry left Lingual Strength: Reduced Facial ROM: Reduced right Facial Symmetry: Right droop Facial Strength: Reduced Mandible: Within Functional Limits Motor Speech Overall Motor Speech: Impaired Respiration: Within functional limits Phonation: Normal Resonance: Within functional limits Articulation: Impaired Level of Impairment: Word Intelligibility: Intelligibility reduced Word: 0-24% accurate Phrase: 0-24% accurate Sentence: 0-24% accurate Motor Planning: Impaired Level of Impairment: Word Motor Speech Errors: Groping for words (minimal) Effective Techniques: Slow rate;Increased vocal intensity;Over-articulate   GO    Ferdinand Lango MA, CCC-SLP [phone]  [name] 08/07/2013, 11:02 AM

## 2013-08-08 ENCOUNTER — Inpatient Hospital Stay (HOSPITAL_COMMUNITY): Payer: Medicare Other

## 2013-08-08 DIAGNOSIS — I619 Nontraumatic intracerebral hemorrhage, unspecified: Principal | ICD-10-CM

## 2013-08-08 LAB — BASIC METABOLIC PANEL
BUN: 4 mg/dL — ABNORMAL LOW (ref 6–23)
CO2: 29 mEq/L (ref 19–32)
Calcium: 9.2 mg/dL (ref 8.4–10.5)
Chloride: 102 mEq/L (ref 96–112)
Creatinine, Ser: 0.69 mg/dL (ref 0.50–1.35)
GFR calc Af Amer: >90 mL/min (ref >90)
GFR calc non Af Amer: 90 mL/min — ABNORMAL LOW (ref >90)
Glucose, Bld: 118 mg/dL — ABNORMAL HIGH (ref 70–99)
Potassium: 3 mEq/L — ABNORMAL LOW (ref 3.5–5.1)
Sodium: 139 mEq/L (ref 135–145)

## 2013-08-08 MED ORDER — POTASSIUM CHLORIDE 10 MEQ/100ML IV SOLN
10.0000 meq | INTRAVENOUS | Status: AC
  Administered 2013-08-08 (×4): 10 meq via INTRAVENOUS
  Filled 2013-08-08 (×4): qty 100

## 2013-08-08 MED ORDER — LORAZEPAM 2 MG/ML IJ SOLN
INTRAMUSCULAR | Status: AC
  Filled 2013-08-08: qty 1

## 2013-08-08 MED ORDER — VALPROATE SODIUM 500 MG/5ML IV SOLN
500.0000 mg | Freq: Three times a day (TID) | INTRAVENOUS | Status: DC
  Administered 2013-08-09 – 2013-08-10 (×4): 500 mg via INTRAVENOUS
  Filled 2013-08-08 (×12): qty 5

## 2013-08-08 MED ORDER — VALPROATE SODIUM 500 MG/5ML IV SOLN
1.0000 g | INTRAVENOUS | Status: AC
  Administered 2013-08-08: 1000 mg via INTRAVENOUS
  Filled 2013-08-08: qty 10

## 2013-08-08 MED ORDER — VALPROATE SODIUM 500 MG/5ML IV SOLN
1.0000 g | Freq: Once | INTRAVENOUS | Status: AC
  Administered 2013-08-08: 1000 mg via INTRAVENOUS
  Filled 2013-08-08: qty 10

## 2013-08-08 MED ORDER — IOHEXOL 350 MG/ML SOLN
50.0000 mL | Freq: Once | INTRAVENOUS | Status: AC | PRN
  Administered 2013-08-08: 50 mL via INTRAVENOUS

## 2013-08-08 MED ORDER — LORAZEPAM 2 MG/ML IJ SOLN
1.0000 mg | Freq: Once | INTRAMUSCULAR | Status: AC
  Administered 2013-08-08: 1 mg via INTRAVENOUS

## 2013-08-08 MED ORDER — HYDRALAZINE HCL 20 MG/ML IJ SOLN
10.0000 mg | INTRAMUSCULAR | Status: DC | PRN
  Administered 2013-08-11 – 2013-08-13 (×2): 10 mg via INTRAVENOUS
  Filled 2013-08-08 (×3): qty 1

## 2013-08-08 NOTE — Progress Notes (Signed)
Pt now lethargic, can waken with effort. Sluggish response. Eyes no longer deviated right, gaze to the left. RUE bicep twitch, goose bumps (cutis anserina) present. Axillary temp per RN 102. UA and CXR neg. WBC yest am ok. Feels warm, but not hot to touch. Right hemiparesis face/arm/leg remains.  -Blood Cx x 2 -CBC in am -f/u EEG and valproic acid level  Low threshold for unit transfer if re-seizes.  Discussed with Dr. Pearlean Brownie. Signed out to Dr. Thad Ranger. Marissa RN aware of plan

## 2013-08-08 NOTE — Progress Notes (Signed)
At [phone] RN in room and pt began seizing. Turned head to right, had deviation of eyes to right and generalized shaking. seizure lasted 45 seconds. MD paged.rapid response called. At [phone] pt experienced a second seizure lasting one minute. MD paged again. Orders given.

## 2013-08-08 NOTE — Progress Notes (Signed)
EEG Completed; Results Pending  

## 2013-08-08 NOTE — Progress Notes (Signed)
Temp 102.4, tylenol 650mg  per rectum given at 2220. Dinner warmed up and fed to patient, consumed about 80% of entree, 100% of magic cup and 100% of nectar thick juice. Remains on oxygen via nasal cannula, condom catheter, turn and repositioned. HOB elevated Son called and updated on patient status.

## 2013-08-08 NOTE — Procedures (Addendum)
ELECTROENCEPHALOGRAM REPORT   Patient: MAJOR SANTERRE       Room #: 4U98  Age: 79 y.o.        Sex: male Referring Physician: Pearlean Brownie Report Date:  08/08/2013        Interpreting Physician: Thana Farr D  History: REMER COUSE is an 79 y.o. male with stroke and new onset seizures  Medications:  Scheduled: . amLODipine  5 mg Oral Daily  . antiseptic oral rinse  15 mL Mouth Rinse BID  . pantoprazole (PROTONIX) IV  40 mg Intravenous QHS  . potassium chloride  10 mEq Intravenous Q1 Hr x 4  . senna-docusate  1 tablet Oral BID  . valproate sodium  500 mg Intravenous Q8H    Conditions of Recording:  This is a 16 channel EEG carried out with the patient in the lethargic state.  Description:  The background is marred by muscle and movement artifact.  Due to the artifact no background activity can be evaluated.  No large amplitude epileptiform activity could be appreciated.   Hyperventilation and intermittent photic stimulation were not performed.   IMPRESSION: This is a technically difficult record due to the predominence of muscle and movement artifact.  May consider repeat of study if clinically indicated.     Thana Farr, MD [facility] [phone] 08/08/2013, 7:42 PM

## 2013-08-08 NOTE — Progress Notes (Signed)
Speech Language Pathology Dysphagia Treatment Patient Details Name: Francisco Johnston MRN: [id number] DOB: 11-Sep-1934 Today's Date: 08/08/2013 Time: 0102-7253 SLP Time Calculation (min): 25 min  Assessment / Plan / Recommendation Clinical Impression  RN reports pt. has not tolerated thin liquids (coughing after swallowing OJ).  MBS reviewed, and pt. did aspirated unless using a chin tuck.  RN reports pt. is unable to f/c's for compensatory strategies, even with max assist, verbal and tactile cues.  Pt. with temp spike to 102.4, and had seizures earlier today, then received ativan.  Currently pt. is lethargic, but awakens briefly to stimuli.  Pt. refused to accept po's, clamping lips together tightly.  Will downgrade diet to Nectar thick, and feed when alert.      Diet Recommendation  Continue with Current Diet: Dysphagia 1 (puree) Initiate / Change Diet: Nectar-thick liquid    SLP Plan Goals updated   Pertinent Vitals/Pain n/a   Swallowing Goals  SLP Swallowing Goals Patient will consume recommended diet without observed clinical signs of aspiration with: Maximum assistance Swallow Study Goal #1 - Progress: Not Met Patient will utilize recommended strategies during swallow to increase swallowing safety with: Maximum assistance Swallow Study Goal #2 - Progress: Not met  General Temperature Spikes Noted: Yes (102.4) Respiratory Status: Room air Behavior/Cognition: Lethargic;Uncooperative;Doesn't follow directions;Decreased sustained attention;Distractible;Requires cueing Oral Cavity - Dentition: Edentulous Patient Positioning: Upright in bed  Oral Cavity - Oral Hygiene Does patient have any of the following "at risk" factors?: Other - dysphagia Brush patient's teeth BID with toothbrush (using toothpaste with fluoride): Yes Patient is HIGH RISK - Oral Care Protocol followed (see row info): Yes   Dysphagia Treatment Treatment focused on: Skilled observation of diet  tolerance Treatment Methods/Modalities: Skilled observation Patient observed directly with PO's: No Reason PO's not observed: Refused;Lethargic Feeding: Total assist Liquids provided via: Teaspoon Type of cueing: Verbal;Tactile Amount of cueing: Maximal   GO     Maryjo Rochester T 08/08/2013, 4:01 PM

## 2013-08-08 NOTE — Progress Notes (Signed)
Stroke Team Progress Note  HISTORY  Francisco Johnston is an 79 y.o. male, right handed, with a past medical history significant for HTN, CAD s/p MI few years ago, bilateral lacunar subcortical infarcts, colon cancer s/p colectomy, transferred to [facility] for further evaluation and management of left frontal ICH.  Patient is not able to provide information about his current situation, but family is at bedside and stated that around 11:30 pm last night Francisco Johnston was watching TV and suddenly developed difficulty talking, slurred speech, right face weakness, and right sided weakness. His family took him to [facility] ED where he was found o have " dysarthric speech, mild right facial droop, and dense right hemiparesis arm 1/5 and leg 3/5. Right toe up going and BP 186/139".   An emergent CT brain reveled a 3 cm posterior left frontal intraparenchymal hematoma without extension into the ventricles, mass effect, or hydrocephalus. No recent fall, head trauma, or use of anticoagulants. Upon evaluation, he is awake, follows simple commands, and denies HA, vertigo, double vision, chest pain, shortness of breath, or palpitations.  SUBJECTIVE Multiple family members at the bedside. Per RN Marissa, patient with 45 sec right face/arm seizure this am around 835, followed by another one around 855a. Resolved after treatment with ativan, 1 dose depacon. Currently, pt with ongoing UE and facial twitching and forced right eye gaze deviation which subsided with 1mg   iv ativan in my presence.  OBJECTIVE Most recent Vital Signs: Filed Vitals:   08/08/13 0840 08/08/13 0915 08/08/13 0925 08/08/13 1030  BP: 156/62 168/83  145/87  Pulse: 54 48  57  Temp: 98 F (36.7 C)   101.2 F (38.4 C)  TempSrc: Axillary   Axillary  Resp: 20 16  20   Height:      Weight:      SpO2: 97% 88% 100% 100%   CBG (last 3)   Recent Labs  08/07/13  GLUCAP 113*    IV Fluid Intake:   . sodium chloride 75 mL/hr at 08/07/13 1715     MEDICATIONS  . amLODipine  5 mg Oral Daily  . antiseptic oral rinse  15 mL Mouth Rinse BID  . pantoprazole (PROTONIX) IV  40 mg Intravenous QHS  . senna-docusate  1 tablet Oral BID  . valproate sodium  500 mg Intravenous Q8H   PRN:  acetaminophen, acetaminophen, hydrALAZINE  Diet:  Dysphagia 1 nectar thick Activity:  Bedrest  DVT Prophylaxis:  SCD  CLINICALLY SIGNIFICANT STUDIES Basic Metabolic Panel:   Recent Labs Lab 08/07/13 1454 08/08/13 0545  NA 139 139  K 3.3* 3.0*  CL 101 102  CO2 25 29  GLUCOSE 133* 118*  BUN 5* 4*  CREATININE 0.61 0.69  CALCIUM 9.0 9.2  MG 1.5  --    Liver Function Tests:   Recent Labs Lab 08/07/13 0001  AST 17  ALT 8  ALKPHOS 99  BILITOT 0.6  PROT 8.5*  ALBUMIN 4.1   CBC:   Recent Labs Lab 08/07/13 0001  WBC 5.3  NEUTROABS 2.7  HGB 15.4  HCT 45.5  MCV 86.7  PLT 165   Coagulation:   Recent Labs Lab 08/07/13 0001  LABPROT 13.5  INR 1.05   Cardiac Enzymes:   Recent Labs Lab 08/07/13 0001  TROPONINI <0.30   Urinalysis:   Recent Labs Lab 08/07/13 0008  COLORURINE YELLOW  LABSPEC 1.015  PHURINE 7.0  GLUCOSEU NEGATIVE  HGBUR TRACE*  BILIRUBINUR NEGATIVE  KETONESUR NEGATIVE  PROTEINUR 100*  UROBILINOGEN 0.2  NITRITE NEGATIVE  LEUKOCYTESUR NEGATIVE   Lipid Panel    Component Value Date/Time   CHOL  Value: 55        ATP III CLASSIFICATION:  <200     mg/dL   Desirable  161-096  mg/dL   Borderline High  >=045    mg/dL   High        4/0/9811 0603   TRIG 91 07/07/2010 0603   HDL 32* 07/07/2010 0603   CHOLHDL 1.7 07/07/2010 0603   VLDL 18 07/07/2010 0603   LDLCALC  Value: 5        Total Cholesterol/HDL:CHD Risk Coronary Heart Disease Risk Table                     Men   Women  1/2 Average Risk   3.4   3.3  Average Risk       5.0   4.4  2 X Average Risk   9.6   7.1  3 X Average Risk  23.4   11.0        Use the calculated Patient Ratio above and the CHD Risk Table to determine the patient's CHD Risk.        ATP III  CLASSIFICATION (LDL):  <100     mg/dL   Optimal  914-782  mg/dL   Near or Above                    Optimal  130-159  mg/dL   Borderline  956-213  mg/dL   High  >086     mg/dL   Very High 04/12/8468 6295   HgbA1C  Lab Results  Component Value Date   HGBA1C 5.5 04/05/2013    Urine Drug Screen:     Component Value Date/Time   LABOPIA NONE DETECTED 08/07/2013 0008   COCAINSCRNUR NONE DETECTED 08/07/2013 0008   LABBENZ NONE DETECTED 08/07/2013 0008   AMPHETMU NONE DETECTED 08/07/2013 0008   THCU NONE DETECTED 08/07/2013 0008   LABBARB NONE DETECTED 08/07/2013 0008    Alcohol Level:   Recent Labs Lab 08/07/13 0001  ETH <11    CT Head  08/07/2013   1.Three cm posterior left frontal intraparenchymal hematoma.  2. Brain atrophy and extensive chronic small vessel ischemic injury.     CT angio head  08/08/2013 1. Mild distal small vessel disease.  2. No significant vascular lesion associated with the area of the  posterior left frontal hemorrhage.  3. Expected evolution of the hemorrhage without significant interval change in size.  4. Atrophy and extensive white matter disease is advanced for age.  CXR    EKG  NSR   EEG  Therapy Recommendations   Physical Exam   Gen: NAD Head: normocephalic.  Neck: supple, no bruits  Cardiac: no murmurs.  Lungs: clear, coarse bilateral breath sounds Abdomen: soft, no tender, no mass.  Extremities: bilateral LE pitting edema.   Mental Status:  Alert, awake, oriented to place and year. Dysarthric. Follows simple commands, neglects commands on R side Cranial Nerves:  PERRL, forced right eye deviation with twitchings right lower face and bilateral UE rt more than left, no ptosis, R facial droop, hearing intact bilat to conversation, cough present, tongue midline  Motor:  Moves Left side spontaneously and against light resistance Dense right HP  Tone: diminished in the right side.  Sensory: LT intact left side, withdraws RUE weakly and RLE briskly to noxious  stimuli Deep Tendon Reflexes:  1  all over  Plantars:  Right: upgoing Left: downgoing  Gait: bed rest  ASSESSMENT Francisco Johnston is a 79 y.o. male presenting with right hemiparesis, right facial droop, dysarthria.. Imaging confirms a left frontal ICH. Hemorrhage felt to be secondary to malignant Hypertension, with BP 186/139 on arrival. Now with new onset seizure; seizure secondary to hemorrhage. Repeat CT with hemorrhage unchanged.  On aspirin 81 mg orally every day prior to admission. Now on no antithrombotics for secondary stroke prevention due to hemorrhage. Patient with resultant right hemiparesis, dysarthria, seizure.    Malignant hypertension, BP 186/139 on arrival, remains elevated, on Norvasc & triamterene/hctz prior to admission, now on Norvasc, diuretic not reordered due to hypokalemia. BP remains elevated, though at goal < 180  New onset seizure secondary to hemorrhage. BUE and face involved - 2 episodes, 45 sec & 1 min. Treated with ativan, Depacon load and scheduled dose.  Colon cancer  Hx Non compliance  Hypokalemia, 3.3-> 3.0  Febrile, ? Etiology. UA unremarkable  Hospital day # 2  TREATMENT/PLAN  Give additional depacon 1 gm IV, given ativan 1 mg IV now. Check valproic acid 2 hr after 2nd load administered. Marissa notified. EEG pending. Monitor for additional seizures. Low threshold for transfer to unit  Monitor BP. Goal SBP < 180  Replete potassium - 4 runs, recheck in am  CXR. Check CBC in am  Annie Main, MSN, RN, ANVP-BC, ANP-BC, GNP-BC [facility] Pager: [phone] 08/08/2013 12:14 PM This patient is critically ill and at significant risk of neurological worsening, death and care requires constant monitoring of vital signs, hemodynamics,respiratory and cardiac monitoring,review of multiple databases, neurological assessment, discussion with family, other specialists and medical decision making of high complexity. I spent 30 minutes of  neurocritical care time  in the care of  this patient. I have personally obtained a history, examined the patient, evaluated imaging results, and formulated the assessment and plan of care. I agree with the above. Delia Heady, MD

## 2013-08-08 NOTE — Significant Event (Signed)
Rapid Response Event Note  Overview: Time Called: 0855 Arrival Time: 0900 Event Type: Neurologic  Initial Focused Assessment: Patient with seizure x 2 per RN. Patient unresponsive to stimulation.  BP 168/83  HR 40-50s, RR 16  O2 sat 88% on RA Lung sounds clear upper, diminished through out.  Regular heart tones Noted  "twitching" of Left arm and leg and Left arm contracted, lasted about 30-45 sec.   Interventions: Placed patient on 2L [place]  O2 sats 100% RN spoke with MD orders received. RN to call if assistance needed.  Event Summary: Name of Physician Notified: Dr Pearlean Brownie at 0830    at    Outcome: Stayed in room and stabalized  Event End Time: 1610  Marcellina Millin

## 2013-08-08 NOTE — Progress Notes (Signed)
PT Cancellation Note  Patient Details Name: DAXEN LANUM MRN: [id number] DOB: [birth year]   Cancelled Treatment:    Reason Eval/Treat Not Completed: Medical issues which prohibited therapy. Pt currently having a seizure. Will hold and f/u tomorrow for medical stability.    Ludger Nutting 08/08/2013, 12:21 PM  Linna Hoff PT, DPT Pager: [phone]

## 2013-08-09 LAB — URINE MICROSCOPIC-ADD ON

## 2013-08-09 LAB — COMPREHENSIVE METABOLIC PANEL
ALT: 7 U/L (ref 0–53)
Alkaline Phosphatase: 66 U/L (ref 39–117)
BUN: 11 mg/dL (ref 6–23)
CO2: 28 mEq/L (ref 19–32)
Chloride: 104 mEq/L (ref 96–112)
GFR calc Af Amer: >90 mL/min (ref >90)
Glucose, Bld: 95 mg/dL (ref 70–99)
Potassium: 4.4 mEq/L (ref 3.5–5.1)
Sodium: 141 mEq/L (ref 135–145)
Total Bilirubin: 1.1 mg/dL (ref 0.3–1.2)

## 2013-08-09 LAB — URINALYSIS, ROUTINE W REFLEX MICROSCOPIC
Bilirubin Urine: NEGATIVE
Glucose, UA: NEGATIVE mg/dL
Ketones, ur: 15 mg/dL — AB
pH: 6 (ref 5.0–8.0)

## 2013-08-09 LAB — GLUCOSE, CAPILLARY
Glucose-Capillary: 77 mg/dL (ref 70–99)
Glucose-Capillary: 82 mg/dL (ref 70–99)

## 2013-08-09 LAB — CBC
Hemoglobin: 14.7 g/dL (ref 13.0–17.0)
MCH: 29.9 pg (ref 26.0–34.0)
MCV: 86.4 fL (ref 78.0–100.0)
RBC: 4.92 MIL/uL (ref 4.22–5.81)

## 2013-08-09 LAB — BASIC METABOLIC PANEL
CO2: 26 mEq/L (ref 19–32)
Glucose, Bld: 97 mg/dL (ref 70–99)
Potassium: 3.5 mEq/L (ref 3.5–5.1)
Sodium: 144 mEq/L (ref 135–145)

## 2013-08-09 MED ORDER — PANTOPRAZOLE SODIUM 40 MG PO TBEC
40.0000 mg | DELAYED_RELEASE_TABLET | Freq: Every day | ORAL | Status: DC
  Administered 2013-08-09 – 2013-08-14 (×6): 40 mg via ORAL
  Filled 2013-08-09 (×2): qty 1

## 2013-08-09 NOTE — Progress Notes (Signed)
Speech Language Pathology Dysphagia Treatment Patient Details Name: Francisco Johnston MRN: [id number] DOB: [birth year] Today's Date: 08/09/2013 Time: 7829-5621 SLP Time Calculation (min): 15 min  Assessment / Plan / Recommendation Clinical Impression  Pt seen with noon meal. Though awake, pt with decreased responsiveness to cues and feeding. Max oral residuals with solids. No evidence of aspiration with nectar thick liquids, but given arousal, could not be certain if pt could have increased risk of silent aspiration, especially given temp spike and MD concern. Allowed RN to finish meal, but recommend NPO until arousal improves. Will f/u in am tommorrow for further trials.     Diet Recommendation  Initiate / Change Diet: NPO    SLP Plan Goals updated   Pertinent Vitals/Pain NA   Swallowing Goals  SLP Swallowing Goals Goal #3: Pt will follow commands during assisted feeing with 80% accuracy to increase safety with swallowing prior to initiating diet.  Swallow Study Goal #3 - Progress: Progressing toward goal  General Temperature Spikes Noted: No Respiratory Status: Room air Behavior/Cognition: Lethargic;Uncooperative;Doesn't follow directions;Decreased sustained attention;Distractible;Requires cueing Oral Cavity - Dentition: Edentulous Patient Positioning: Upright in bed  Oral Cavity - Oral Hygiene Does patient have any of the following "at risk" factors?: Other - dysphagia Patient is AT RISK - Oral Care Protocol followed (see row info): Yes   Dysphagia Treatment Treatment focused on: Skilled observation of diet tolerance Treatment Methods/Modalities: Skilled observation Patient observed directly with PO's: Yes Type of PO's observed: Dysphagia 1 (puree);Nectar-thick liquids Feeding: Needs assist Liquids provided via: Cup;Teaspoon Oral Phase Signs & Symptoms: Right pocketing;Left pocketing;Anterior loss/spillage;Prolonged oral phase Pharyngeal Phase Signs & Symptoms: Suspected  delayed swallow initiation;Multiple swallows Type of cueing: Verbal;Tactile;Visual Amount of cueing: Maximal   GO    Harlon Ditty, MA CCC-SLP [phone]  Claudine Mouton 08/09/2013, 12:31 PM

## 2013-08-09 NOTE — Evaluation (Signed)
Occupational Therapy Evaluation Patient Details Name: Francisco Johnston MRN: [id number] DOB: [birth year] Today's Date: 08/09/2013 Time: 1001-1017 OT Time Calculation (min): 16 min  OT Assessment / Plan / Recommendation History of present illness pt presents with L Frontal ICH with R sided deficits and Seizure activity.     Clinical Impression   Pt presents with below problem list. Pt lethargic during session and difficult to understand. Unsure PLOF or home set-up. Pt will benefit from acute OT to increase independence prior to d/c to venue listed below.     OT Assessment  Patient needs continued OT Services    Follow Up Recommendations  SNF;Supervision/Assistance - 24 hour    Barriers to Discharge      Equipment Recommendations  Other (comment) (tbd)    Recommendations for Other Services    Frequency  Min 2X/week    Precautions / Restrictions Precautions Precautions: Fall Restrictions Weight Bearing Restrictions: No   Pertinent Vitals/Pain Did not indicate pain.     ADL  Grooming: Performed;Wash/dry face;Maximal assistance;Minimal assistance (Min A- washing face in bed Max A-EOB) Where Assessed - Grooming: Supported sitting;Supine, head of bed up Upper Body Bathing: +1 Total assistance Where Assessed - Upper Body Bathing: Supported sitting Lower Body Bathing: +2 Total assistance Lower Body Bathing: Patient Percentage: 0% Where Assessed - Lower Body Bathing: Rolling right and/or left;Supine, head of bed up;Supine, head of bed flat Upper Body Dressing: +1 Total assistance Where Assessed - Upper Body Dressing: Supported sitting Lower Body Dressing: +2 Total assistance Lower Body Dressing: Patient Percentage: 0% Where Assessed - Lower Body Dressing: Supine, head of bed up;Supine, head of bed flat;Rolling right and/or left Toilet Transfer: Other (comment) (not assessed) Toileting - Clothing Manipulation and Hygiene: +2 Total assistance Toileting - Clothing Manipulation and  Hygiene: Patient Percentage: 0% Where Assessed - Toileting Clothing Manipulation and Hygiene: Supine, head of bed flat;Rolling right and/or left Tub/Shower Transfer Method: Not assessed Equipment Used: Other (comment) (O2) ADL Comments: Pt washed face supine in bed and had difficulty following commands-OT told pt to wash Rt eye and provided hand over hand assistance as pt was not following command (may be attention?). Pt sat EOB and pt was presented with washcloth again and pt required Max A for balance.     OT Diagnosis: Hemiplegia dominant side;Cognitive deficits  OT Problem List: Decreased strength;Decreased activity tolerance;Impaired balance (sitting and/or standing);Decreased cognition;Decreased knowledge of precautions;Decreased knowledge of use of DME or AE;Impaired UE functional use OT Treatment Interventions: Self-care/ADL training;DME and/or AE instruction;Neuromuscular education;Therapeutic activities;Cognitive remediation/compensation;Balance training;Patient/family education;Visual/perceptual remediation/compensation   OT Goals(Current goals can be found in the care plan section) Acute Rehab OT Goals Patient Stated Goal: None stated.   OT Goal Formulation: Patient unable to participate in goal setting Time For Goal Achievement: 08/16/13 Potential to Achieve Goals: Fair ADL Goals Pt Will Perform Grooming: with supervision;with set-up;sitting Pt Will Transfer to Toilet: with min assist;stand pivot transfer;bedside commode Additional ADL Goal #1: Pt will perform bed mobility with Mod A as precursor for EOB ADLs.  Additional ADL Goal #2: Pt will sit EOB and perform functional task with Min A for balance.   Visit Information  Last OT Received On: 08/09/13 Assistance Needed: +2 PT/OT Co-Evaluation/Treatment: Yes History of Present Illness: pt presents with L Frontal ICH with R sided deficits and Seizure activity.         Prior Functioning     Home Living Additional Comments:  Unable to get Home set-up 2/2 pt mumbles unintelligibly.   Communication Communication:  (  difficult to understand) Dominant Hand: Right         Vision/Perception     Cognition  Cognition Arousal/Alertness: Lethargic Behavior During Therapy: Flat affect Overall Cognitive Status: Difficult to assess Difficult to assess due to: Level of arousal    Extremity/Trunk Assessment Upper Extremity Assessment Upper Extremity Assessment: RUE deficits/detail;Difficult to assess due to impaired cognition     Mobility Bed Mobility Bed Mobility: Supine to Sit;Sitting - Scoot to Edge of Bed;Sit to Supine Supine to Sit: 1: +2 Total assist Supine to Sit: Patient Percentage: 10% Sitting - Scoot to Edge of Bed: 1: +2 Total assist Sitting - Scoot to Edge of Bed: Patient Percentage: 0% Sit to Supine: 1: +2 Total assist Sit to Supine: Patient Percentage: 0% Details for Bed Mobility Assistance: cues for sequencing and participation.       Exercise     Balance Balance Balance Assessed: Yes Static Sitting Balance Static Sitting - Balance Support: Bilateral upper extremity supported;Feet supported Static Sitting - Level of Assistance: 4: Min assist Static Sitting - Comment/# of Minutes: Consistent MinA needed to maintain sitting balance.  pt tends to lean posteriorly and to R side.   Dynamic Sitting Balance Dynamic Sitting - Balance Support: Feet supported;Right upper extremity supported;During functional activity Dynamic Sitting - Level of Assistance: 2: Max assist Dynamic Sitting - Comments: Gave pt washcloth to wash face sitting EOB and pt requiring Max A for balance-also asked to touch head and pt touching nose.    End of Session OT - End of Session Equipment Utilized During Treatment: Oxygen Activity Tolerance: Patient limited by lethargy Patient left: in bed;with call bell/phone within reach;with bed alarm set  GO     Earlie Raveling OTR/L [phone] 08/09/2013, 3:47 PM

## 2013-08-09 NOTE — Evaluation (Signed)
Physical Therapy Evaluation Patient Details Name: Francisco Johnston MRN: [id number] DOB: [birth year] Today's Date: 08/09/2013 Time: 0865-7846 PT Time Calculation (min): 24 min  PT Assessment / Plan / Recommendation History of Present Illness  pt presents with L Frontal ICH with R sided deficits and Seizure activity.    Clinical Impression  Pt lethargic and difficult to understand.  Unclear what is pt's PLOF or home set-up.  Will continue to follow.      PT Assessment  Patient needs continued PT services    Follow Up Recommendations  SNF    Does the patient have the potential to tolerate intense rehabilitation      Barriers to Discharge        Equipment Recommendations  Rolling walker with 5" wheels    Recommendations for Other Services     Frequency Min 3X/week    Precautions / Restrictions Precautions Precautions: Fall Restrictions Weight Bearing Restrictions: No   Pertinent Vitals/Pain Did not indicate pain.        Mobility  Bed Mobility Bed Mobility: Supine to Sit;Sitting - Scoot to Edge of Bed;Sit to Supine Supine to Sit: 1: +2 Total assist Supine to Sit: Patient Percentage: 10% Sitting - Scoot to Edge of Bed: 1: +2 Total assist Sitting - Scoot to Edge of Bed: Patient Percentage: 0% Sit to Supine: 1: +2 Total assist Sit to Supine: Patient Percentage: 0% Details for Bed Mobility Assistance: cues for sequencing and participation.   Transfers Transfers: Not assessed Ambulation/Gait Ambulation/Gait Assistance: Not tested (comment) Stairs: No Wheelchair Mobility Wheelchair Mobility: No Modified Rankin (Stroke Patients Only) Pre-Morbid Rankin Score:  (Unclear 2/2 pt mumbles unintelligibly.  ) Modified Rankin: Severe disability    Exercises     PT Diagnosis: Hemiplegia dominant side  PT Problem List: Decreased strength;Decreased activity tolerance;Decreased balance;Decreased mobility;Decreased coordination;Decreased cognition;Decreased knowledge of use of  DME;Decreased safety awareness PT Treatment Interventions: DME instruction;Gait training;Functional mobility training;Therapeutic activities;Therapeutic exercise;Balance training;Neuromuscular re-education;Cognitive remediation;Patient/family education     PT Goals(Current goals can be found in the care plan section) Acute Rehab PT Goals Patient Stated Goal: None stated.   PT Goal Formulation: Patient unable to participate in goal setting Time For Goal Achievement: 08/23/13 Potential to Achieve Goals: Fair  Visit Information  Last PT Received On: 08/09/13 Assistance Needed: +2 PT/OT Co-Evaluation/Treatment: Yes History of Present Illness: pt presents with L Frontal ICH with R sided deficits and Seizure activity.         Prior Functioning  Home Living Additional Comments: Unable to get Home set-up 2/2 pt mumbles unintelligibly.   Communication Communication:  (Difficult to understand) Dominant Hand: Right    Cognition  Cognition Arousal/Alertness: Lethargic Behavior During Therapy: Flat affect Overall Cognitive Status: Difficult to assess Difficult to assess due to: Level of arousal    Extremity/Trunk Assessment Upper Extremity Assessment Upper Extremity Assessment: Defer to OT evaluation Lower Extremity Assessment Lower Extremity Assessment: RLE deficits/detail RLE Deficits / Details: Difficult to formally assess 2/2 lethargy and cognition, however able to A with movment of R LE OOB.  Increased tone noted.   RLE:  (Due to cognition)   Balance Balance Balance Assessed: Yes Static Sitting Balance Static Sitting - Balance Support: Bilateral upper extremity supported;Feet supported Static Sitting - Level of Assistance: 4: Min assist Static Sitting - Comment/# of Minutes: Consistent MinA needed to maintain sitting balance.  pt tends to lean posteriorly and to R side.    End of Session PT - End of Session Equipment Utilized During Treatment: Oxygen Activity  Tolerance: Patient  limited by lethargy Patient left: in bed;with call bell/phone within reach;with bed alarm set Nurse Communication: Mobility status  GP     Sunny Schlein, [place] [phone] 08/09/2013, 1:28 PM

## 2013-08-09 NOTE — Progress Notes (Signed)
Stroke Team Progress Note  HISTORY  Francisco Johnston is an 79 y.o. male, right handed, with a past medical history significant for HTN, CAD s/p MI few years ago, bilateral lacunar subcortical infarcts, colon cancer s/p colectomy, transferred to [facility] for further evaluation and management of left frontal ICH.  Patient is not able to provide information about his current situation, but family is at bedside and stated that around 11:30 pm last night Francisco Johnston was watching TV and suddenly developed difficulty talking, slurred speech, right face weakness, and right sided weakness. His family took him to [facility] ED where he was found o have " dysarthric speech, mild right facial droop, and dense right hemiparesis arm 1/5 and leg 3/5. Right toe up going and BP 186/139".   An emergent CT brain reveled a 3 cm posterior left frontal intraparenchymal hematoma without extension into the ventricles, mass effect, or hydrocephalus. No recent fall, head trauma, or use of anticoagulants. Upon evaluation, he is awake, follows simple commands, and denies HA, vertigo, double vision, chest pain, shortness of breath, or palpitations.  SUBJECTIVE No new neurological symptoms. No further known seizures.alert and interactive  OBJECTIVE Most recent Vital Signs: Filed Vitals:   08/09/13 0224 08/09/13 0612 08/09/13 0955 08/09/13 1355  BP: 151/86 159/92 170/63 147/77  Pulse: 110 71 65 67  Temp: 99.3 F (37.4 C) 98.9 F (37.2 C) 99.7 F (37.6 C) 99 F (37.2 C)  TempSrc: Oral Oral Oral Oral  Resp: 18 18 18 18   Height:      Weight:      SpO2: 100% 100% 100% 100%   CBG (last 3)   Recent Labs  08/07/13  GLUCAP 113*    IV Fluid Intake:   . sodium chloride 75 mL/hr at 08/09/13 0558    MEDICATIONS  . amLODipine  5 mg Oral Daily  . antiseptic oral rinse  15 mL Mouth Rinse BID  . pantoprazole  40 mg Oral Daily  . senna-docusate  1 tablet Oral BID  . valproate sodium  500 mg Intravenous Q8H   PRN:   acetaminophen, acetaminophen, hydrALAZINE  Diet:  NPO -patient too sleepy Activity:  Ambulate with assist DVT Prophylaxis:  SCD  CLINICALLY SIGNIFICANT STUDIES Basic Metabolic Panel:   Recent Labs Lab 08/07/13 1454  08/09/13 0440 08/09/13 1227  NA 139  < > 144 141  K 3.3*  < > 3.5 4.4  CL 101  < > 107 104  CO2 25  < > 26 28  GLUCOSE 133*  < > 97 95  BUN 5*  < > 13 11  CREATININE 0.61  < > 1.06 0.94  CALCIUM 9.0  < > 8.7 9.2  MG 1.5  --   --   --   < > = values in this interval not displayed. Liver Function Tests:   Recent Labs Lab 08/07/13 0001 08/09/13 1227  AST 17 23  ALT 8 7  ALKPHOS 99 66  BILITOT 0.6 1.1  PROT 8.5* 7.3  ALBUMIN 4.1 3.3*   CBC:   Recent Labs Lab 08/07/13 0001 08/09/13 0440  WBC 5.3 8.4  NEUTROABS 2.7  --   HGB 15.4 14.7  HCT 45.5 42.5  MCV 86.7 86.4  PLT 165 140*   Coagulation:   Recent Labs Lab 08/07/13 0001  LABPROT 13.5  INR 1.05   Cardiac Enzymes:   Recent Labs Lab 08/07/13 0001  TROPONINI <0.30   Urinalysis:   Recent Labs Lab 08/07/13 0008 08/09/13 1111  COLORURINE YELLOW YELLOW  LABSPEC 1.015 1.026  PHURINE 7.0 6.0  GLUCOSEU NEGATIVE NEGATIVE  HGBUR TRACE* NEGATIVE  BILIRUBINUR NEGATIVE NEGATIVE  KETONESUR NEGATIVE 15*  PROTEINUR 100* NEGATIVE  UROBILINOGEN 0.2 1.0  NITRITE NEGATIVE NEGATIVE  LEUKOCYTESUR NEGATIVE MODERATE*   Lipid Panel    Component Value Date/Time   CHOL  Value: 55        ATP III CLASSIFICATION:  <200     mg/dL   Desirable  161-096  mg/dL   Borderline High  >=045    mg/dL   High        4/0/9811 0603   TRIG 91 07/07/2010 0603   HDL 32* 07/07/2010 0603   CHOLHDL 1.7 07/07/2010 0603   VLDL 18 07/07/2010 0603   LDLCALC  Value: 5        Total Cholesterol/HDL:CHD Risk Coronary Heart Disease Risk Table                     Men   Women  1/2 Average Risk   3.4   3.3  Average Risk       5.0   4.4  2 X Average Risk   9.6   7.1  3 X Average Risk  23.4   11.0        Use the calculated Patient Ratio  above and the CHD Risk Table to determine the patient's CHD Risk.        ATP III CLASSIFICATION (LDL):  <100     mg/dL   Optimal  914-782  mg/dL   Near or Above                    Optimal  130-159  mg/dL   Borderline  956-213  mg/dL   High  >086     mg/dL   Very High 04/12/8468 6295   HgbA1C  Lab Results  Component Value Date   HGBA1C 5.5 04/05/2013    Urine Drug Screen:     Component Value Date/Time   LABOPIA NONE DETECTED 08/07/2013 0008   COCAINSCRNUR NONE DETECTED 08/07/2013 0008   LABBENZ NONE DETECTED 08/07/2013 0008   AMPHETMU NONE DETECTED 08/07/2013 0008   THCU NONE DETECTED 08/07/2013 0008   LABBARB NONE DETECTED 08/07/2013 0008    Alcohol Level:   Recent Labs Lab 08/07/13 0001  ETH <11    CT Head  08/07/2013   1.Three cm posterior left frontal intraparenchymal hematoma.  2. Brain atrophy and extensive chronic small vessel ischemic injury.     CT angio head  08/08/2013 1. Mild distal small vessel disease.  2. No significant vascular lesion associated with the area of the  posterior left frontal hemorrhage.  3. Expected evolution of the hemorrhage without significant interval change in size.  4. Atrophy and extensive white matter disease is advanced for age.  CXR  Cardiomegaly with vascular congestion. No definite acute process  EKG  NSR   EEGThis is a technically difficult record due to the predominence of muscle and movement artifact. May consider repeat of study if clinically indicated  Therapy Recommendations   Physical Exam   Gen: NAD Head: normocephalic.  Neck: supple, no bruits  Cardiac: no murmurs.  Lungs: clear, coarse bilateral breath sounds Abdomen: soft, no tender, no mass.  Extremities: bilateral LE pitting edema.   Mental Status:  Alert, awake, oriented to place and year. Dysarthric. Follows simple commands, neglects commands on R side Cranial Nerves:  PERRL,  Eye movements are full  range. no ptosis, R facial droop, hearing intact bilat to conversation, cough  present, tongue midline  Motor:  Moves Left side spontaneously and against light resistance Dense right HP  Tone: diminished in the right side.  Sensory: LT intact left side, withdraws RUE weakly and RLE briskly to noxious stimuli Deep Tendon Reflexes:  1 all over  Plantars:  Right: upgoing Left: downgoing  Gait: bed rest  ASSESSMENT Francisco Johnston is a 80 y.o. male presenting with right hemiparesis, right facial droop, dysarthria.. Imaging confirms a left frontal ICH. Hemorrhage felt to be secondary to malignant Hypertension, with BP 186/139 on arrival. Now with new onset seizure; seizure secondary to hemorrhage. Repeat CT with hemorrhage unchanged.  On aspirin 81 mg orally every day prior to admission. Now on no antithrombotics for secondary stroke prevention due to hemorrhage. Patient with resultant right hemiparesis, dysarthria, seizure.    Malignant hypertension, BP 186/139 on arrival, remains elevated, on Norvasc & triamterene/hctz prior to admission, now on Norvasc, diuretic not reordered due to hypokalemia. BP remains elevated, though at goal < 180  New onset seizure secondary to hemorrhage.   Colon cancer  Hx Non compliance  Hypokalemia, resolved  Febrile, ? Etiology. Could be reactive. CXR unremarkable.  Abnormal urine. Await culture.  Hospital day # 3  TREATMENT/PLAN  Depacon 500mg  IV q8  Hours, ativan prn  Recheck CMP (look at liver function)  Gwendolyn Lima. Manson Passey, [facility], MBA, MHA [facility] Pager: [phone] 08/09/2013 4:03 PM   I have personally obtained a history, examined the patient, evaluated imaging results, and formulated the assessment and plan of care. I agree with the above.  Delia Heady, MD

## 2013-08-10 DIAGNOSIS — I6350 Cerebral infarction due to unspecified occlusion or stenosis of unspecified cerebral artery: Secondary | ICD-10-CM

## 2013-08-10 LAB — URINE CULTURE

## 2013-08-10 LAB — GLUCOSE, CAPILLARY: Glucose-Capillary: 77 mg/dL (ref 70–99)

## 2013-08-10 MED ORDER — RESOURCE THICKENUP CLEAR PO POWD
ORAL | Status: DC | PRN
  Filled 2013-08-10: qty 125

## 2013-08-10 MED ORDER — DIVALPROEX SODIUM ER 500 MG PO TB24
1500.0000 mg | ORAL_TABLET | Freq: Every day | ORAL | Status: DC
  Administered 2013-08-10 – 2013-08-14 (×5): 1500 mg via ORAL
  Filled 2013-08-10 (×6): qty 3

## 2013-08-10 MED ORDER — POTASSIUM CHLORIDE CRYS ER 20 MEQ PO TBCR
40.0000 meq | EXTENDED_RELEASE_TABLET | Freq: Every day | ORAL | Status: DC
  Administered 2013-08-10 – 2013-08-15 (×6): 40 meq via ORAL
  Filled 2013-08-10 (×6): qty 2

## 2013-08-10 MED ORDER — CIPROFLOXACIN HCL 250 MG PO TABS
250.0000 mg | ORAL_TABLET | Freq: Two times a day (BID) | ORAL | Status: AC
  Administered 2013-08-10 – 2013-08-12 (×6): 250 mg via ORAL
  Filled 2013-08-10 (×9): qty 1

## 2013-08-10 MED ORDER — DIVALPROEX SODIUM ER 500 MG PO TB24: 500.0000 mg | ORAL_TABLET | Freq: Every day | ORAL | Status: DC

## 2013-08-10 MED ORDER — DIVALPROEX SODIUM ER 500 MG PO TB24: 1500.0000 mg | ORAL_TABLET | Freq: Every day | ORAL | Status: DC

## 2013-08-10 NOTE — Progress Notes (Signed)
Stroke Team Progress Note  HISTORY  Francisco Johnston is an 79 y.o. male, right handed, with a past medical history significant for HTN, CAD s/p MI few years ago, bilateral lacunar subcortical infarcts, colon cancer s/p colectomy, transferred to [facility] for further evaluation and management of left frontal ICH.  Patient is not able to provide information about his current situation, but family is at bedside and stated that around 11:30 pm last night Francisco Johnston was watching TV and suddenly developed difficulty talking, slurred speech, right face weakness, and right sided weakness. His family took him to [facility] ED where he was found o have " dysarthric speech, mild right facial droop, and dense right hemiparesis arm 1/5 and leg 3/5. Right toe up going and BP 186/139".   An emergent CT brain reveled a 3 cm posterior left frontal intraparenchymal hematoma without extension into the ventricles, mass effect, or hydrocephalus. No recent fall, head trauma, or use of anticoagulants. Upon evaluation, he is awake, follows simple commands, and denies HA, vertigo, double vision, chest pain, shortness of breath, or palpitations.  SUBJECTIVE The patient is more alert today, working with speech.  OBJECTIVE Most recent Vital Signs: Filed Vitals:   08/09/13 1958 08/09/13 2130 08/10/13 0605 08/10/13 0933  BP: 154/81 159/81 159/80 179/79  Pulse: 81 58 55 87  Temp: 98.6 F (37 C) 98.7 F (37.1 C) 98.3 F (36.8 C) 98.6 F (37 C)  TempSrc: Oral Oral Oral Oral  Resp: 20 20 16 16   Height:      Weight:      SpO2: 97% 99% 99% 97%   CBG (last 3)   Recent Labs  08/09/13 2346 08/10/13 0356 08/10/13 0735  GLUCAP 77 77 88    IV Fluid Intake:   . sodium chloride 75 mL/hr at 08/09/13 2133    MEDICATIONS  . amLODipine  5 mg Oral Daily  . antiseptic oral rinse  15 mL Mouth Rinse BID  . ciprofloxacin  250 mg Oral BID  . pantoprazole  40 mg Oral Daily  . potassium chloride  40 mEq Oral Daily  .  senna-docusate  1 tablet Oral BID  . valproate sodium  500 mg Intravenous Q8H   PRN:  acetaminophen, acetaminophen, hydrALAZINE, RESOURCE THICKENUP CLEAR  Diet:  Dysphagia -patient too sleepy Activity:  Ambulate with assist DVT Prophylaxis:  SCD  CLINICALLY SIGNIFICANT STUDIES Basic Metabolic Panel:   Recent Labs Lab 08/07/13 1454  08/09/13 0440 08/09/13 1227  NA 139  < > 144 141  K 3.3*  < > 3.5 4.4  CL 101  < > 107 104  CO2 25  < > 26 28  GLUCOSE 133*  < > 97 95  BUN 5*  < > 13 11  CREATININE 0.61  < > 1.06 0.94  CALCIUM 9.0  < > 8.7 9.2  MG 1.5  --   --   --   < > = values in this interval not displayed. Liver Function Tests:   Recent Labs Lab 08/07/13 0001 08/09/13 1227  AST 17 23  ALT 8 7  ALKPHOS 99 66  BILITOT 0.6 1.1  PROT 8.5* 7.3  ALBUMIN 4.1 3.3*   CBC:   Recent Labs Lab 08/07/13 0001 08/09/13 0440  WBC 5.3 8.4  NEUTROABS 2.7  --   HGB 15.4 14.7  HCT 45.5 42.5  MCV 86.7 86.4  PLT 165 140*   Coagulation:   Recent Labs Lab 08/07/13 0001  LABPROT 13.5  INR 1.05  Cardiac Enzymes:   Recent Labs Lab 08/07/13 0001  TROPONINI <0.30   Urinalysis:   Recent Labs Lab 08/07/13 0008 08/09/13 1111  COLORURINE YELLOW YELLOW  LABSPEC 1.015 1.026  PHURINE 7.0 6.0  GLUCOSEU NEGATIVE NEGATIVE  HGBUR TRACE* NEGATIVE  BILIRUBINUR NEGATIVE NEGATIVE  KETONESUR NEGATIVE 15*  PROTEINUR 100* NEGATIVE  UROBILINOGEN 0.2 1.0  NITRITE NEGATIVE NEGATIVE  LEUKOCYTESUR NEGATIVE MODERATE*   Lipid Panel    Component Value Date/Time   CHOL  Value: 55        ATP III CLASSIFICATION:  <200     mg/dL   Desirable  409-811  mg/dL   Borderline High  >=914    mg/dL   High        06/14/2955 0603   TRIG 91 07/07/2010 0603   HDL 32* 07/07/2010 0603   CHOLHDL 1.7 07/07/2010 0603   VLDL 18 07/07/2010 0603   LDLCALC  Value: 5        Total Cholesterol/HDL:CHD Risk Coronary Heart Disease Risk Table                     Men   Women  1/2 Average Risk   3.4   3.3  Average Risk        5.0   4.4  2 X Average Risk   9.6   7.1  3 X Average Risk  23.4   11.0        Use the calculated Patient Ratio above and the CHD Risk Table to determine the patient's CHD Risk.        ATP III CLASSIFICATION (LDL):  <100     mg/dL   Optimal  213-086  mg/dL   Near or Above                    Optimal  130-159  mg/dL   Borderline  578-469  mg/dL   High  >629     mg/dL   Very High 04/07/8412 2440   HgbA1C  Lab Results  Component Value Date   HGBA1C 5.5 04/05/2013    Urine Drug Screen:     Component Value Date/Time   LABOPIA NONE DETECTED 08/07/2013 0008   COCAINSCRNUR NONE DETECTED 08/07/2013 0008   LABBENZ NONE DETECTED 08/07/2013 0008   AMPHETMU NONE DETECTED 08/07/2013 0008   THCU NONE DETECTED 08/07/2013 0008   LABBARB NONE DETECTED 08/07/2013 0008    Alcohol Level:   Recent Labs Lab 08/07/13 0001  ETH <11    CT Head  08/07/2013   1.Three cm posterior left frontal intraparenchymal hematoma.  2. Brain atrophy and extensive chronic small vessel ischemic injury.     CT angio head  08/08/2013 1. Mild distal small vessel disease.  2. No significant vascular lesion associated with the area of the  posterior left frontal hemorrhage.  3. Expected evolution of the hemorrhage without significant interval change in size.  4. Atrophy and extensive white matter disease is advanced for age.  CXR  Cardiomegaly with vascular congestion. No definite acute process  EKG  NSR   EEGThis is a technically difficult record due to the predominence of muscle and movement artifact. May consider repeat of study if clinically indicated  Therapy Recommendations   Physical Exam   Gen: NAD Head: normocephalic.  Neck: supple, no bruits  Cardiac: no murmurs.  Lungs: clear, coarse bilateral breath sounds Abdomen: soft, no tender, no mass.  Extremities: bilateral LE pitting edema.   Mental Status:  Alert, awake, oriented to place and year. Dysarthric. Follows simple commands, neglects commands on R side Cranial  Nerves:  PERRL,  Eye movements are full range. no ptosis, R facial droop, hearing intact bilat to conversation, cough present, tongue midline  Motor:  Moves Left side spontaneously and against light resistance Dense right HP  Tone: diminished in the right side.  Sensory: LT intact left side, withdraws RUE weakly and RLE briskly to noxious stimuli Deep Tendon Reflexes:  1 all over  Plantars:  Right: upgoing Left: downgoing  Gait: bed rest  ASSESSMENT Francisco Johnston is a 79 y.o. male presenting with right hemiparesis, right facial droop, dysarthria.. Imaging confirms a left frontal ICH. Hemorrhage felt to be secondary to malignant Hypertension, with BP 186/139 on arrival. Now with new onset seizure; seizure secondary to hemorrhage. Repeat CT with hemorrhage unchanged.  On aspirin 81 mg orally every day prior to admission. Now on no antithrombotics for secondary stroke prevention due to hemorrhage. Patient with resultant right hemiparesis, dysarthria, seizure.    Malignant hypertension, BP 186/139 on arrival, remains elevated, on Norvasc & triamterene/hctz prior to admission, now on Norvasc, diuretic not reordered due to hypokalemia. BP remains elevated, though at goal < 180  New onset seizure secondary to hemorrhage. No further seizures.  Colon cancer  Hx Non compliance  Hypokalemia, resolved  Urinary tract infection, started cipro  Hospital day # 4  TREATMENT/PLAN  Depacon 500mg  IV q8  Hours-change to oral dose  Cipro started for UTI.  BMET in am to check potassium/renal function.  SNF recommended, case management alerted.  Speech passed to a DYS 1 Nectar thick diet.  Gwendolyn Lima. Manson Passey, [facility], MBA, MHA [facility] Pager: [phone] 08/10/2013 11:40 AM   I have personally obtained a history, examined the patient, evaluated imaging results, and formulated the assessment and plan of care. I agree with the above.  Delia Heady, MD

## 2013-08-10 NOTE — Progress Notes (Signed)
Speech Language Pathology Dysphagia Treatment Patient Details Name: Francisco Johnston MRN: [id number] DOB: 06/20/1934 Today's Date: 08/10/2013 Time: 4098-1191 SLP Time Calculation (min): 28 min  Assessment / Plan / Recommendation Clinical Impression  Pt much more responsive today intitially, responding to cues for safe swallowing as able and participating in dysarthria therapy. Pt consumed 100% of yogurt and nectar thick liquids with evidence of delayed swallow, multiple swallows and probable pharyngeal residuals, but no signs of aspiration. Recommend initiate dys 1 (puree) diet with nectar thick liquids with full supervision with POs and cues for small sips and multiple swallows.    SLP provided max verbal and visual cues for approximation of phonemes in CV and CVCV words. Pt particularly struggles with velar stops /g/ /k/, likely due to base of tongue weakness. As attempts progressed pt became more distracted and visibly fatigued, unable to approximate any sounds. Also provided max cues for pt to use gestures to communicate basic wants and needs, pt will need continued instruction with this    Diet Recommendation  Initiate / Change Diet: Dysphagia 1 (puree);Nectar-thick liquid    SLP Plan Goals updated   Pertinent Vitals/Pain NA   Swallowing Goals  SLP Swallowing Goals Patient will consume recommended diet without observed clinical signs of aspiration with: Maximum assistance Patient will utilize recommended strategies during swallow to increase swallowing safety with: Maximum assistance Swallow Study Goal #2 - Progress: Progressing toward goal Goal #3: Pt will follow commands during assisted feeing with 80% accuracy to increase safety with swallowing prior to initiating diet.  Swallow Study Goal #3 - Progress: Met  General Temperature Spikes Noted: No Respiratory Status: Room air Behavior/Cognition: Alert;Cooperative;Pleasant mood;Requires cueing;Decreased sustained attention Oral  Cavity - Dentition: Edentulous Patient Positioning: Upright in bed  Oral Cavity - Oral Hygiene Does patient have any of the following "at risk" factors?: Other - dysphagia Brush patient's teeth BID with toothbrush (using toothpaste with fluoride): Yes Patient is AT RISK - Oral Care Protocol followed (see row info): Yes   Dysphagia Treatment Treatment focused on: Upgraded PO texture trials;Patient/family/caregiver education;Utilization of compensatory strategies Treatment Methods/Modalities: Skilled observation;Differential diagnosis Patient observed directly with PO's: Yes Type of PO's observed: Dysphagia 1 (puree);Nectar-thick liquids Feeding: Needs assist;Able to feed self Liquids provided via: Cup;Teaspoon Oral Phase Signs & Symptoms: Prolonged oral phase Pharyngeal Phase Signs & Symptoms: Suspected delayed swallow initiation;Multiple swallows Type of cueing: Verbal;Visual Amount of cueing: Moderate   GO     Jarvis Sawa, Riley Nearing 08/10/2013, 9:22 AM

## 2013-08-11 LAB — BASIC METABOLIC PANEL
CO2: 30 mEq/L (ref 19–32)
Calcium: 8.9 mg/dL (ref 8.4–10.5)
Creatinine, Ser: 0.76 mg/dL (ref 0.50–1.35)
GFR calc non Af Amer: 86 mL/min — ABNORMAL LOW (ref >90)
Glucose, Bld: 83 mg/dL (ref 70–99)
Sodium: 141 mEq/L (ref 135–145)

## 2013-08-11 MED ORDER — LABETALOL HCL 5 MG/ML IV SOLN
10.0000 mg | Freq: Once | INTRAVENOUS | Status: AC
  Administered 2013-08-11: 10 mg via INTRAVENOUS

## 2013-08-11 MED ORDER — LABETALOL HCL 5 MG/ML IV SOLN
INTRAVENOUS | Status: AC
  Filled 2013-08-11: qty 4

## 2013-08-11 MED ORDER — POTASSIUM CHLORIDE CRYS ER 20 MEQ PO TBCR
20.0000 meq | EXTENDED_RELEASE_TABLET | Freq: Once | ORAL | Status: AC
  Administered 2013-08-11: 20 meq via ORAL
  Filled 2013-08-11: qty 1

## 2013-08-11 NOTE — Progress Notes (Signed)
Stroke Team Progress Note  HISTORY  Francisco Johnston is an 79 y.o. male, right handed, with a past medical history significant for HTN, CAD s/p MI few years ago, bilateral lacunar subcortical infarcts, colon cancer s/p colectomy, transferred to [facility] for further evaluation and management of left frontal ICH.  Patient is not able to provide information about his current situation, but family is at bedside and stated that around 11:30 pm last night Francisco Johnston was watching TV and suddenly developed difficulty talking, slurred speech, right face weakness, and right sided weakness. His family took him to [facility] ED where he was found o have " dysarthric speech, mild right facial droop, and dense right hemiparesis arm 1/5 and leg 3/5. Right toe up going and BP 186/139".   An emergent CT brain reveled a 3 cm posterior left frontal intraparenchymal hematoma without extension into the ventricles, mass effect, or hydrocephalus. No recent fall, head trauma, or use of anticoagulants. Upon evaluation, he is awake, follows simple commands, and denies HA, vertigo, double vision, chest pain, shortness of breath, or palpitations.  SUBJECTIVE The patient is more alert today, working with speech.No seizures or fever. Await snf bed.  OBJECTIVE Most recent Vital Signs: Filed Vitals:   08/10/13 2112 08/10/13 2235 08/11/13 0243 08/11/13 0553  BP: 164/101 156/92 161/87 163/75  Pulse: 95  85 73  Temp: 99.8 F (37.7 C)  98.1 F (36.7 C) 98.2 F (36.8 C)  TempSrc: Oral  Oral Oral  Resp: 18  18 20   Height:      Weight:      SpO2: 98%  94% 93%   CBG (last 3)   Recent Labs  08/09/13 2346 08/10/13 0356 08/10/13 0735  GLUCAP 77 77 88    IV Fluid Intake:   . sodium chloride 75 mL/hr at 08/11/13 0722    MEDICATIONS  . amLODipine  5 mg Oral Daily  . antiseptic oral rinse  15 mL Mouth Rinse BID  . ciprofloxacin  250 mg Oral BID  . divalproex  1,500 mg Oral Daily  . pantoprazole  40 mg Oral Daily  .  potassium chloride  40 mEq Oral Daily  . senna-docusate  1 tablet Oral BID   PRN:  acetaminophen, acetaminophen, hydrALAZINE, RESOURCE THICKENUP CLEAR  Diet:  Dysphagia 1 (patient more alert, so can start diet) Activity:  Ambulate with assist DVT Prophylaxis:  SCD  CLINICALLY SIGNIFICANT STUDIES Basic Metabolic Panel:   Recent Labs Lab 08/07/13 1454  08/09/13 1227 08/11/13 0550  NA 139  < > 141 141  K 3.3*  < > 4.4 3.3*  CL 101  < > 104 104  CO2 25  < > 28 30  GLUCOSE 133*  < > 95 83  BUN 5*  < > 11 10  CREATININE 0.61  < > 0.94 0.76  CALCIUM 9.0  < > 9.2 8.9  MG 1.5  --   --   --   < > = values in this interval not displayed. Liver Function Tests:   Recent Labs Lab 08/07/13 0001 08/09/13 1227  AST 17 23  ALT 8 7  ALKPHOS 99 66  BILITOT 0.6 1.1  PROT 8.5* 7.3  ALBUMIN 4.1 3.3*   CBC:   Recent Labs Lab 08/07/13 0001 08/09/13 0440  WBC 5.3 8.4  NEUTROABS 2.7  --   HGB 15.4 14.7  HCT 45.5 42.5  MCV 86.7 86.4  PLT 165 140*   Coagulation:   Recent Labs Lab 08/07/13 0001  LABPROT 13.5  INR 1.05   Cardiac Enzymes:   Recent Labs Lab 08/07/13 0001  TROPONINI <0.30   Urinalysis:   Recent Labs Lab 08/07/13 0008 08/09/13 1111  COLORURINE YELLOW YELLOW  LABSPEC 1.015 1.026  PHURINE 7.0 6.0  GLUCOSEU NEGATIVE NEGATIVE  HGBUR TRACE* NEGATIVE  BILIRUBINUR NEGATIVE NEGATIVE  KETONESUR NEGATIVE 15*  PROTEINUR 100* NEGATIVE  UROBILINOGEN 0.2 1.0  NITRITE NEGATIVE NEGATIVE  LEUKOCYTESUR NEGATIVE MODERATE*   Lipid Panel    Component Value Date/Time   CHOL  Value: 55        ATP III CLASSIFICATION:  <200     mg/dL   Desirable  213-086  mg/dL   Borderline High  >=578    mg/dL   High        03/12/9628 0603   TRIG 91 07/07/2010 0603   HDL 32* 07/07/2010 0603   CHOLHDL 1.7 07/07/2010 0603   VLDL 18 07/07/2010 0603   LDLCALC  Value: 5        Total Cholesterol/HDL:CHD Risk Coronary Heart Disease Risk Table                     Men   Women  1/2 Average Risk   3.4    3.3  Average Risk       5.0   4.4  2 X Average Risk   9.6   7.1  3 X Average Risk  23.4   11.0        Use the calculated Patient Ratio above and the CHD Risk Table to determine the patient's CHD Risk.        ATP III CLASSIFICATION (LDL):  <100     mg/dL   Optimal  528-413  mg/dL   Near or Above                    Optimal  130-159  mg/dL   Borderline  244-010  mg/dL   High  >272     mg/dL   Very High 04/09/6643 0347   HgbA1C  Lab Results  Component Value Date   HGBA1C 5.5 04/05/2013    Urine Drug Screen:     Component Value Date/Time   LABOPIA NONE DETECTED 08/07/2013 0008   COCAINSCRNUR NONE DETECTED 08/07/2013 0008   LABBENZ NONE DETECTED 08/07/2013 0008   AMPHETMU NONE DETECTED 08/07/2013 0008   THCU NONE DETECTED 08/07/2013 0008   LABBARB NONE DETECTED 08/07/2013 0008    Alcohol Level:   Recent Labs Lab 08/07/13 0001  ETH <11    CT Head  08/07/2013   1.Three cm posterior left frontal intraparenchymal hematoma.  2. Brain atrophy and extensive chronic small vessel ischemic injury.     CT angio head  08/08/2013 1. Mild distal small vessel disease.  2. No significant vascular lesion associated with the area of the  posterior left frontal hemorrhage.  3. Expected evolution of the hemorrhage without significant interval change in size.  4. Atrophy and extensive white matter disease is advanced for age.  CXR  Cardiomegaly with vascular congestion. No definite acute process  EKG  NSR   EEGThis is a technically difficult record due to the predominence of muscle and movement artifact. May consider repeat of study if clinically indicated  Therapy Recommendations   Physical Exam   Gen: NAD Head: normocephalic.  Neck: supple, no bruits  Cardiac: no murmurs.  Lungs: clear, coarse bilateral breath sounds Abdomen: soft, no tender, no mass.  Extremities: bilateral LE  pitting edema.   Mental Status:  Alert, awake, oriented to place and year. Dysarthric. Follows simple commands, neglects commands on R  side Cranial Nerves:  PERRL,  Eye movements are full range. no ptosis, R facial droop, hearing intact bilat to conversation, cough present, tongue midline  Motor:  Moves Left side spontaneously and against light resistance Dense right HP  Tone: diminished in the right side.  Sensory: LT intact left side, withdraws RUE weakly and RLE briskly to noxious stimuli Deep Tendon Reflexes:  1 all over  Plantars:  Right: upgoing Left: downgoing  Gait: bed rest  ASSESSMENT Francisco Johnston is a 79 y.o. male presenting with right hemiparesis, right facial droop, dysarthria.. Imaging confirms a left frontal ICH. Hemorrhage felt to be secondary to malignant Hypertension, with BP 186/139 on arrival. Now with new onset seizure; seizure secondary to hemorrhage. Repeat CT with hemorrhage unchanged.  On aspirin 81 mg orally every day prior to admission. Now on no antithrombotics for secondary stroke prevention due to hemorrhage. Patient with resultant right hemiparesis, dysarthria, seizure.    Malignant hypertension, BP 186/139 on arrival, remains elevated, on Norvasc & triamterene/hctz prior to admission, now on Norvasc, diuretic not reordered due to hypokalemia. BP remains elevated, though at goal < 180  New onset seizure secondary to hemorrhage. No further seizures.  Colon cancer  Hx Non compliance  Hypokalemia, replacing, following labwork  Urinary tract infection, on cipro  Hospital day # 5  TREATMENT/PLAN  Depacon changed to oral dosing.  Cipro started for UTI.  Replace potassium, check Mg+, repeat bmet in am  SNF recommended, looking for facility  Patient more alert and is tolerating DYS 1 diet at this time  Gwendolyn Lima. Manson Passey, [facility], MBA, MHA [facility] Pager: [phone] 08/11/2013 10:13 AM   I have personally obtained a history, examined the patient, evaluated imaging results, and formulated the assessment and plan of care. I agree with the above.   Delia Heady,  MD

## 2013-08-11 NOTE — Clinical Social Work Note (Signed)
CSW completed assessment. Detailed assessment to follow.   Darlyn Chamber, MSW, LCSWA Clinical Social Work [phone]

## 2013-08-11 NOTE — Progress Notes (Signed)
Physical Therapy Treatment Patient Details Name: Francisco Johnston MRN: [id number] DOB: 1934-11-01 Today's Date: 08/11/2013 Time: 1420-1450 PT Time Calculation (min): 30 min  PT Assessment / Plan / Recommendation  History of Present Illness pt presents with L Frontal ICH with R sided deficits and Seizure activity.     PT Comments   Pt more alert and participatory today. Following commands ~75% of the time (easily distracted and ?loses what you've asked him to do). Keeps head rotated to his Lt throughout session. Responding well to tactile and verbal cues to improve his midline orientation.    Follow Up Recommendations  SNF     Does the patient have the potential to tolerate intense rehabilitation     Barriers to Discharge        Equipment Recommendations       Recommendations for Other Services    Frequency Min 3X/week   Progress towards PT Goals Progress towards PT goals: Progressing toward goals  Plan Current plan remains appropriate    Precautions / Restrictions Precautions Precautions: Fall   Pertinent Vitals/Pain No signs of pain    Mobility  Bed Mobility Bed Mobility: Rolling Right;Rolling Left;Right Sidelying to Sit;Sitting - Scoot to Edge of Bed Rolling Right: 4: Min assist;With rail Rolling Left: 2: Max assist;With rail Right Sidelying to Sit: 2: Max assist;HOB flat;With rails Sitting - Scoot to Edge of Bed: 2: Max assist Details for Bed Mobility Assistance: vc for technique; pt required assist due to Rt sided weakness Transfers Transfers: Sit to Stand;Stand to Sit;Squat Pivot Transfers Sit to Stand: 1: +2 Total assist;With upper extremity assist Sit to Stand: Patient Percentage: 50% Stand to Sit: 3: Mod assist Squat Pivot Transfers: 1: +2 Total assist;With upper extremity assistance Squat Pivot Transfers: Patient Percentage: 60% Details for Transfer Assistance: squat-pivot to his Lt; pt reaching with LUE across recliner to far armrest without cues;  assist/support to Rt knee to prevent buckling; stood x 2 from recliner--assist to weight-shift forward over BOS and to extend hips, knees, spine Ambulation/Gait Ambulation/Gait Assistance: Not tested (comment)    Exercises     PT Diagnosis:    PT Problem List:   PT Treatment Interventions:     PT Goals (current goals can now be found in the care plan section)    Visit Information  Last PT Received On: 08/11/13 Assistance Needed: +2 History of Present Illness: pt presents with L Frontal ICH with R sided deficits and Seizure activity.      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 Sitting Balance Static Sitting - Balance Support: Left upper extremity supported;Feet supported Static Sitting - Level of Assistance: 3: Mod assist Static Sitting - Comment/# of Minutes: leans to Rt and posterior; able to assist with correcting, yet easily loses focus and balance Static Standing Balance Static Standing - Balance Support: No upper extremity supported Static Standing - Level of Assistance: 1: +2 Total assist Static Standing - Comment/# of Minutes: leans to his Rt with head turned to his Lt; hips flexed  End of Session PT - End of Session Equipment Utilized During Treatment: Gait belt Activity Tolerance: Patient tolerated treatment well Patient left: in chair;with call bell/phone within reach;with chair alarm set Nurse Communication: Mobility status   GP     Francisco Johnston 08/11/2013,  3:14 PM Pager [phone]

## 2013-08-11 NOTE — Progress Notes (Signed)
Speech Language Pathology Dysphagia Treatment Patient Details Name: KWESI SANGHA MRN: [id number] DOB: September 21, 1934 Today's Date: 08/11/2013 Time: 1015-1040 SLP Time Calculation (min): 25 min  Assessment / Plan / Recommendation Clinical Impression  Patient seen to address dysphagia goals with direct observation of his oral intake of puree solids and nectar thick liquid PO's. Patient required setup assistance and cues to initiate cup/spoon grasping, however was able to deliver bolus to mouth with minimal tactile cues. Patient exhibited delayed oral phase, and suspected swallow initiation delay, however no overt s/s aspiration and no oral residuals noted with cued alternating sips with bites of puree solids. Patient and nieces provided with verbal education regarding diet consistency recommendations, and importance of oral care/hygiene , and checking for/clearing of oral residuals after PO intake.    Diet Recommendation  Continue with Current Diet: Dysphagia 1 (puree) Initiate / Change Diet: Nectar-thick liquid    SLP Plan Continue with current plan of care   Pertinent Vitals/Pain    Swallowing Goals     General Respiratory Status: Room air Behavior/Cognition: Alert;Cooperative;Pleasant mood;Requires cueing;Decreased sustained attention Oral Cavity - Dentition: Edentulous Patient Positioning: Upright in bed  Oral Cavity - Oral Hygiene     Dysphagia Treatment Treatment focused on: Skilled observation of diet tolerance Treatment Methods/Modalities: Skilled observation Patient observed directly with PO's: Yes Type of PO's observed: Dysphagia 1 (puree);Nectar-thick liquids Feeding: Needs assist Liquids provided via: Cup Oral Phase Signs & Symptoms: Prolonged oral phase Pharyngeal Phase Signs & Symptoms: Suspected delayed swallow initiation Type of cueing: Verbal;Tactile Amount of cueing: Moderate   GO     Pablo Lawrence 08/11/2013, 3:00 PM  Angela Nevin, MA,  CCC-SLP [facility] Speech-Language Pathologist

## 2013-08-11 NOTE — Progress Notes (Signed)
Late Entry:  Around 50 RN called into pt's room regarding pt's elevated HR 130s sustaining. Dr. Roseanne Reno paged and pt placed on temporary telemetry monitor for heart rate and rhythm. Labetalol 10 mg IV ordered to be given once. Pt sustaining in 160s-170s when placed on heart monitor and IV Labetalol was given. HR A.Fib and runs between 110s-130s. Dr. Roseanne Reno made aware. New order for cardiac monitoring placed. Telemetry monitor technician made aware. Will continue to monitor pt. Shanda Bumps [facility] 08/08/13 2014

## 2013-08-12 LAB — BASIC METABOLIC PANEL
CO2: 27 mEq/L (ref 19–32)
Calcium: 8.8 mg/dL (ref 8.4–10.5)
Creatinine, Ser: 0.77 mg/dL (ref 0.50–1.35)
GFR calc non Af Amer: 86 mL/min — ABNORMAL LOW (ref >90)
Glucose, Bld: 91 mg/dL (ref 70–99)
Sodium: 140 mEq/L (ref 135–145)

## 2013-08-12 MED ORDER — ASPIRIN EC 81 MG PO TBEC
81.0000 mg | DELAYED_RELEASE_TABLET | Freq: Every day | ORAL | Status: DC
  Administered 2013-08-12 – 2013-08-15 (×4): 81 mg via ORAL
  Filled 2013-08-12 (×4): qty 1

## 2013-08-12 MED ORDER — METOPROLOL TARTRATE 25 MG PO TABS
25.0000 mg | ORAL_TABLET | Freq: Two times a day (BID) | ORAL | Status: DC
  Administered 2013-08-12 – 2013-08-15 (×7): 25 mg via ORAL
  Filled 2013-08-12 (×8): qty 1

## 2013-08-12 NOTE — Progress Notes (Signed)
Stroke Team Progress Note  HISTORY  Francisco Johnston is a 79 y.o. male, right handed, with a past medical history significant for HTN, CAD s/p MI few years ago, bilateral lacunar subcortical infarcts, colon cancer s/p colectomy, transferred to [facility] for further evaluation and management of left frontal ICH.  Patient was not able to provide information about his situation, but family was at bedside and stated that around 11:30 pm on 08/06/2013  [name] was watching TV and suddenly developed difficulty talking, slurred speech, right face weakness, and right sided weakness. His family took him to [facility] ED where he was found o have " dysarthric speech, mild right facial droop, and dense right hemiparesis arm 1/5 and leg 3/5. Right toe up going and BP 186/139".   An emergent CT brain reveled a 3 cm posterior left frontal intraparenchymal hematoma without extension into the ventricles, mass effect, or hydrocephalus. No recent fall, head trauma, or use of anticoagulants. Upon evaluation, he was awake, followed simple commands, and denied HA, vertigo, double vision, chest pain, shortness of breath, or palpitations.  SUBJECTIVE There no family members present. The patient is without complaints.  OBJECTIVE Most recent Vital Signs: Filed Vitals:   08/11/13 1725 08/11/13 2120 08/12/13 0118 08/12/13 0533  BP: 165/71 166/107 173/94 179/91  Pulse: 89 111 69 65  Temp: 98.5 F (36.9 C) 99 F (37.2 C) 98.9 F (37.2 C) 98.1 F (36.7 C)  TempSrc: Oral Oral Oral Oral  Resp: 18 18 18 18   Height:      Weight:      SpO2: 99% 96% 97% 97%   CBG (last 3)   Recent Labs  08/09/13 2346 08/10/13 0356 08/10/13 0735  GLUCAP 77 77 88    IV Fluid Intake:   . sodium chloride 75 mL/hr at 08/11/13 0722    MEDICATIONS  . amLODipine  5 mg Oral Daily  . antiseptic oral rinse  15 mL Mouth Rinse BID  . ciprofloxacin  250 mg Oral BID  . divalproex  1,500 mg Oral Daily  . pantoprazole  40 mg Oral Daily  .  potassium chloride  40 mEq Oral Daily  . senna-docusate  1 tablet Oral BID   PRN:  acetaminophen, acetaminophen, hydrALAZINE, RESOURCE THICKENUP CLEAR  Diet:  Dysphagia 1 (patient more alert, so can start diet) Activity:  Ambulate with assist DVT Prophylaxis:  SCD  CLINICALLY SIGNIFICANT STUDIES Basic Metabolic Panel:   Recent Labs Lab 08/07/13 1454  08/11/13 0550 08/12/13 0557  NA 139  < > 141 140  K 3.3*  < > 3.3* 3.7  CL 101  < > 104 104  CO2 25  < > 30 27  GLUCOSE 133*  < > 83 91  BUN 5*  < > 10 10  CREATININE 0.61  < > 0.76 0.77  CALCIUM 9.0  < > 8.9 8.8  MG 1.5  --   --  1.6  < > = values in this interval not displayed. Liver Function Tests:   Recent Labs Lab 08/07/13 0001 08/09/13 1227  AST 17 23  ALT 8 7  ALKPHOS 99 66  BILITOT 0.6 1.1  PROT 8.5* 7.3  ALBUMIN 4.1 3.3*   CBC:   Recent Labs Lab 08/07/13 0001 08/09/13 0440  WBC 5.3 8.4  NEUTROABS 2.7  --   HGB 15.4 14.7  HCT 45.5 42.5  MCV 86.7 86.4  PLT 165 140*   Coagulation:   Recent Labs Lab 08/07/13 0001  LABPROT 13.5  INR 1.05   Cardiac Enzymes:   Recent Labs Lab 08/07/13 0001  TROPONINI <0.30   Urinalysis:   Recent Labs Lab 08/07/13 0008 08/09/13 1111  COLORURINE YELLOW YELLOW  LABSPEC 1.015 1.026  PHURINE 7.0 6.0  GLUCOSEU NEGATIVE NEGATIVE  HGBUR TRACE* NEGATIVE  BILIRUBINUR NEGATIVE NEGATIVE  KETONESUR NEGATIVE 15*  PROTEINUR 100* NEGATIVE  UROBILINOGEN 0.2 1.0  NITRITE NEGATIVE NEGATIVE  LEUKOCYTESUR NEGATIVE MODERATE*   Lipid Panel    Component Value Date/Time   CHOL  Value: 55        ATP III CLASSIFICATION:  <200     mg/dL   Desirable  161-096  mg/dL   Borderline High  >=045    mg/dL   High        4/0/9811 0603   TRIG 91 07/07/2010 0603   HDL 32* 07/07/2010 0603   CHOLHDL 1.7 07/07/2010 0603   VLDL 18 07/07/2010 0603   LDLCALC  Value: 5        Total Cholesterol/HDL:CHD Risk Coronary Heart Disease Risk Table                     Men   Women  1/2 Average Risk   3.4    3.3  Average Risk       5.0   4.4  2 X Average Risk   9.6   7.1  3 X Average Risk  23.4   11.0        Use the calculated Patient Ratio above and the CHD Risk Table to determine the patient's CHD Risk.        ATP III CLASSIFICATION (LDL):  <100     mg/dL   Optimal  914-782  mg/dL   Near or Above                    Optimal  130-159  mg/dL   Borderline  956-213  mg/dL   High  >086     mg/dL   Very High 04/12/8468 6295   HgbA1C  Lab Results  Component Value Date   HGBA1C 5.5 04/05/2013    Urine Drug Screen:     Component Value Date/Time   LABOPIA NONE DETECTED 08/07/2013 0008   COCAINSCRNUR NONE DETECTED 08/07/2013 0008   LABBENZ NONE DETECTED 08/07/2013 0008   AMPHETMU NONE DETECTED 08/07/2013 0008   THCU NONE DETECTED 08/07/2013 0008   LABBARB NONE DETECTED 08/07/2013 0008    Alcohol Level:   Recent Labs Lab 08/07/13 0001  ETH <11    CT Head  08/07/2013   1.Three cm posterior left frontal intraparenchymal hematoma.  2. Brain atrophy and extensive chronic small vessel ischemic injury.     CT angio head  08/08/2013 1. Mild distal small vessel disease.  2. No significant vascular lesion associated with the area of the  posterior left frontal hemorrhage.  3. Expected evolution of the hemorrhage without significant interval change in size.  4. Atrophy and extensive white matter disease is advanced for age.  CXR  Cardiomegaly with vascular congestion. No definite acute process  EKG  NSR   EEGThis is a technically difficult record due to the predominence of muscle and movement artifact. May consider repeat of study if clinically indicated  Therapy Recommendations - skilled nursing facility  Physical Exam   Gen: NAD Head: normocephalic.  Neck: supple, no bruits  Cardiac: no murmurs.  Lungs: clear, coarse bilateral breath sounds Abdomen: soft, no tender, no mass.  Extremities: bilateral LE  pitting edema.   Mental Status:  Alert, awake, oriented to place and year. Dysarthric. Follows simple  commands, neglects commands on R side Cranial Nerves:  PERRL,  Eye movements are full range. no ptosis, R facial droop, hearing intact bilat to conversation, cough present, tongue midline  Motor:  Moves Left side spontaneously and against light resistance Dense right HP  Tone: diminished in the right side.  Sensory: LT intact left side, withdraws RUE weakly and RLE briskly to noxious stimuli Deep Tendon Reflexes:  1 all over  Plantars:  Right: upgoing Left: downgoing  Gait: bed rest  ASSESSMENT Mr. LEWIN PELLOW is a 79 y.o. male presenting with right hemiparesis, right facial droop, dysarthria.. Imaging confirms a left frontal ICH. Hemorrhage felt to be secondary to malignant Hypertension, with BP 186/139 on arrival. Now with new onset seizure; seizure secondary to hemorrhage. Repeat CT with hemorrhage unchanged.  On aspirin 81 mg orally every day prior to admission. Now on no antithrombotics for secondary stroke prevention due to hemorrhage. Patient with resultant right hemiparesis, dysarthria, seizure.    Malignant hypertension, BP 186/139 on arrival, remains elevated, on Norvasc & triamterene/hctz prior to admission, now on Norvasc, diuretic not reordered due to hypokalemia. BP remains elevated, though at goal < 180  New onset seizure secondary to hemorrhage. No further seizures.  Colon cancer  Hx Non compliance  Hypokalemia, replacing, following labwork  Urinary tract infection, on cipro  Hospital day # 6  TREATMENT/PLAN  Depacon changed to oral dosing.  Cipro started for UTI.  Chemistries today reveal normal potassium 3.7. Magnesium normal at 1.6.  SNF recommended, looking for facility  Patient more alert and is tolerating DYS 1 diet at this time  New onset of atrial fibrillation with rapid ventricular response last night. Required IV labetalol. Unable to anticoagulate due to recent hemorrhage. Consider metoprolol or Cardizem for rate control. Discussed with Dr.  Pearlean Brownie. Will add low dose metoprolol for rate control and low dose aspirin. The patient is now 5 days out from his bleed.  Delton See PA-C [facility] Pager [phone] 08/12/2013, 9:42 AM   I have personally obtained a history, examined the patient, evaluated imaging results, and formulated the assessment and plan of care. I agree with the above.  Delia Heady, MD

## 2013-08-13 NOTE — Progress Notes (Signed)
Stroke Team Progress Note  HISTORY  Francisco Johnston is a 79 y.o. male, right handed, with a past medical history significant for HTN, CAD s/p MI a few years ago, bilateral lacunar subcortical infarcts, colon cancer s/p colectomy, transferred to [facility] for further evaluation and management of left frontal ICH.  Patient was not able to provide information about his situation, but family was at bedside and stated that around 11:30 pm on 08/06/2013  Francisco Johnston was watching TV and suddenly developed difficulty talking, slurred speech, right facial weakness, and right sided weakness. His family took him to [facility] ED where he was found to have " dysarthric speech, mild right facial droop, and dense right hemiparesis arm 1/5 and leg 3/5. Right toe up going and BP 186/139".   An emergent CT brain reveled a 3 cm posterior left frontal intraparenchymal hematoma without extension into the ventricles, mass effect, or hydrocephalus. No recent fall, head trauma, or use of anticoagulants. Upon evaluation, he was awake, followed simple commands, and denied HA, vertigo, double vision, chest pain, shortness of breath, or palpitations.  SUBJECTIVE No family members present today. The patient has no new complaints.  OBJECTIVE Most recent Vital Signs: Filed Vitals:   08/12/13 1800 08/12/13 2108 08/13/13 0129 08/13/13 0611  BP: 159/99 152/80 151/64 162/92  Pulse: 74 78 55 59  Temp: 98.9 F (37.2 C) 98.8 F (37.1 C) 98.4 F (36.9 C) 98.4 F (36.9 C)  TempSrc: Oral Oral Oral Oral  Resp: 18 18 20 18   Height:      Weight:      SpO2: 98% 98% 97% 97%   CBG (last 3)  No results found for this basename: GLUCAP,  in the last 72 hours  IV Fluid Intake:   . sodium chloride 75 mL/hr at 08/12/13 1959    MEDICATIONS  . amLODipine  5 mg Oral Daily  . antiseptic oral rinse  15 mL Mouth Rinse BID  . aspirin EC  81 mg Oral Daily  . divalproex  1,500 mg Oral Daily  . metoprolol tartrate  25 mg Oral BID  .  pantoprazole  40 mg Oral Daily  . potassium chloride  40 mEq Oral Daily  . senna-docusate  1 tablet Oral BID   PRN:  acetaminophen, acetaminophen, hydrALAZINE, RESOURCE THICKENUP CLEAR  Diet:  Dysphagia 1 (patient more alert, so can start diet) Activity:  Ambulate with assist DVT Prophylaxis:  SCD  CLINICALLY SIGNIFICANT STUDIES Basic Metabolic Panel:   Recent Labs Lab 08/07/13 1454  08/11/13 0550 08/12/13 0557  NA 139  < > 141 140  K 3.3*  < > 3.3* 3.7  CL 101  < > 104 104  CO2 25  < > 30 27  GLUCOSE 133*  < > 83 91  BUN 5*  < > 10 10  CREATININE 0.61  < > 0.76 0.77  CALCIUM 9.0  < > 8.9 8.8  MG 1.5  --   --  1.6  < > = values in this interval not displayed. Liver Function Tests:   Recent Labs Lab 08/07/13 0001 08/09/13 1227  AST 17 23  ALT 8 7  ALKPHOS 99 66  BILITOT 0.6 1.1  PROT 8.5* 7.3  ALBUMIN 4.1 3.3*   CBC:   Recent Labs Lab 08/07/13 0001 08/09/13 0440  WBC 5.3 8.4  NEUTROABS 2.7  --   HGB 15.4 14.7  HCT 45.5 42.5  MCV 86.7 86.4  PLT 165 140*   Coagulation:   Recent  Labs Lab 08/07/13 0001  LABPROT 13.5  INR 1.05   Cardiac Enzymes:   Recent Labs Lab 08/07/13 0001  TROPONINI <0.30   Urinalysis:   Recent Labs Lab 08/07/13 0008 08/09/13 1111  COLORURINE YELLOW YELLOW  LABSPEC 1.015 1.026  PHURINE 7.0 6.0  GLUCOSEU NEGATIVE NEGATIVE  HGBUR TRACE* NEGATIVE  BILIRUBINUR NEGATIVE NEGATIVE  KETONESUR NEGATIVE 15*  PROTEINUR 100* NEGATIVE  UROBILINOGEN 0.2 1.0  NITRITE NEGATIVE NEGATIVE  LEUKOCYTESUR NEGATIVE MODERATE*   Lipid Panel    Component Value Date/Time   CHOL  Value: 55        ATP III CLASSIFICATION:  <200     mg/dL   Desirable  161-096  mg/dL   Borderline High  >=045    mg/dL   High        4/0/9811 0603   TRIG 91 07/07/2010 0603   HDL 32* 07/07/2010 0603   CHOLHDL 1.7 07/07/2010 0603   VLDL 18 07/07/2010 0603   LDLCALC  Value: 5        Total Cholesterol/HDL:CHD Risk Coronary Heart Disease Risk Table                     Men    Women  1/2 Average Risk   3.4   3.3  Average Risk       5.0   4.4  2 X Average Risk   9.6   7.1  3 X Average Risk  23.4   11.0        Use the calculated Patient Ratio above and the CHD Risk Table to determine the patient's CHD Risk.        ATP III CLASSIFICATION (LDL):  <100     mg/dL   Optimal  914-782  mg/dL   Near or Above                    Optimal  130-159  mg/dL   Borderline  956-213  mg/dL   High  >086     mg/dL   Very High 04/12/8468 6295   HgbA1C  Lab Results  Component Value Date   HGBA1C 5.5 04/05/2013    Urine Drug Screen:     Component Value Date/Time   LABOPIA NONE DETECTED 08/07/2013 0008   COCAINSCRNUR NONE DETECTED 08/07/2013 0008   LABBENZ NONE DETECTED 08/07/2013 0008   AMPHETMU NONE DETECTED 08/07/2013 0008   THCU NONE DETECTED 08/07/2013 0008   LABBARB NONE DETECTED 08/07/2013 0008    Alcohol Level:   Recent Labs Lab 08/07/13 0001  ETH <11    CT Head  08/07/2013   1.Three cm posterior left frontal intraparenchymal hematoma.  2. Brain atrophy and extensive chronic small vessel ischemic injury.     CT angio head  08/08/2013 1. Mild distal small vessel disease.  2. No significant vascular lesion associated with the area of the  posterior left frontal hemorrhage.  3. Expected evolution of the hemorrhage without significant interval change in size.  4. Atrophy and extensive white matter disease is advanced for age.  CXR  Cardiomegaly with vascular congestion. No definite acute process  EKG  NSR   EEGThis is a technically difficult record due to the predominence of muscle and movement artifact. May consider repeat of study if clinically indicated  Therapy Recommendations - skilled nursing facility  Physical Exam   Gen: NAD Head: normocephalic.  Neck: supple, no bruits  Cardiac: no murmurs.  Lungs: clear, coarse bilateral breath sounds Abdomen: soft,  no tender, no mass.  Extremities: bilateral LE pitting edema.   Mental Status:  Alert, awake, oriented to place and year.  Dysarthric. Follows simple commands, neglects commands on R side Cranial Nerves:  PERRL,  Eye movements are full range. no ptosis, R facial droop, hearing intact bilat to conversation, cough present, tongue midline  Motor:  Moves Left side spontaneously and against light resistance Dense right HP  Tone: diminished in the right side.  Sensory: LT intact left side, withdraws RUE weakly and RLE briskly to noxious stimuli Deep Tendon Reflexes:  1 all over  Plantars:  Right: upgoing Left: downgoing  Gait: bed rest  ASSESSMENT Francisco Johnston is a 79 y.o. male presenting with right hemiparesis, right facial droop, and dysarthria.. Imaging confirmed a left frontal ICH. Hemorrhage felt to be secondary to malignant Hypertension, with BP 186/139 on arrival. Now with new onset seizure; seizure secondary to hemorrhage. Repeat CT with hemorrhage unchanged. On aspirin 81 mg orally every day prior to admission. Now on no antithrombotics for secondary stroke prevention due to hemorrhage. Patient with resultant right hemiparesis, dysarthria, seizure.    Malignant hypertension, BP 186/139 on arrival, remains elevated, on Norvasc & triamterene/hctz prior to admission, now on Norvasc, diuretic not reordered due to hypokalemia. BP remains elevated, though at SBP goal < 180  New onset seizure secondary to hemorrhage. No further seizures on Depahote.  Colon cancer  Hx of non compliance  Hypokalemia, replacing, following labwork  Urinary tract infection, on cipro  Hospital day # 7  TREATMENT/PLAN  Depacon changed to oral dosing.  On Cipro for UTI. 3 day course started 08/10/13  Chemistries 08/12/13 reveal normal potassium 3.7. Magnesium normal at 1.6. Recheck Monday.  SNF recommended, looking for facility  Patient more alert and is tolerating DYS 1 diet at this time  New onset of atrial fibrillation with rapid ventricular response evening of 08/11/13. Marland Kitchen Required IV labetalol. Unable to  anticoagulate due to recent hemorrhage. Consider metoprolol or Cardizem for rate control. Discussed with Dr. Pearlean Brownie. Will add low dose metoprolol for rate control and low dose aspirin. The patient is now 6 days out from his bleed.  Decrease IV fluids 50 cc per hour per Dr. Pearlean Brownie.  Delton See PA-C [facility] Pager [phone] 08/13/2013, 9:48 AM   I have personally obtained a history, examined the patient, evaluated imaging results, and formulated the assessment and plan of care. I agree with the above.  Delia Heady, MD

## 2013-08-14 LAB — BASIC METABOLIC PANEL
BUN: 11 mg/dL (ref 6–23)
CO2: 22 mEq/L (ref 19–32)
Chloride: 108 mEq/L (ref 96–112)
GFR calc non Af Amer: >90 mL/min (ref >90)
Glucose, Bld: 85 mg/dL (ref 70–99)
Potassium: 3 mEq/L — ABNORMAL LOW (ref 3.5–5.1)
Sodium: 140 mEq/L (ref 135–145)

## 2013-08-14 LAB — CULTURE, BLOOD (ROUTINE X 2): Culture: NO GROWTH

## 2013-08-14 LAB — CBC
HCT: 41.6 % (ref 39.0–52.0)
Hemoglobin: 14.1 g/dL (ref 13.0–17.0)
MCH: 29.3 pg (ref 26.0–34.0)
MCHC: 33.9 g/dL (ref 30.0–36.0)
MCV: 86.3 fL (ref 78.0–100.0)
RBC: 4.82 MIL/uL (ref 4.22–5.81)

## 2013-08-14 LAB — URINALYSIS, ROUTINE W REFLEX MICROSCOPIC
Glucose, UA: NEGATIVE mg/dL
Hgb urine dipstick: NEGATIVE
Ketones, ur: 15 mg/dL — AB
Leukocytes, UA: NEGATIVE
Protein, ur: NEGATIVE mg/dL
pH: 6.5 (ref 5.0–8.0)

## 2013-08-14 MED ORDER — POTASSIUM CHLORIDE CRYS ER 20 MEQ PO TBCR
40.0000 meq | EXTENDED_RELEASE_TABLET | Freq: Once | ORAL | Status: DC
  Filled 2013-08-14: qty 2

## 2013-08-14 NOTE — Clinical Social Work Psychosocial (Signed)
Clinical Social Work Department BRIEF PSYCHOSOCIAL ASSESSMENT 08/14/2013  Patient:  Francisco Johnston, Francisco Johnston     Account Number:  [id number]     Admit date:  08/06/2013  Clinical Social Worker:  Sherre Lain  Date/Time:  08/14/2013 11:07 AM  Referred by:  Physician  Date Referred:  08/14/2013 Referred for  SNF Placement   Other Referral:   none.   Interview type:  Patient Other interview type:   Pt's nieces were present in the room.    PSYCHOSOCIAL DATA Living Status:  ALONE Admitted from facility:   Level of care:   Primary support name:   Primary support relationship to patient:  CHILD, ADULT Degree of support available:   Strong.    CURRENT CONCERNS Current Concerns  Post-Acute Placement   Other Concerns:    SOCIAL WORK ASSESSMENT / PLAN CSW met with pt at bedside. Pt's niece stated that pt was previously from [facility], pt confirmed that he would like to return to [facility]. CSW contacted [facility]. [facility] stated that pt has not been a resident of [facility] for over a year and that [facility] is not contracted with pt's insurance. Pt's clinicals were faxed on Friday (08/11/2013) to [facility]. Pt was faxed out to facilities on 08/11/2013. Bed offers are currently pending as CSW system has been down since Friday with no projected date of resolution. CSW to follow up with pt and pt's family, also to follow up with MD.   Assessment/plan status:  Psychosocial Support/Ongoing Assessment of Needs Other assessment/ plan:   none.   Information/referral to community resources:   SNF placement for [facility], [place].    PATIENTS/FAMILYS RESPONSE TO PLAN OF CARE: Pt was understanding and agreeable to CSW plan of care.       Francisco Johnston, MSW, LCSWA Clinical Social Work [phone]

## 2013-08-14 NOTE — Evaluation (Signed)
Physical Therapy Evaluation Patient Details Name: Francisco Johnston MRN: [id number] DOB: 12/12/1933 Today's Date: 08/14/2013 Time: 8657-8469 PT Time Calculation (min): 23 min  PT Assessment / Plan / Recommendation History of Present Illness  pt presents with L Frontal ICH with R sided deficits and Seizure activity.    Clinical Impression  Pt alert and cooperative (followed commands 90% of time--especially with incr time given to respond). Pt continues with difficulty finding "mid-line" in sitting and worked on this prior to [employer] transfer.      PT Assessment       Follow Up Recommendations  SNF    Does the patient have the potential to tolerate intense rehabilitation      Barriers to Discharge        Equipment Recommendations  None recommended by PT (may not be able to hold RW)    Recommendations for Other Services     Frequency Min 3X/week    Precautions / Restrictions Precautions Precautions: Fall   Pertinent Vitals/Pain No s/s of pain; HR 74 as initiated PT      Mobility  Bed Mobility Bed Mobility: Rolling Right;Rolling Left;Right Sidelying to Sit;Sitting - Scoot to Edge of Bed Rolling Right: 4: Min assist;With rail Right Sidelying to Sit: 2: Max assist;HOB flat;With rails Sitting - Scoot to Edge of Bed: 2: Max assist Details for Bed Mobility Assistance: vc and facilitation for technique; pt required assist due to Rt sided weakness Transfers Transfers: Sit to Stand;Stand to Sit;Squat Pivot Transfers Sit to Stand: 1: +2 Total assist;With upper extremity assist Sit to Stand: Patient Percentage: 60% Stand to Sit: 3: Mod assist Squat Pivot Transfers: 1: +2 Total assist;With upper extremity assistance Squat Pivot Transfers: Patient Percentage: 60% Details for Transfer Assistance: squat-pivot to his Lt; pt reaching with LUE across recliner to far armrest without cues; assist/support to Rt knee to prevent buckling; stood from recliner--assist to weight-shift forward over  BOS and to extend hips, knees, spine Ambulation/Gait Ambulation/Gait Assistance: Not tested (comment) Modified Rankin (Stroke Patients Only) Pre-Morbid Rankin Score:  (unknown; pt unable to communicate) Modified Rankin: Severe disability    Exercises     PT Diagnosis:    PT Problem List:   PT Treatment Interventions:       PT Goals(Current goals can be found in the care plan section)    Visit Information  Last PT Received On: 08/14/13 Assistance Needed: +2 History of Present Illness: pt presents with L Frontal ICH with R sided deficits and Seizure activity.         Prior Functioning       Cognition  Cognition Arousal/Alertness: Awake/alert Behavior During Therapy: Flat affect Overall Cognitive Status: Difficult to assess Difficult to assess due to: Impaired communication    Extremity/Trunk Counsellor Sitting - Balance Support: Left upper extremity supported;Feet supported Static Sitting - Level of Assistance: 3: Mod assist;2: Max assist Static Sitting - Comment/# of Minutes: pt leans strongly to his Rt; max cues and assist to laterally flex to his Lt and rest on his Lt elbow; worked on weightshifing laterally and trunk rotation/extension Cytogeneticist Standing - Balance Support: No upper extremity supported Static Standing - Level of Assistance: 1: +2 Total assist Static Standing - Comment/# of Minutes: stood ~45 seconds; needs assist to extend LLE and right his chest  End of Session PT - End of Session Equipment Utilized During Treatment: Gait belt Activity Tolerance: Patient tolerated treatment well Patient left:  in chair;with call bell/phone within reach;with chair alarm set Nurse Communication: Mobility status  GP     Francisco Johnston 08/14/2013, 3:08 PM Pager [phone]

## 2013-08-14 NOTE — Progress Notes (Signed)
Stroke Team Progress Note  HISTORY  Francisco Johnston is a 79 y.o. male, right handed, with a past medical history significant for HTN, CAD s/p MI a few years ago, bilateral lacunar subcortical infarcts, colon cancer s/p colectomy, transferred to [facility] for further evaluation and management of left frontal ICH.  Patient was not able to provide information about his situation, but family was at bedside and stated that around 11:30 pm on 08/06/2013  Mr. Francisco Johnston was watching TV and suddenly developed difficulty talking, slurred speech, right facial weakness, and right sided weakness. His family took him to [facility] ED where he was found to have " dysarthric speech, mild right facial droop, and dense right hemiparesis arm 1/5 and leg 3/5. Right toe up going and BP 186/139".   An emergent CT brain reveled a 3 cm posterior left frontal intraparenchymal hematoma without extension into the ventricles, mass effect, or hydrocephalus. No recent fall, head trauma, or use of anticoagulants. Upon evaluation, he was awake, followed simple commands, and denied HA, vertigo, double vision, chest pain, shortness of breath, or palpitations.  SUBJECTIVE No family members present today. The patient has no new complaints.  OBJECTIVE Most recent Vital Signs: Filed Vitals:   08/13/13 2134 08/14/13 0200 08/14/13 0625 08/14/13 0922  BP: 145/73 150/65 167/84 154/87  Pulse: 100 77 64 87  Temp: 98.2 F (36.8 C) 98.9 F (37.2 C) 98.1 F (36.7 C) 98.1 F (36.7 C)  TempSrc: Oral Oral Oral Oral  Resp: 20 18 18 18   Height:      Weight:      SpO2: 96% 99% 97% 100%   CBG (last 3)  No results found for this basename: GLUCAP,  in the last 72 hours  IV Fluid Intake:   . sodium chloride 50 mL/hr at 08/13/13 1234    MEDICATIONS  . amLODipine  5 mg Oral Daily  . antiseptic oral rinse  15 mL Mouth Rinse BID  . aspirin EC  81 mg Oral Daily  . divalproex  1,500 mg Oral Daily  . metoprolol tartrate  25 mg Oral BID  .  pantoprazole  40 mg Oral Daily  . potassium chloride  40 mEq Oral Daily  . senna-docusate  1 tablet Oral BID   PRN:  acetaminophen, acetaminophen, hydrALAZINE, RESOURCE THICKENUP CLEAR  Diet:  Dysphagia 1 (patient more alert, so can start diet) Activity:  Ambulate with assist DVT Prophylaxis:  SCD  CLINICALLY SIGNIFICANT STUDIES Basic Metabolic Panel:   Recent Labs Lab 08/07/13 1454  08/12/13 0557 08/14/13 0430  NA 139  < > 140 140  K 3.3*  < > 3.7 3.0*  CL 101  < > 104 108  CO2 25  < > 27 22  GLUCOSE 133*  < > 91 85  BUN 5*  < > 10 11  CREATININE 0.61  < > 0.77 0.62  CALCIUM 9.0  < > 8.8 7.0*  MG 1.5  --  1.6  --   < > = values in this interval not displayed. Liver Function Tests:   Recent Labs Lab 08/09/13 1227  AST 23  ALT 7  ALKPHOS 66  BILITOT 1.1  PROT 7.3  ALBUMIN 3.3*   CBC:   Recent Labs Lab 08/09/13 0440 08/14/13 0430  WBC 8.4 5.9  HGB 14.7 14.1  HCT 42.5 41.6  MCV 86.4 86.3  PLT 140* 113*   Coagulation:  No results found for this basename: LABPROT, INR,  in the last 168 hours Cardiac Enzymes:  No results found for this basename: CKTOTAL, CKMB, CKMBINDEX, TROPONINI,  in the last 168 hours Urinalysis:   Recent Labs Lab 08/09/13 1111  COLORURINE YELLOW  LABSPEC 1.026  PHURINE 6.0  GLUCOSEU NEGATIVE  HGBUR NEGATIVE  BILIRUBINUR NEGATIVE  KETONESUR 15*  PROTEINUR NEGATIVE  UROBILINOGEN 1.0  NITRITE NEGATIVE  LEUKOCYTESUR MODERATE*   Lipid Panel    Component Value Date/Time   CHOL  Value: 55        ATP III CLASSIFICATION:  <200     mg/dL   Desirable  086-578  mg/dL   Borderline High  >=469    mg/dL   High        05/09/9527 0603   TRIG 91 07/07/2010 0603   HDL 32* 07/07/2010 0603   CHOLHDL 1.7 07/07/2010 0603   VLDL 18 07/07/2010 0603   LDLCALC  Value: 5        Total Cholesterol/HDL:CHD Risk Coronary Heart Disease Risk Table                     Men   Women  1/2 Average Risk   3.4   3.3  Average Risk       5.0   4.4  2 X Average Risk   9.6    7.1  3 X Average Risk  23.4   11.0        Use the calculated Patient Ratio above and the CHD Risk Table to determine the patient's CHD Risk.        ATP III CLASSIFICATION (LDL):  <100     mg/dL   Optimal  413-244  mg/dL   Near or Above                    Optimal  130-159  mg/dL   Borderline  010-272  mg/dL   High  >536     mg/dL   Very High 05/10/4033 7425   HgbA1C  Lab Results  Component Value Date   HGBA1C 5.5 04/05/2013    Urine Drug Screen:     Component Value Date/Time   LABOPIA NONE DETECTED 08/07/2013 0008   COCAINSCRNUR NONE DETECTED 08/07/2013 0008   LABBENZ NONE DETECTED 08/07/2013 0008   AMPHETMU NONE DETECTED 08/07/2013 0008   THCU NONE DETECTED 08/07/2013 0008   LABBARB NONE DETECTED 08/07/2013 0008    Alcohol Level:  No results found for this basename: ETH,  in the last 168 hours  CT Head  08/07/2013   1.Three cm posterior left frontal intraparenchymal hematoma.  2. Brain atrophy and extensive chronic small vessel ischemic injury.     CT angio head  08/08/2013 1. Mild distal small vessel disease.  2. No significant vascular lesion associated with the area of the  posterior left frontal hemorrhage.  3. Expected evolution of the hemorrhage without significant interval change in size.  4. Atrophy and extensive white matter disease is advanced for age.  CXR  Cardiomegaly with vascular congestion. No definite acute process  EKG  NSR   EEGThis is a technically difficult record due to the predominence of muscle and movement artifact. May consider repeat of study if clinically indicated  Therapy Recommendations - skilled nursing facility  Physical Exam   Gen: NAD Head: normocephalic.  Neck: supple, no bruits  Cardiac: no murmurs.  Lungs: clear, coarse bilateral breath sounds Abdomen: soft, no tender, no mass.  Extremities: bilateral LE pitting edema.   Mental Status:  Alert, awake, oriented to place and  year. Dysarthric. Follows simple commands, neglects commands on R side Cranial  Nerves:  PERRL,  Eye movements are full range. no ptosis, R facial droop, hearing intact bilat to conversation, cough present, tongue midline  Motor:  Moves Left side spontaneously and against light resistance Dense right HP  Tone: diminished in the right side.  Sensory: LT intact left side, withdraws RUE weakly and RLE briskly to noxious stimuli Deep Tendon Reflexes:  1 all over  Plantars:  Right: upgoing Left: downgoing  Gait: bed rest  ASSESSMENT Mr. HUMBERTO ADDO is a 79 y.o. male presenting with right hemiparesis, right facial droop, and dysarthria.. Imaging confirmed a left frontal ICH. Hemorrhage felt to be secondary to malignant Hypertension, with BP 186/139 on arrival. Now with new onset seizure; seizure secondary to hemorrhage. Repeat CT with hemorrhage unchanged. On aspirin 81 mg orally every day prior to admission. Now on no antithrombotics for secondary stroke prevention due to hemorrhage. Patient with resultant right hemiparesis, dysarthria, seizure.    Malignant hypertension, BP 186/139 on arrival, remains elevated, on Norvasc & triamterene/hctz prior to admission, now on Norvasc, diuretic not reordered due to hypokalemia. BP remains elevated, though at SBP goal < 180  New onset seizure secondary to hemorrhage. No further seizures on Depacon  Colon cancer  Hx of non compliance  Hypokalemia, replacing, following labwork  Urinary tract infection, on cipro, course finished.  New onset atrial fibrillation on 08/11/2013, rate controlled, cannot use antithrombotics due to hemorrhage.  Hospital day # 8  TREATMENT/PLAN  Depacon, continue for seizure cessation  Hypokalemia, replace. Recheck electrolytes (K+, Mg+) in am.   Recheck urine (previous UTI on cipro, male)  SNF on discharge  Start DYS diet.  Review labs/urine/oral intake and if stable plan to SNF in am if hemodynamically stable  Larita Fife D. Manson Passey, [facility], MBA, MHA [facility] Pager:  [phone] 08/14/2013 3:16 PM   I have personally obtained a history, examined the patient, evaluated imaging results, and formulated the assessment and plan of care. I agree with the above.  Delia Heady, MD

## 2013-08-14 NOTE — Progress Notes (Signed)
Speech Language Pathology Treatment Patient Details Name: Francisco Johnston MRN: [id number] DOB: 08-Jun-1934 Today's Date: 08/14/2013 Time: 0102-7253 SLP Time Calculation (min): 13 min  Assessment / Plan / Recommendation Clinical Impression  SLP provided moderate verbal and tactile cues to decrease anterior spillage on right. Pt still with significantly delayed swallow response with struggle to respond to cues. Pt would not tolerate upgrade to thin liquids at this time as a chin tuck would be required per MBS. Recommend continue current diet (dys 1/nectar) after d/c. SLP also provided max verbal cues for closed questions with 50% intelligible response. Pt requires verbal and visual cues to respond intelligibly to Y/N question regarding wants and needs as he tends to speak at phrase level without awareness of deficits.     SLP Plan  Continue with current plan of care    Pertinent Vitals/Pain NA  SLP Goals  SLP Goals Potential to Achieve Goals: Good Progress/Goals/Alternative treatment plan discussed with pt/caregiver and they: Agree SLP Goal #1: Patient will verbally communicate basic needs/wants with moderate SLP cues SLP Goal #1 - Progress: Progressing toward goal SLP Goal #2: Patient will increase speech intelligibility to 75% during basic conversation with moderate cues for overarticulation.  SLP Goal #2 - Progress: Progressing toward goal SLP Goal #3: Patient will follow basic 1 step commands during functional ADL with min cues.  SLP Goal #3 - Progress: Progressing toward goal  General Temperature Spikes Noted: No Respiratory Status: Room air Behavior/Cognition: Alert;Cooperative;Pleasant mood;Requires cueing;Decreased sustained attention Oral Cavity - Dentition: Edentulous Patient Positioning: Upright in bed  Oral Cavity - Oral Hygiene Does patient have any of the following "at risk" factors?: Other - dysphagia Patient is AT RISK - Oral Care Protocol followed (see row info): Yes    Treatment Treatment focused on: Cognition;Dysarthria (dysphagia)   GO    Harlon Ditty, MA CCC-SLP [phone]   Claudine Mouton 08/14/2013, 11:28 AM

## 2013-08-15 DIAGNOSIS — Z79899 Other long term (current) drug therapy: Secondary | ICD-10-CM

## 2013-08-15 DIAGNOSIS — S31000A Unspecified open wound of lower back and pelvis without penetration into retroperitoneum, initial encounter: Secondary | ICD-10-CM

## 2013-08-15 DIAGNOSIS — I10 Essential (primary) hypertension: Secondary | ICD-10-CM | POA: Diagnosis present

## 2013-08-15 DIAGNOSIS — N390 Urinary tract infection, site not specified: Secondary | ICD-10-CM | POA: Diagnosis present

## 2013-08-15 DIAGNOSIS — I619 Nontraumatic intracerebral hemorrhage, unspecified: Secondary | ICD-10-CM | POA: Diagnosis present

## 2013-08-15 DIAGNOSIS — R569 Unspecified convulsions: Secondary | ICD-10-CM

## 2013-08-15 DIAGNOSIS — I6381 Other cerebral infarction due to occlusion or stenosis of small artery: Secondary | ICD-10-CM | POA: Diagnosis present

## 2013-08-15 DIAGNOSIS — I69122 Dysarthria following nontraumatic intracerebral hemorrhage: Secondary | ICD-10-CM

## 2013-08-15 DIAGNOSIS — I69351 Hemiplegia and hemiparesis following cerebral infarction affecting right dominant side: Secondary | ICD-10-CM

## 2013-08-15 LAB — BASIC METABOLIC PANEL
BUN: 10 mg/dL (ref 6–23)
CO2: 27 mEq/L (ref 19–32)
Chloride: 102 mEq/L (ref 96–112)
Creatinine, Ser: 0.64 mg/dL (ref 0.50–1.35)
GFR calc Af Amer: >90 mL/min (ref >90)
Potassium: 3.5 mEq/L (ref 3.5–5.1)

## 2013-08-15 MED ORDER — AMLODIPINE BESYLATE 5 MG PO TABS: 5.0000 mg | ORAL_TABLET | Freq: Every day | ORAL | Status: DC

## 2013-08-15 MED ORDER — DIVALPROEX SODIUM 125 MG PO CPSP: 750.0000 mg | ORAL_CAPSULE | Freq: Two times a day (BID) | ORAL | Status: DC

## 2013-08-15 MED ORDER — DIVALPROEX SODIUM 125 MG PO CPSP
750.0000 mg | ORAL_CAPSULE | Freq: Two times a day (BID) | ORAL | Status: DC
  Administered 2013-08-15: 750 mg via ORAL
  Filled 2013-08-15 (×2): qty 6

## 2013-08-15 MED ORDER — POTASSIUM CHLORIDE CRYS ER 20 MEQ PO TBCR: 40.0000 meq | EXTENDED_RELEASE_TABLET | Freq: Every day | ORAL | Status: DC

## 2013-08-15 MED ORDER — ASPIRIN 81 MG PO CHEW: 81.0000 mg | CHEWABLE_TABLET | Freq: Every day | ORAL | Status: DC

## 2013-08-15 MED ORDER — RESOURCE THICKENUP CLEAR PO POWD: 1.0000 | ORAL | Status: DC | PRN

## 2013-08-15 MED ORDER — METOPROLOL TARTRATE 25 MG PO TABS: 25.0000 mg | ORAL_TABLET | Freq: Two times a day (BID) | ORAL | Status: DC

## 2013-08-15 MED ORDER — PANTOPRAZOLE SODIUM 40 MG PO PACK
40.0000 mg | PACK | Freq: Every day | ORAL | Status: DC
  Administered 2013-08-15: 40 mg via ORAL
  Filled 2013-08-15 (×2): qty 20

## 2013-08-15 MED ORDER — PANTOPRAZOLE SODIUM 40 MG PO PACK: PACK | ORAL | Status: DC

## 2013-08-15 NOTE — Progress Notes (Signed)
Report called to [facility] spoke with Pecola Lawless awaiting PTAR for pick up condition stable

## 2013-08-15 NOTE — Progress Notes (Signed)
Occupational Therapy Treatment Patient Details Name: MAKIAH CLAUSON MRN: [id number] DOB: 10-Aug-1934 Today's Date: 08/15/2013 Time: 4098-1191 OT Time Calculation (min): 28 min  OT Assessment / Plan / Recommendation  History of present illness 79 y.o. male admitted to [facility] on 08/06/13 with  L Frontal ICH with R sided deficits and Seizure activity.    OT comments  Pt progressing towards goals. Pt sat EOB and performed grooming tasks while working on balance. Pt performed squat pivot transfer from bed to recliner chair. Pt more alert today.  Follow Up Recommendations  SNF;Supervision/Assistance - 24 hour    Barriers to Discharge       Equipment Recommendations  Other (comment) (tbd)    Recommendations for Other Services    Frequency Min 2X/week   Progress towards OT Goals Progress towards OT goals: Progressing toward goals  Plan Discharge plan remains appropriate    Precautions / Restrictions Precautions Precautions: Fall Precaution Comments: right hemiperesis, pushes right.   Restrictions Weight Bearing Restrictions: No   Pertinent Vitals/Pain No pain.     ADL  Grooming: Performed;Teeth care;Wash/dry face;Brushing hair;Moderate assistance;Minimal assistance;Set up;Supervision/safety (also rubbed lotion on hand sitting in chair-setup/supervision; Teeth care, brushing hair, and washing face fluctuated between Mod A/Min A/Standby A for balance) Where Assessed - Grooming: Supported sitting Lower Body Dressing: Other (comment);Moderate assistance;Maximal assistance (pt leaned foward to pull socks up on EOB) Toilet Transfer: Simulated;+2 Total assistance Toilet Transfer: Patient Percentage: 50% Toilet Transfer Method: Squat pivot Toilet Transfer Equipment: Other (comment) (from elevated bed to recliner chair) Transfers/Ambulation Related to ADLs: Squat pivot transfer from bed to recliner chair and also sit <> stand transfers +2 total A (50%).  ADL Comments: Mod A/Max A for balance  during task of leaning foward and pulling up socks and returning back to sitting position. Pt more alert today and following commands better. Assist level for balance fluctuated during grooming tasks of brushing hair, washing face, and teeth care (Min A, Mod A, standby A). Assistance to turn comb correct direction and provided hand over hand to get started. Pt also opened mouth like he was going to put comb in mouth versus brushing hair.    OT Diagnosis:    OT Problem List:   OT Treatment Interventions:     OT Goals(current goals can now be found in the care plan section) Acute Rehab OT Goals Patient Stated Goal: NA OT Goal Formulation: Patient unable to participate in goal setting Time For Goal Achievement: 08/16/13 Potential to Achieve Goals: Fair ADL Goals Pt Will Perform Grooming: with supervision;with set-up;sitting Pt Will Transfer to Toilet: with min assist;stand pivot transfer;bedside commode Additional ADL Goal #1: Pt will perform bed mobility with Mod A as precursor for EOB ADLs.  Additional ADL Goal #2: Pt will sit EOB and perform functional task with Min A for balance.   Visit Information  Last OT Received On: 08/15/13 Assistance Needed: +2 PT/OT Co-Evaluation/Treatment: Yes History of Present Illness: 79 y.o. male admitted to [facility] on 08/06/13 with  L Frontal ICH with R sided deficits and Seizure activity.     Subjective Data      Prior Functioning       Cognition  Cognition Arousal/Alertness: Awake/alert Behavior During Therapy: Flat affect Overall Cognitive Status: Impaired/Different from baseline Area of Impairment: Awareness Awareness: Intellectual Difficult to assess due to: Impaired communication    Mobility  Bed Mobility Bed Mobility: Supine to Sit;Sitting - Scoot to Edge of Bed Supine to Sit: 2: Max assist Sitting - Scoot to  Edge of Bed: 2: Max assist Details for Bed Mobility Assistance: max assist to progress right leg to EOB and max hand held assist  (left) for pt to pull his trunk up to sitting.  Once sitting max assist to maintain balance while using bed pads to weight shfit hips to scoot to EOB.   Transfers Transfers: Sit to Stand;Stand to Sit Sit to Stand: 1: +2 Total assist;From elevated surface;With upper extremity assist;With armrests;From bed Sit to Stand: Patient Percentage: 50% Stand to Sit: 1: +2 Total assist;With upper extremity assist;With armrests;To chair/3-in-1 Stand to Sit: Patient Percentage: 50% Details for Transfer Assistance: Pt needed max verbal and manual cues to reach for far armrest of the chair to break his right pushing in sitting and get his body moving in the direction of the chair on his left side.  Support given at trunk as pt took 1-2 small pivotal steps around to the recliner.  Pt did not stand all the way upright to transfer.      Exercises      Balance Balance Balance Assessed: Yes Static/Dynamic Sitting Balance Static/Dynamic Sitting - Balance Support: ;Feet supported Static/Dynamic Sitting - Level of Assistance: 3: Mod assist;4: Min assist;5: Stand by assistance Static/Dynamic Sitting - Comment/# of Minutes: Pt assist level fluctuated as we sat EOB and worked on sitting balance and ADL tasks with OT. Pt needed manual, tactile and verbal cues to find midline in sitting as his tendancy was to push to the right and posteriorly. Pt was able to perform some reaching outside of BOS and removing left arm for stability. He could get up to supervision (very close) for a period of 30-45 seconds at a time . Pt was following >75% of one step commands in sitting.  Dynamic Sitting Balance Dynamic Sitting - Balance Support: Feet supported;Left upper extremity supported;No upper extremity supported Dynamic Sitting - Level of Assistance: 3: Mod assist;2: Max assist Dynamic Sitting - Comments: mod to max assist needed during transitional movements especially when coming up from leaning over or reaching he would throw back  into extension and needed help to come up from being forward.    End of Session OT - End of Session Equipment Utilized During Treatment: Gait belt Activity Tolerance: Patient tolerated treatment well Patient left: in chair;with call bell/phone within reach;with chair alarm set;with nursing/sitter in room (nursing student) Nurse Communication: Other (comment) (positioning of RUE)  GO     Earlie Raveling OTR/L [phone] 08/15/2013, 2:04 PM

## 2013-08-15 NOTE — Progress Notes (Addendum)
Physical Therapy Treatment Patient Details Name: Francisco Johnston MRN: [id number] DOB: [birth year] Today's Date: 08/15/2013 Time: 4098-1191 PT Time Calculation (min): 30 min  PT Assessment / Plan / Recommendation  History of Present Illness 79 y.o. male admitted to [facility] on 08/06/13 with  L Frontal ICH with R sided deficits and Seizure activity.    PT Comments   Pt is progressing, albeit slowly, but progressing every session.  He seemed more alert and able to follow one step commands today.  He continues to be SNF appropriate at discharge as he has a long rehab course ahead of him.    Follow Up Recommendations  SNF     Does the patient have the potential to tolerate intense rehabilitation    Yes  Barriers to Discharge   None      Equipment Recommendations  Wheelchair (measurements PT);Wheelchair cushion (measurements PT);Hospital bed    Recommendations for Other Services   None  Frequency Min 3X/week   Progress towards PT Goals Progress towards PT goals: Progressing toward goals  Plan Current plan remains appropriate    Precautions / Restrictions Precautions Precautions: Fall Precaution Comments: right hemiperesis, pushes right.   Restrictions Weight Bearing Restrictions: No   Pertinent Vitals/Pain See vitals flow sheet.     Mobility  Bed Mobility Bed Mobility: Supine to Sit;Sitting - Scoot to Edge of Bed Supine to Sit: 2: Max assist Sitting - Scoot to [place] of Bed: 2: Max assist Details for Bed Mobility Assistance: max assist to progress right leg to EOB and max hand held assist (left) for pt to pull his trunk up to sitting.  Once sitting max assist to maintain balance while using bed pads to weight shfit hips to scoot to EOB.   Transfers Transfers: Sit to Stand;Stand to Sit;Stand Pivot Transfers Sit to Stand: 1: +2 Total assist;From elevated surface;With upper extremity assist;With armrests;From bed Sit to Stand: Patient Percentage: 50% Stand to Sit: 1: +2 Total  assist;With upper extremity assist;With armrests;To chair/3-in-1 Stand to Sit: Patient Percentage: 50% Squat Pivot Transfers: 1: +2 Total assist;From elevated surface;With upper extremity assistance;With armrests Squat Pivot Transfers: Patient Percentage: 50% Details for Transfer Assistance: Pt needed max verbal and manual cues to reach for far armrest of the chair to break his right pushing in sitting and get his body moving in the direction of the chair on his left side.  Support given at trunk as pt took 1-2 small pivotal steps around to the recliner.  Pt did not stand all the way upright to transfer.   Ambulation/Gait Ambulation/Gait Assistance: Not tested (comment) (pt not ready yet. ) Modified Rankin (Stroke Patients Only) Modified Rankin: Severe disability      PT Goals (current goals can now be found in the care plan section) Acute Rehab PT Goals Patient Stated Goal: NA  Visit Information  Last PT Received On: 08/15/13 Assistance Needed: +2 PT/OT Co-Evaluation/Treatment: Yes History of Present Illness: 79 y.o. male admitted to [facility] on 08/06/13 with  L Frontal ICH with R sided deficits and Seizure activity.     Subjective Data  Subjective: Pt attempting to say more during today's session.  Some short one or two word phrases.  ~50% of the time intelligable.   Patient Stated Goal: NA   Cognition  Cognition Arousal/Alertness: Awake/alert Behavior During Therapy: Flat affect Overall Cognitive Status: Impaired/Different from baseline Area of Impairment: Awareness Awareness: Intellectual Difficult to assess due to: Impaired communication    Balance  Balance Balance Assessed: Yes Static Sitting  Balance Static Sitting - Balance Support: Feet supported Static Sitting - Level of Assistance: 3: Mod assist;4: Min assist;5: Stand by assistance Static Sitting - Comment/# of Minutes: Pt assist level fluctuated as we sat EOB and worked on sitting balance and ADL tasks with OT. Pt needed  manual, tactile and verbal cues to find midline in sitting as his tendancy was to push to the right and posteriorly. Pt was able to preform some reaching outside of BOS and removing left arm for stability. He could get up to supervision (very close) for a period of 30-45 seconds at a time . Pt was following >75% of one step commands in sitting.  Dynamic Sitting Balance Dynamic Sitting - Balance Support: Feet supported;Left upper extremity supported;No upper extremity supported Dynamic Sitting - Level of Assistance: 3: Mod assist;2: Max assist Dynamic Sitting - Comments: mod to max assist needed during transitional movements especially when coming up from leaning over or reaching he would throw back into extension and needed help to come up from being forward.   End of Session PT - End of Session Equipment Utilized During Treatment: Gait belt Activity Tolerance: Patient tolerated treatment well Patient left: in chair;with call bell/phone within reach;with chair alarm set;with nursing/sitter in room Interior and spatial designer)     Lurena Joiner B. Ovie Cornelio, PT, DPT [phone]   08/15/2013, 2:19 PM

## 2013-08-15 NOTE — Progress Notes (Signed)
Pt picked up BY PTAR  En route to [facility] condition stable

## 2013-08-15 NOTE — Discharge Summary (Signed)
Stroke Discharge Summary  Patient ID: Francisco Johnston   MRN: [id number]      DOB: Mar 13, 1934  Date of Admission: 08/06/2013 Date of Discharge: 08/15/2013  Attending Physician:  Darcella Cheshire, MD, Stroke MD  Consulting Physician(s):   Treatment Team:  Md Stroke, MD None  Patient's PCP:  [name], MD  Discharge Diagnoses:  Principal Problem:  Left Frontal  ICH (intracerebral hemorrhage) due to malignant hypertension Active Problems:   HTN (hypertension)   Chronic ischemic heart disease, unspecified   CVA (cerebral vascular accident)   Malignant hypertension   Convulsions/seizures   UTI (urinary tract infection)   Dysarthria following nontraumatic intracerebral hemorrhage   Hemiparesis affecting right side as late effect of cerebrovascular accident   Lacunar infarction   Encounter for long-term (current) use of high-risk medication   Sacral wound  BMI: Body mass index is 21.26 kg/(m^2).  Past Medical History  Diagnosis Date  . Heart disease   . Hypertension   . Cancer     colon  . Adenocarcinoma of colon 06/12/2011  . Iron deficiency anemia 06/12/2011  . CAD (coronary artery disease)   . Noncompliance   . ED (erectile dysfunction)   . IFG (impaired fasting glucose)   . Lacunar stroke    Past Surgical History  Procedure Laterality Date  . Colectomy    . Colonoscopy        Medication List    STOP taking these medications       aspirin 81 MG tablet  Replaced by:  aspirin 81 MG chewable tablet     iron polysaccharides 150 MG capsule  Commonly known as:  NIFEREX     triamterene-hydrochlorothiazide 37.5-25 MG per tablet  Commonly known as:  MAXZIDE-25      TAKE these medications       amLODipine 5 MG tablet  Commonly known as:  NORVASC  Take 1 tablet (5 mg total) by mouth daily.     aspirin 81 MG chewable tablet  Chew 1 tablet (81 mg total) by mouth daily.  Start taking on:  08/16/2013     divalproex 125 MG capsule  Commonly known as:  DEPAKOTE  SPRINKLE  Take 6 capsules (750 mg total) by mouth 2 (two) times daily.     metoprolol tartrate 25 MG tablet  Commonly known as:  LOPRESSOR  Take 1 tablet (25 mg total) by mouth 2 (two) times daily.     pantoprazole sodium 40 mg/20 mL Pack  Commonly known as:  PROTONIX  Or may use 40mg  tablets     potassium chloride SA 20 MEQ tablet  Commonly known as:  K-DUR,KLOR-CON  Take 2 tablets (40 mEq total) by mouth daily.     RESOURCE THICKENUP CLEAR Powd  Take 120 g by mouth as needed.        LABORATORY STUDIES CBC    Component Value Date/Time   WBC 5.9 08/14/2013 0430   RBC 4.82 08/14/2013 0430   RBC 2.70* 07/04/2010 1517   HGB 14.1 08/14/2013 0430   HCT 41.6 08/14/2013 0430   PLT 113* 08/14/2013 0430   MCV 86.3 08/14/2013 0430   MCH 29.3 08/14/2013 0430   MCHC 33.9 08/14/2013 0430   RDW 12.5 08/14/2013 0430   LYMPHSABS 2.1 08/07/2013 0001   MONOABS 0.4 08/07/2013 0001   EOSABS 0.2 08/07/2013 0001   BASOSABS 0.0 08/07/2013 0001   CMP    Component Value Date/Time   NA 138 08/15/2013 0555   K  3.5 08/15/2013 0555   CL 102 08/15/2013 0555   CO2 27 08/15/2013 0555   GLUCOSE 86 08/15/2013 0555   BUN 10 08/15/2013 0555   CREATININE 0.64 08/15/2013 0555   CALCIUM 8.7 08/15/2013 0555   PROT 7.3 08/09/2013 1227   ALBUMIN 3.3* 08/09/2013 1227   AST 23 08/09/2013 1227   ALT 7 08/09/2013 1227   ALKPHOS 66 08/09/2013 1227   BILITOT 1.1 08/09/2013 1227   GFRNONAA >90 08/15/2013 0555   GFRAA >90 08/15/2013 0555   COAGS Lab Results  Component Value Date   INR 1.05 08/07/2013   INR 1.22 07/04/2010   Lipid Panel    Component Value Date/Time   CHOL  Value: 55        ATP III CLASSIFICATION:  <200     mg/dL   Desirable  469-629  mg/dL   Borderline High  >=528    mg/dL   High        03/08/3243 0603   TRIG 91 07/07/2010 0603   HDL 32* 07/07/2010 0603   CHOLHDL 1.7 07/07/2010 0603   VLDL 18 07/07/2010 0603   LDLCALC  Value: 5        Total Cholesterol/HDL:CHD Risk Coronary Heart Disease Risk Table                     Men   Women  1/2 Average Risk    3.4   3.3  Average Risk       5.0   4.4  2 X Average Risk   9.6   7.1  3 X Average Risk  23.4   11.0        Use the calculated Patient Ratio above and the CHD Risk Table to determine the patient's CHD Risk.        ATP III CLASSIFICATION (LDL):  <100     mg/dL   Optimal  010-272  mg/dL   Near or Above                    Optimal  130-159  mg/dL   Borderline  536-644  mg/dL   High  >034     mg/dL   Very High 06/09/2594 6387   HgbA1C  Lab Results  Component Value Date   HGBA1C 5.5 04/05/2013   Cardiac Panel (last 3 results) No results found for this basename: CKTOTAL, CKMB, TROPONINI, RELINDX,  in the last 72 hours Urinalysis    Component Value Date/Time   COLORURINE YELLOW 08/14/2013 1139   APPEARANCEUR CLEAR 08/14/2013 1139   LABSPEC 1.018 08/14/2013 1139   PHURINE 6.5 08/14/2013 1139   GLUCOSEU NEGATIVE 08/14/2013 1139   HGBUR NEGATIVE 08/14/2013 1139   BILIRUBINUR NEGATIVE 08/14/2013 1139   KETONESUR 15* 08/14/2013 1139   PROTEINUR NEGATIVE 08/14/2013 1139   UROBILINOGEN 0.2 08/14/2013 1139   NITRITE NEGATIVE 08/14/2013 1139   LEUKOCYTESUR NEGATIVE 08/14/2013 1139   Urine Drug Screen     Component Value Date/Time   LABOPIA NONE DETECTED 08/07/2013 0008   COCAINSCRNUR NONE DETECTED 08/07/2013 0008   LABBENZ NONE DETECTED 08/07/2013 0008   AMPHETMU NONE DETECTED 08/07/2013 0008   THCU NONE DETECTED 08/07/2013 0008   LABBARB NONE DETECTED 08/07/2013 0008    Alcohol Level    Component Value Date/Time   ETH <11 08/07/2013 0001     SIGNIFICANT DIAGNOSTIC STUDIES CT Head  08/07/2013 1.Three cm posterior left frontal intraparenchymal hematoma. 2. Brain atrophy and extensive chronic small vessel ischemic injury.  CT angio head  08/08/2013 1. Mild distal small vessel disease. 2. No significant vascular lesion associated with the area of the posterior left frontal hemorrhage. 3. Expected evolution of the hemorrhage without significant interval change in size. 4. Atrophy and extensive white matter disease is advanced for age.   CXR Cardiomegaly with vascular congestion. No definite acute process  EKG NSR  EEGThis is a technically difficult record due to the predominence of muscle and movement artifact. May consider repeat of study if clinically indicated      History of Present Illness     Francisco Johnston is a 79 y.o. male, right handed, with a past medical history significant for HTN, CAD s/p MI a few years ago, bilateral lacunar subcortical infarcts, colon cancer s/p colectomy, transferred to [facility] for further evaluation and management of left frontal ICH.   Patient was not able to provide information about his situation, but family was at bedside and stated that around 11:30 pm on 08/06/2013 Francisco Johnston was watching TV and suddenly developed difficulty talking, slurred speech, right facial weakness, and right sided weakness. His family took him to [facility] ED where he was found to have " dysarthric speech, mild right facial droop, and dense right hemiparesis arm 1/5 and leg 3/5. Right toe up going and BP 186/139".  An emergent CT brain reveled a 3 cm posterior left frontal intraparenchymal hematoma without extension into the ventricles, mass effect, or hydrocephalus. No recent fall, head trauma, or use of anticoagulants. Upon evaluation, he was awake, followed simple commands, and denied HA, vertigo, double vision, chest pain, shortness of breath, or palpitations   Hospital Course   Francisco Johnston is a 79 y.o. male presenting with right hemiparesis, right facial droop, and dysarthria.. Imaging confirmed a left frontal ICH. Hemorrhage felt to be secondary to malignant Hypertension, with BP 186/139 on arrival. Now with new onset seizure; seizure secondary to hemorrhage. Repeat CT with hemorrhage unchanged. On aspirin 81 mg orally every day prior to admission. Initially patient was on no antithrombotics for secondary stroke prevention due to hemorrhage; however he has remained neurologically stable so he has been  restarted on baby aspirin.  Malignant hypertension, BP 186/139 on arrival. Eventually restarted on outpatient medications. Diuretic not reordered due to hypokalemia. New onset seizure secondary to hemorrhage. No further seizures on Depacon Colon cancer history Hx of non compliance  Hypokalemia, replaced Urinary tract infection, on cipro, course finished. Follow up Urine, clear.  New onset atrial fibrillation on 08/11/2013, rate controlled, now back on baby aspirin for ischemic stroke prevention. Depacon, continue for seizure cessation  Hypokalemia, replace. Recheck electrolytes (K+, Mg+) in am.  SNF on discharge   Patient with continued stroke symptoms of right hemiparesis, dysarthria. Physical therapy, occupational therapy and speech therapy evaluated patient. They recommend SNF  Discharge Exam  Blood pressure 159/87, pulse 98, temperature 97.4 F (36.3 C), temperature source Axillary, resp. rate 18, height 5\' 11"  (1.803 m), weight 69.1 kg (152 lb 5.4 oz), SpO2 96.00%.   Mental Status:  Alert, awake, oriented to place and year. Dysarthric. Follows simple commands, neglects commands on R side  Cranial Nerves:  PERRL, Eye movements are full range. no ptosis, R facial droop, hearing intact bilat to conversation, cough present, tongue midline  Motor:  Moves Left side spontaneously and against light resistance  Dense right HP  Tone: diminished in the right side.  Sensory: LT intact left side, withdraws RUE weakly and RLE briskly to noxious stimuli  Deep Tendon Reflexes:  1 all over  Plantars:  Right: upgoing Left: downgoing  Gait: bed rest   Discharge Diet   Dysphagia 1/nectar liquids  Discharge Plan    Disposition:  SNF   aspirin 81 mg orally every day for secondary stroke prevention.  Ongoing risk factor control by Primary Care Physician. Risk factor recommendations:  Hypertension target range 130-140/70-80 Lipid range - LDL < 100 and checked every 6 months, fasting Diabetes  - HgB A1C <7   Follow-up LUKING,W S, MD in 1 month.  Follow-up with Dr. Delia Heady, Stroke Clinic in 2 months.  55 minutes were spent preparing discharge.  Signed  Gwendolyn Lima. Manson Passey, [facility], MBA, MHA [facility] Pager: [phone] 08/15/2013 2:53 PM  I have personally examined this patient, reviewed pertinent data and developed the plan of care. I agree with above. Delia Heady, MD

## 2013-09-04 ENCOUNTER — Other Ambulatory Visit (HOSPITAL_COMMUNITY): Payer: Self-pay | Admitting: Family Medicine

## 2013-09-28 ENCOUNTER — Other Ambulatory Visit (HOSPITAL_COMMUNITY): Payer: Medicare Other

## 2013-09-28 ENCOUNTER — Other Ambulatory Visit (HOSPITAL_COMMUNITY): Payer: Self-pay | Admitting: Oncology

## 2013-09-28 DIAGNOSIS — R590 Localized enlarged lymph nodes: Secondary | ICD-10-CM

## 2013-09-28 DIAGNOSIS — C189 Malignant neoplasm of colon, unspecified: Secondary | ICD-10-CM

## 2013-09-28 DIAGNOSIS — Z8546 Personal history of malignant neoplasm of prostate: Secondary | ICD-10-CM

## 2013-10-03 ENCOUNTER — Other Ambulatory Visit (HOSPITAL_COMMUNITY): Payer: Medicare Other

## 2013-10-11 ENCOUNTER — Emergency Department (HOSPITAL_COMMUNITY): Payer: Medicare Other

## 2013-10-11 ENCOUNTER — Encounter (HOSPITAL_COMMUNITY): Payer: Self-pay | Admitting: Emergency Medicine

## 2013-10-11 ENCOUNTER — Inpatient Hospital Stay (HOSPITAL_COMMUNITY)
Admission: EM | Admit: 2013-10-11 | Discharge: 2013-10-23 | DRG: 166 | Disposition: A | Discharge status: Skilled Nursing Facility | Payer: Medicare Other | Attending: Internal Medicine | Admitting: Internal Medicine

## 2013-10-11 DIAGNOSIS — E876 Hypokalemia: Secondary | ICD-10-CM | POA: Diagnosis not present

## 2013-10-11 DIAGNOSIS — I69959 Hemiplegia and hemiparesis following unspecified cerebrovascular disease affecting unspecified side: Secondary | ICD-10-CM

## 2013-10-11 DIAGNOSIS — I252 Old myocardial infarction: Secondary | ICD-10-CM

## 2013-10-11 DIAGNOSIS — R1310 Dysphagia, unspecified: Secondary | ICD-10-CM | POA: Diagnosis present

## 2013-10-11 DIAGNOSIS — G40909 Epilepsy, unspecified, not intractable, without status epilepticus: Secondary | ICD-10-CM | POA: Diagnosis present

## 2013-10-11 DIAGNOSIS — Z79899 Other long term (current) drug therapy: Secondary | ICD-10-CM

## 2013-10-11 DIAGNOSIS — N179 Acute kidney failure, unspecified: Secondary | ICD-10-CM | POA: Diagnosis not present

## 2013-10-11 DIAGNOSIS — J189 Pneumonia, unspecified organism: Principal | ICD-10-CM | POA: Diagnosis present

## 2013-10-11 DIAGNOSIS — D696 Thrombocytopenia, unspecified: Secondary | ICD-10-CM | POA: Diagnosis not present

## 2013-10-11 DIAGNOSIS — I4892 Unspecified atrial flutter: Secondary | ICD-10-CM | POA: Diagnosis not present

## 2013-10-11 DIAGNOSIS — E872 Acidosis, unspecified: Secondary | ICD-10-CM | POA: Diagnosis not present

## 2013-10-11 DIAGNOSIS — Z7982 Long term (current) use of aspirin: Secondary | ICD-10-CM

## 2013-10-11 DIAGNOSIS — Z681 Body mass index (BMI) 19 or less, adult: Secondary | ICD-10-CM

## 2013-10-11 DIAGNOSIS — N170 Acute kidney failure with tubular necrosis: Secondary | ICD-10-CM | POA: Diagnosis not present

## 2013-10-11 DIAGNOSIS — R64 Cachexia: Secondary | ICD-10-CM | POA: Diagnosis present

## 2013-10-11 DIAGNOSIS — D72829 Elevated white blood cell count, unspecified: Secondary | ICD-10-CM | POA: Diagnosis present

## 2013-10-11 DIAGNOSIS — I4891 Unspecified atrial fibrillation: Secondary | ICD-10-CM | POA: Diagnosis present

## 2013-10-11 DIAGNOSIS — Z22322 Carrier or suspected carrier of Methicillin resistant Staphylococcus aureus: Secondary | ICD-10-CM

## 2013-10-11 DIAGNOSIS — E870 Hyperosmolality and hypernatremia: Secondary | ICD-10-CM | POA: Diagnosis not present

## 2013-10-11 DIAGNOSIS — I69991 Dysphagia following unspecified cerebrovascular disease: Secondary | ICD-10-CM

## 2013-10-11 DIAGNOSIS — R5381 Other malaise: Secondary | ICD-10-CM | POA: Diagnosis present

## 2013-10-11 DIAGNOSIS — I69920 Aphasia following unspecified cerebrovascular disease: Secondary | ICD-10-CM

## 2013-10-11 DIAGNOSIS — Z85038 Personal history of other malignant neoplasm of large intestine: Secondary | ICD-10-CM

## 2013-10-11 DIAGNOSIS — E119 Type 2 diabetes mellitus without complications: Secondary | ICD-10-CM | POA: Diagnosis present

## 2013-10-11 DIAGNOSIS — I639 Cerebral infarction, unspecified: Secondary | ICD-10-CM | POA: Diagnosis present

## 2013-10-11 DIAGNOSIS — I69351 Hemiplegia and hemiparesis following cerebral infarction affecting right dominant side: Secondary | ICD-10-CM

## 2013-10-11 DIAGNOSIS — E861 Hypovolemia: Secondary | ICD-10-CM | POA: Diagnosis present

## 2013-10-11 DIAGNOSIS — I69122 Dysarthria following nontraumatic intracerebral hemorrhage: Secondary | ICD-10-CM

## 2013-10-11 DIAGNOSIS — R569 Unspecified convulsions: Secondary | ICD-10-CM

## 2013-10-11 DIAGNOSIS — I2510 Atherosclerotic heart disease of native coronary artery without angina pectoris: Secondary | ICD-10-CM | POA: Diagnosis present

## 2013-10-11 DIAGNOSIS — A419 Sepsis, unspecified organism: Secondary | ICD-10-CM | POA: Insufficient documentation

## 2013-10-11 DIAGNOSIS — I10 Essential (primary) hypertension: Secondary | ICD-10-CM | POA: Diagnosis present

## 2013-10-11 DIAGNOSIS — I82409 Acute embolism and thrombosis of unspecified deep veins of unspecified lower extremity: Secondary | ICD-10-CM | POA: Diagnosis not present

## 2013-10-11 DIAGNOSIS — C189 Malignant neoplasm of colon, unspecified: Secondary | ICD-10-CM

## 2013-10-11 DIAGNOSIS — I619 Nontraumatic intracerebral hemorrhage, unspecified: Secondary | ICD-10-CM | POA: Diagnosis present

## 2013-10-11 DIAGNOSIS — R627 Adult failure to thrive: Secondary | ICD-10-CM | POA: Diagnosis present

## 2013-10-11 DIAGNOSIS — F17200 Nicotine dependence, unspecified, uncomplicated: Secondary | ICD-10-CM | POA: Diagnosis present

## 2013-10-11 DIAGNOSIS — Z8546 Personal history of malignant neoplasm of prostate: Secondary | ICD-10-CM

## 2013-10-11 DIAGNOSIS — I259 Chronic ischemic heart disease, unspecified: Secondary | ICD-10-CM

## 2013-10-11 DIAGNOSIS — G9340 Encephalopathy, unspecified: Secondary | ICD-10-CM | POA: Diagnosis present

## 2013-10-11 DIAGNOSIS — E43 Unspecified severe protein-calorie malnutrition: Secondary | ICD-10-CM

## 2013-10-11 DIAGNOSIS — D638 Anemia in other chronic diseases classified elsewhere: Secondary | ICD-10-CM | POA: Diagnosis present

## 2013-10-11 DIAGNOSIS — J690 Pneumonitis due to inhalation of food and vomit: Secondary | ICD-10-CM

## 2013-10-11 DIAGNOSIS — I959 Hypotension, unspecified: Secondary | ICD-10-CM | POA: Diagnosis not present

## 2013-10-11 HISTORY — DX: Acute myocardial infarction, unspecified: I21.9

## 2013-10-11 HISTORY — DX: Pneumonia, unspecified organism: J18.9

## 2013-10-11 HISTORY — DX: Malignant neoplasm of prostate: C61

## 2013-10-11 HISTORY — DX: Unspecified osteoarthritis, unspecified site: M19.90

## 2013-10-11 HISTORY — DX: Chronic kidney disease, unspecified: N18.9

## 2013-10-11 HISTORY — DX: Cerebral infarction, unspecified: I63.9

## 2013-10-11 LAB — COMPREHENSIVE METABOLIC PANEL
ALT: 14 U/L (ref 0–53)
AST: 27 U/L (ref 0–37)
Albumin: 1.6 g/dL — ABNORMAL LOW (ref 3.5–5.2)
Alkaline Phosphatase: 52 U/L (ref 39–117)
BUN: 24 mg/dL — ABNORMAL HIGH (ref 6–23)
CO2: 23 mEq/L (ref 19–32)
Chloride: 114 mEq/L — ABNORMAL HIGH (ref 96–112)
Total Protein: 7.2 g/dL (ref 6.0–8.3)

## 2013-10-11 LAB — URINE MICROSCOPIC-ADD ON

## 2013-10-11 LAB — CREATININE, SERUM
GFR calc Af Amer: >90 mL/min (ref >90)
GFR calc non Af Amer: 83 mL/min — ABNORMAL LOW (ref >90)

## 2013-10-11 LAB — GLUCOSE, CAPILLARY
Glucose-Capillary: 61 mg/dL — ABNORMAL LOW (ref 70–99)
Glucose-Capillary: 123 mg/dL — ABNORMAL HIGH (ref 70–99)
Glucose-Capillary: 121 mg/dL — ABNORMAL HIGH (ref 70–99)

## 2013-10-11 LAB — CBC WITH DIFFERENTIAL/PLATELET
Eosinophils Relative: 0 % (ref 0–5)
HCT: 33.1 % — ABNORMAL LOW (ref 39.0–52.0)
Hemoglobin: 10.8 g/dL — ABNORMAL LOW (ref 13.0–17.0)
Lymphocytes Relative: 8 % — ABNORMAL LOW (ref 12–46)
MCHC: 32.6 g/dL (ref 30.0–36.0)
MCV: 90.4 fL (ref 78.0–100.0)
Monocytes Absolute: 1.3 10*3/uL — ABNORMAL HIGH (ref 0.1–1.0)
Monocytes Relative: 8 % (ref 3–12)
Neutro Abs: 13.8 10*3/uL — ABNORMAL HIGH (ref 1.7–7.7)
RDW: 15.9 % — ABNORMAL HIGH (ref 11.5–15.5)
WBC: 16.4 10*3/uL — ABNORMAL HIGH (ref 4.0–10.5)

## 2013-10-11 LAB — APTT: aPTT: 32 seconds (ref 24–37)

## 2013-10-11 LAB — RAPID URINE DRUG SCREEN, HOSP PERFORMED
Amphetamines: NOT DETECTED
Cocaine: NOT DETECTED
Opiates: NOT DETECTED

## 2013-10-11 LAB — CBC
HCT: 34.4 % — ABNORMAL LOW (ref 39.0–52.0)
MCH: 29.1 pg (ref 26.0–34.0)
MCHC: 31.7 g/dL (ref 30.0–36.0)
MCV: 92 fL (ref 78.0–100.0)
RBC: 3.74 MIL/uL — ABNORMAL LOW (ref 4.22–5.81)
RDW: 16 % — ABNORMAL HIGH (ref 11.5–15.5)

## 2013-10-11 LAB — VALPROIC ACID LEVEL: Valproic Acid Lvl: 59.4 ug/mL (ref 50.0–100.0)

## 2013-10-11 LAB — POCT I-STAT TROPONIN I: Troponin i, poc: 0 ng/mL (ref 0.00–0.08)

## 2013-10-11 LAB — URINALYSIS, ROUTINE W REFLEX MICROSCOPIC
Glucose, UA: 250 mg/dL — AB
Hgb urine dipstick: NEGATIVE
Specific Gravity, Urine: 1.028 (ref 1.005–1.030)
pH: 6 (ref 5.0–8.0)

## 2013-10-11 LAB — PROTIME-INR
INR: 1.39 (ref 0.00–1.49)
Prothrombin Time: 16.7 seconds — ABNORMAL HIGH (ref 11.6–15.2)

## 2013-10-11 MED ORDER — METOPROLOL TARTRATE 25 MG PO TABS
25.0000 mg | ORAL_TABLET | Freq: Two times a day (BID) | ORAL | Status: DC
  Administered 2013-10-11 – 2013-10-12 (×3): 25 mg via ORAL
  Filled 2013-10-11 (×5): qty 1

## 2013-10-11 MED ORDER — SODIUM CHLORIDE 0.9 % IJ SOLN
3.0000 mL | Freq: Two times a day (BID) | INTRAMUSCULAR | Status: DC
  Administered 2013-10-13 – 2013-10-14 (×3): 3 mL via INTRAVENOUS
  Administered 2013-10-15: 10:00:00 via INTRAVENOUS
  Administered 2013-10-15: 3 mL via INTRAVENOUS
  Administered 2013-10-16: 6 mL via INTRAVENOUS
  Administered 2013-10-16 – 2013-10-21 (×7): 3 mL via INTRAVENOUS

## 2013-10-11 MED ORDER — ENOXAPARIN SODIUM 40 MG/0.4ML ~~LOC~~ SOLN
40.0000 mg | SUBCUTANEOUS | Status: DC
  Administered 2013-10-11: 23:00:00 40 mg via SUBCUTANEOUS
  Filled 2013-10-11 (×2): qty 0.4

## 2013-10-11 MED ORDER — OXYCODONE HCL 5 MG PO TABS: 5.0000 mg | ORAL_TABLET | ORAL | Status: DC | PRN

## 2013-10-11 MED ORDER — VANCOMYCIN HCL IN DEXTROSE 750-5 MG/150ML-% IV SOLN
750.0000 mg | Freq: Two times a day (BID) | INTRAVENOUS | Status: DC
  Administered 2013-10-12 – 2013-10-13 (×4): 750 mg via INTRAVENOUS
  Filled 2013-10-11 (×8): qty 150

## 2013-10-11 MED ORDER — PIPERACILLIN-TAZOBACTAM 3.375 G IVPB
3.3750 g | Freq: Three times a day (TID) | INTRAVENOUS | Status: DC
  Administered 2013-10-12 – 2013-10-14 (×7): 3.375 g via INTRAVENOUS
  Filled 2013-10-11 (×12): qty 50

## 2013-10-11 MED ORDER — DIVALPROEX SODIUM 125 MG PO CPSP
750.0000 mg | ORAL_CAPSULE | Freq: Two times a day (BID) | ORAL | Status: DC
  Administered 2013-10-11 – 2013-10-14 (×6): 750 mg via ORAL
  Filled 2013-10-11 (×7): qty 6

## 2013-10-11 MED ORDER — VANCOMYCIN HCL 1000 MG IV SOLR: 15.0000 mg/kg | Freq: Once | INTRAVENOUS | Status: DC

## 2013-10-11 MED ORDER — DEXTROSE 5 % IV BOLUS: 250.0000 mL | Freq: Once | INTRAVENOUS | Status: DC

## 2013-10-11 MED ORDER — PANTOPRAZOLE SODIUM 40 MG PO TBEC
40.0000 mg | DELAYED_RELEASE_TABLET | Freq: Every day | ORAL | Status: DC
  Administered 2013-10-11 – 2013-10-14 (×4): 40 mg via ORAL
  Filled 2013-10-11 (×4): qty 1

## 2013-10-11 MED ORDER — ACETAMINOPHEN 325 MG PO TABS
650.0000 mg | ORAL_TABLET | Freq: Four times a day (QID) | ORAL | Status: DC | PRN
  Administered 2013-10-12: 650 mg via ORAL
  Filled 2013-10-11: qty 2

## 2013-10-11 MED ORDER — SODIUM CHLORIDE 0.9 % IV SOLN
INTRAVENOUS | Status: DC
  Administered 2013-10-11: 23:00:00 via INTRAVENOUS

## 2013-10-11 MED ORDER — DEXTROSE 50 % IV SOLN
50.0000 mL | Freq: Once | INTRAVENOUS | Status: AC
  Administered 2013-10-11: 50 mL via INTRAVENOUS
  Filled 2013-10-11: qty 50

## 2013-10-11 MED ORDER — ALUM & MAG HYDROXIDE-SIMETH 200-200-20 MG/5ML PO SUSP: 30.0000 mL | Freq: Four times a day (QID) | ORAL | Status: DC | PRN

## 2013-10-11 MED ORDER — VANCOMYCIN HCL IN DEXTROSE 1-5 GM/200ML-% IV SOLN
1000.0000 mg | Freq: Once | INTRAVENOUS | Status: AC
  Administered 2013-10-11: 1000 mg via INTRAVENOUS

## 2013-10-11 MED ORDER — AMLODIPINE BESYLATE 5 MG PO TABS
5.0000 mg | ORAL_TABLET | Freq: Every day | ORAL | Status: DC
  Administered 2013-10-11: 5 mg via ORAL
  Filled 2013-10-11 (×2): qty 1

## 2013-10-11 MED ORDER — ONDANSETRON HCL 4 MG/2ML IJ SOLN: 4.0000 mg | Freq: Four times a day (QID) | INTRAMUSCULAR | Status: DC | PRN

## 2013-10-11 MED ORDER — ONDANSETRON HCL 4 MG PO TABS: 4.0000 mg | ORAL_TABLET | Freq: Four times a day (QID) | ORAL | Status: DC | PRN

## 2013-10-11 MED ORDER — DEXTROSE-NACL 5-0.9 % IV SOLN
Freq: Once | INTRAVENOUS | Status: AC
  Administered 2013-10-11: 18:00:00 via INTRAVENOUS

## 2013-10-11 MED ORDER — CIPROFLOXACIN IN D5W 400 MG/200ML IV SOLN
400.0000 mg | Freq: Once | INTRAVENOUS | Status: DC
  Administered 2013-10-11: 400 mg via INTRAVENOUS
  Filled 2013-10-11: qty 200

## 2013-10-11 MED ORDER — GUAIFENESIN ER 600 MG PO TB12
600.0000 mg | ORAL_TABLET | Freq: Two times a day (BID) | ORAL | Status: DC
  Administered 2013-10-11 – 2013-10-14 (×6): 600 mg via ORAL
  Filled 2013-10-11 (×8): qty 1

## 2013-10-11 MED ORDER — ASPIRIN 81 MG PO CHEW
81.0000 mg | CHEWABLE_TABLET | Freq: Every day | ORAL | Status: DC
  Administered 2013-10-11 – 2013-10-14 (×4): 81 mg via ORAL
  Filled 2013-10-11 (×4): qty 1

## 2013-10-11 MED ORDER — SORBITOL 70 % SOLN
30.0000 mL | Freq: Every day | Status: DC | PRN
  Filled 2013-10-11: qty 30

## 2013-10-11 MED ORDER — SODIUM CHLORIDE 0.9 % IV BOLUS (SEPSIS)
1000.0000 mL | Freq: Once | INTRAVENOUS | Status: AC
  Administered 2013-10-11: 1000 mL via INTRAVENOUS

## 2013-10-11 MED ORDER — ACETAMINOPHEN 650 MG RE SUPP: 650.0000 mg | Freq: Four times a day (QID) | RECTAL | Status: DC | PRN

## 2013-10-11 MED ORDER — PIPERACILLIN-TAZOBACTAM 3.375 G IVPB 30 MIN
3.3750 g | Freq: Once | INTRAVENOUS | Status: AC
  Administered 2013-10-11: 3.375 g via INTRAVENOUS
  Filled 2013-10-11: qty 50

## 2013-10-11 NOTE — H&P (Signed)
[facility] History and Physical  Francisco Johnston UXL:[id number] DOB: July 15, 1934 DOA: 10/11/2013  Referring physician:  PCP: [name], MD  Specialists:   Chief Complaint: Functional decline/Failure to thrive  HPI: Francisco Johnston is a 79 y.o. male with a past medical history of left frontal hemorrhagic CVA, discharged from the stroke service on 08/06/2013 to skilled nursing facility, was transferred to the emergent apartment from his SNF this evening with complaints of overall functional decline/failure to thrive. History was obtained from the patient's son who was present at bedside. Francisco Johnston has become increasingly lethargic, having excessive daytime sleepiness, minimal by mouth intake, having an overall steep decline in the last 4 days. His son notes that he has had chills as well as fever, temperature of 101 at his nursing home. There have been no reports of cough, shortness of breath, sputum production, or chest pain. initial lab work in the emergency department showed a white count of 16,400 with a chest x-ray showing left upper lobe pneumonia. He was administered IV vancomycin and Zosyn for treatment of healthcare associated pneumonia. CT scan of brain without contrast did not show evidence of acute intracranial changes.                                                                                                                                           Review of Systems: The patient denies anorexia, fever, weight loss,, vision loss, decreased hearing, hoarseness, chest pain, syncope, dyspnea on exertion, peripheral edema, balance deficits, hemoptysis, abdominal pain, melena, hematochezia, severe indigestion/heartburn, hematuria, incontinence, genital sores, muscle weakness, suspicious skin lesions, transient blindness, difficulty walking, depression, unusual weight change, abnormal bleeding, enlarged lymph nodes, angioedema, and breast masses.    Past Medical History   Diagnosis Date  . Heart disease   . Hypertension   . Cancer     colon  . Adenocarcinoma of colon 06/12/2011  . Iron deficiency anemia 06/12/2011  . CAD (coronary artery disease)   . Noncompliance   . ED (erectile dysfunction)   . IFG (impaired fasting glucose)   . Lacunar stroke    Past Surgical History  Procedure Laterality Date  . Colectomy    . Colonoscopy     Social History:  reports that he has never smoked. His smokeless tobacco use includes Snuff. He reports that he drinks alcohol. He reports that he does not use illicit drugs.   No Known Allergies  Family History  Problem Relation Age of Onset  . Cancer Father   . Cancer Sister     Prior to Admission medications   Medication Sig Start Date End Date Taking? Authorizing Provider  amLODipine (NORVASC) 5 MG tablet Take 1 tablet (5 mg total) by mouth daily. 08/15/13  Yes Cathlyn Parsons, PA-C  aspirin 81 MG chewable tablet Chew 1 tablet (81 mg total) by mouth daily. 08/16/13  Yes Gwendolyn Lima  Manson Passey, PA-C  divalproex (DEPAKOTE SPRINKLE) 125 MG capsule Take 750 mg by mouth 2 (two) times daily.   Yes Historical Provider, MD  guaiFENesin (MUCINEX) 600 MG 12 hr tablet Take 600 mg by mouth 2 (two) times daily. 10/11/13  Yes Historical Provider, MD  metoprolol tartrate (LOPRESSOR) 25 MG tablet Take 1 tablet (25 mg total) by mouth 2 (two) times daily. 08/15/13  Yes Cathlyn Parsons, PA-C  pantoprazole (PROTONIX) 40 MG tablet Take 40 mg by mouth daily.   Yes Historical Provider, MD  potassium chloride SA (K-DUR,KLOR-CON) 20 MEQ tablet Take 2 tablets (40 mEq total) by mouth daily. 08/15/13  Yes Cathlyn Parsons, PA-C   Physical Exam: Filed Vitals:   10/11/13 1835  BP: 139/80  Pulse: 89  Temp:   Resp: 17     General:  Chronically ill-appearing, cachectic, though no acute distress  Eyes: Pupils are equal round and reactive, extraocular movement appears to be intact. No sclera icterus dry oral mucosa  Neck: Patient having flat jugular veins,  otherwise neck is symmetrical, supple  Cardiovascular: Regular rate and rhythm normal S1-S2 no murmurs rubs or gallops or extremity edema  Respiratory: Normal respiratory effort, as by basilar crackles, no wheezes or rhonchi were auscultated  Abdomen: Soft nontender nondistended positive bowel some  Skin: No rashes or lesions  Musculoskeletal: No edema to lower extremities, muscle atrophy noted bilaterally.  Psychiatric: Patient is following commands however minimally verbal.  Neurologic: He has flaccid paralysis 2 right upper and right lower extremity, per his son this is at his baseline. 5 of 5 muscle strength to left upper and left lower extremity, no alteration to sensation. No facial droop noted.  Labs on Admission:  Basic Metabolic Panel:  Recent Labs Lab 10/11/13 1700  NA 147*  K 4.4  CL 114*  CO2 23  GLUCOSE 179*  BUN 24*  CREATININE 0.75  CALCIUM 7.9*   Liver Function Tests:  Recent Labs Lab 10/11/13 1700  AST 27  ALT 14  ALKPHOS 52  BILITOT 0.6  PROT 7.2  ALBUMIN 1.6*   No results found for this basename: LIPASE, AMYLASE,  in the last 168 hours No results found for this basename: AMMONIA,  in the last 168 hours CBC:  Recent Labs Lab 10/11/13 1700  WBC 16.4*  NEUTROABS 13.8*  HGB 10.8*  HCT 33.1*  MCV 90.4  PLT 175   Cardiac Enzymes:  Recent Labs Lab 10/11/13 1700  TROPONINI <0.30    BNP (last 3 results) No results found for this basename: PROBNP,  in the last 8760 hours CBG:  Recent Labs Lab 10/11/13 1510 10/11/13 1729  GLUCAP 61* 123*    Radiological Exams on Admission: Dg Chest 2 View  10/11/2013   CLINICAL DATA:  Altered mental status.  EXAM: CHEST  2 VIEW  COMPARISON:  08/08/2013.  FINDINGS: There is left upper lobe airspace disease. There is no pleural effusion or pneumothorax. The heart size is normal. The osseous structures are unremarkable.  IMPRESSION: Left upper lobe pneumonia. Recommend followup radiography in 4-6  weeks, to document complete resolution following adequate medical therapy. If there is not complete resolution, then recommend further evaluation with CT of the chest to exclude underlying pathology.   Electronically Signed   By: Elige Ko   On: 10/11/2013 15:06   Ct Head Wo Contrast  10/11/2013   CLINICAL DATA:  Slurred speech. Altered mental status.  EXAM: CT HEAD WITHOUT CONTRAST  TECHNIQUE: Contiguous axial images were obtained from  the base of the skull through the vertex without intravenous contrast.  COMPARISON:  CT 08/08/2013  FINDINGS: Acute hemorrhage in the left parietal white matter on the prior study has resolved. There is a 2 cm hypodensity in this area. No acute hemorrhage.  Moderate to advanced atrophy. Negative for hydrocephalus. Chronic microvascular ischemic change in the white matter. Negative for acute infarct or mass.  IMPRESSION: Left parietal white matter hypodensity consistent with resolving hematoma compared with the prior study.  Extensive atrophy which has progressed from the prior study. No acute infarct or hemorrhage.   Electronically Signed   By: Marlan Palau M.D.   On: 10/11/2013 16:40    EKG: Independently reviewed. EKG showing sinus tachycardia   Assessment/Plan Active Problems:   HTN (hypertension)   CVA (cerebral vascular accident)   Convulsions/seizures   Hemiparesis affecting right side as late effect of cerebrovascular accident   HCAP (healthcare-associated pneumonia)   Encephalopathy acute   Colon cancer   1. Failure to thrive/functional decline likely secondary to acute encephalopathy in setting of underlying infectious process. Patient having ST functional decline over the last 4 days. CT scan of brain without contrast show no evidence of acute intracranial abnormality. Initial lab work showed leukocytosis of 16,000 with a chest x-ray showing evidence of pneumonia. Started on spectrum empiric IV antibiotic therapy, supportive care, admit to telemetry,  consult PT.  2. Healthcare associated pneumonia. Patient reportedly have a temperature 101 at his nursing home, developing chills, with lab work showing white count in the 16,000 range, and chest x-ray showing infiltrate. Will start IV vancomycin and Zosyn, consult pharmacy for dosing. Obtain blood cultures. 3. History of hemorrhagic CVA. We'll continue aspirin therapy at 81 mg by mouth daily. As I noted above, CT scan showing no acute intracranial event. His son reports that she is at his baseline neurological deficits from previous stroke. His mental status changes likely secondary to underlying infectious process, will monitor closely. 4. Hypertension. Patient presented with a blood pressure of 139/80. Will continue metoprolol tartrate 35 mg twice a day and amlodipine 5 mg by mouth daily.  5. History of seizures post CVA. Valproic acid level within therapeutic range, we'll continue his home dose of valproic acid at 750 mg by mouth twice a day. 6. DVT prophylaxis. Lovenox 40 mg subcutaneous daily    Code Status: I reviewed his status with his son present at bedside, patient is a Full Code Family Communication: Plan discussed with his son present at bedside Disposition Plan: Anticipate patient will require greater than 2 night hospitalization  Time spent: 70 minutes  Jeralyn Bennett [facility] Pager [phone]  If 7PM-7AM, please contact night-coverage [email or web address] Password [facility] 10/11/2013, 6:51 PM

## 2013-10-11 NOTE — ED Provider Notes (Signed)
  Physical Exam  BP 121/72  Pulse 92  Temp(Src) 97.9 F (36.6 C) (Oral)  SpO2 94%  Physical Exam  ED Course  Procedures  MDM Assessment care from [facility] PA-C at 1600 please see her note for history of present illness details of Until that point. Briefly this is a 79 year old male with past medical history of stroke. His recent discharge from the hospital and has been in rehabilitation since then. He comes emergency department today with altered mental status since this morning. Initial workup has revealed a pneumonia. She has been treated with antibiotics for health care associated pneumonia. Awaiting results of his labs. Labs demonstrated normal glucose. Valproic acid level LIX.4. A PTT of 32. PT/INR with an INR of 1.39. Troponin was negative. CMP sodium 147 otherwise unremarkable. CBC with WBC of 16.4 otherwise unremarkable.  He was felt to require admission to the hospital for altered mental status and pneumonia. The patient was admitted to the hospitalist service in stable condition. Labs reviewed by myself and considered and medical decision-making. Care was discussed with my attending Dr. Jodi Mourning.    Bethann Berkshire, MD 10/12/13 0005

## 2013-10-11 NOTE — ED Provider Notes (Signed)
CSN: [id number]     Arrival date & time 10/11/13  1403 History   First MD Initiated Contact with Patient 10/11/13 1406     Chief Complaint  Patient presents with  . Altered Mental Status   (Consider location/radiation/quality/duration/timing/severity/associated sxs/prior Treatment) The history is provided by a relative. No language interpreter was used.  Francisco Johnston is a 79 y/o M with PMHx of heart disease, HTN, adenocarcinoma of colon, iron deficiency, CAD, ED, lacunar stroke. Patient not eating or drinking for the past 2 days. As per EMS report, patient has been drooling. As per patient's son - patient was last seen normal on 10/06/2013. CBG upon arrival to the ED was 193. Son denied falls, head injury. Patient denied chest pain, shortness of breath, difficulty breathing. PCP Dr. Fletcher Anon  Past Medical History  Diagnosis Date  . Heart disease   . Hypertension   . Cancer     colon  . Adenocarcinoma of colon 06/12/2011  . Iron deficiency anemia 06/12/2011  . CAD (coronary artery disease)   . Noncompliance   . ED (erectile dysfunction)   . IFG (impaired fasting glucose)   . Lacunar stroke    Past Surgical History  Procedure Laterality Date  . Colectomy    . Colonoscopy     Family History  Problem Relation Age of Onset  . Cancer Father   . Cancer Sister    History  Substance Use Topics  . Smoking status: Never Smoker   . Smokeless tobacco: Current User    Types: Snuff  . Alcohol Use: Yes     Comment: occ    Review of Systems  Unable to perform ROS: Mental status change    Allergies  Review of patient's allergies indicates no known allergies.  Home Medications   Current Outpatient Rx  Name  Route  Sig  Dispense  Refill  . amLODipine (NORVASC) 5 MG tablet   Oral   Take 1 tablet (5 mg total) by mouth daily.   14 tablet   0   . aspirin 81 MG chewable tablet   Oral   Chew 1 tablet (81 mg total) by mouth daily.   14 tablet   0   . divalproex (DEPAKOTE  SPRINKLE) 125 MG capsule   Oral   Take 750 mg by mouth 2 (two) times daily.         Marland Kitchen guaiFENesin (MUCINEX) 600 MG 12 hr tablet   Oral   Take 600 mg by mouth 2 (two) times daily.         . metoprolol tartrate (LOPRESSOR) 25 MG tablet   Oral   Take 1 tablet (25 mg total) by mouth 2 (two) times daily.   30 tablet   0   . pantoprazole (PROTONIX) 40 MG tablet   Oral   Take 40 mg by mouth daily.         . potassium chloride SA (K-DUR,KLOR-CON) 20 MEQ tablet   Oral   Take 2 tablets (40 mEq total) by mouth daily.   30 tablet   0    BP 147/61  Pulse 93  Temp(Src) 97.9 F (36.6 C) (Oral)  Resp 20  SpO2 96% Physical Exam  Nursing note and vitals reviewed. Constitutional: He appears well-developed and well-nourished. No distress.  HENT:  Head: Normocephalic and atraumatic.  Mouth/Throat: No oropharyngeal exudate.  Dry mucus membranes  Eyes: Conjunctivae and EOM are normal. Pupils are equal, round, and reactive to light. Right eye exhibits no  discharge. Left eye exhibits no discharge.  Cataracts in left eye  Neck: Normal range of motion. Neck supple.  Negative neck stiffness Negative nuchal rigidity Negative cervical LAD  Cardiovascular: Normal rate, regular rhythm and normal heart sounds.  Exam reveals no friction rub.   No murmur heard. Pulmonary/Chest: Effort normal. He has no wheezes. He has no rales.  Decreased breath sounds to upper and lower lobes bilaterally   Musculoskeletal:  Decreased ROM to the right upper extremity - secondary to stroke that patient experienced in August 2014  Lymphadenopathy:    He has no cervical adenopathy.  Neurological: He is alert. He exhibits normal muscle tone. Coordination normal.  Flaccid right upper and lower extremity secondary to stroke that patient presented with on 07/2013.  When call patient's name is called he turns his head in the proper direction  Follows commands  Speech mumbled Cranial nerves III-XII moderately  intact   Skin: Skin is warm and dry. No rash noted. He is not diaphoretic. No erythema.    ED Course  Procedures (including critical care time)  This provider reviewed the patient's chart - patient was seen and admitted to the hospital on 08/06/2013 for hemmorhagic stroke in left posterior aspect of the frontal lobe. Stroke resulted in right sided hemiparesis - flaccidity to the right side - difficulty with speech. Patient currently resides at [facility] and [facility].   3:36 PM This provider spoke with [facility] and [facility] - spoke with patient's LPN - Levander Campion. As per LPN, patient was last seen normal at 11:00AM, As per LPN, reported that patient would not eat his lunch and reported that patient was having difficulty swallowing. Reported that patient would not follow commands like he normally does. Reported that patient was seen being a little bit more confused after 11:00Am than normal. LPN reported that patient normally has unclear speech and flaccidity to the right side secondary to stroke that occurred in 07/2013.   3:50 PM Patient seen and assessed by Dr. Abran Duke.  Patient on dysphagia diet in nursing home, automatic failure to swallow screen. Patient kept NPO.    Date: 10/11/2013  Rate: 113  Rhythm: sinus tachycardia  QRS Axis: normal  Intervals: normal  ST/T Wave abnormalities: normal  Conduction Disutrbances:none  Narrative Interpretation: Paired PVCs noted  Old EKG Reviewed: unchanged EKG analyzed and reviewed by this provider and attending physician.   Labs Review Labs Reviewed  CBC WITH DIFFERENTIAL - Abnormal; Notable for the following:    WBC 16.4 (*)    RBC 3.66 (*)    Hemoglobin 10.8 (*)    HCT 33.1 (*)    RDW 15.9 (*)    Neutrophils Relative % 84 (*)    Neutro Abs 13.8 (*)    Lymphocytes Relative 8 (*)    Monocytes Absolute 1.3 (*)    All other components within normal limits  COMPREHENSIVE METABOLIC  PANEL - Abnormal; Notable for the following:    Sodium 147 (*)    Chloride 114 (*)    Glucose, Bld 179 (*)    BUN 24 (*)    Calcium 7.9 (*)    Albumin 1.6 (*)    GFR calc non Af Amer 87 (*)    All other components within normal limits  PROTIME-INR - Abnormal; Notable for the following:    Prothrombin Time 16.7 (*)    All other components within normal limits  GLUCOSE, CAPILLARY - Abnormal; Notable for the following:  Glucose-Capillary 61 (*)    All other components within normal limits  GLUCOSE, CAPILLARY - Abnormal; Notable for the following:    Glucose-Capillary 123 (*)    All other components within normal limits  TROPONIN I  APTT  VALPROIC ACID LEVEL  URINALYSIS, ROUTINE W REFLEX MICROSCOPIC  URINE RAPID DRUG SCREEN (HOSP PERFORMED)  POCT I-STAT TROPONIN I   Imaging Review Dg Chest 2 View  10/11/2013   CLINICAL DATA:  Altered mental status.  EXAM: CHEST  2 VIEW  COMPARISON:  08/08/2013.  FINDINGS: There is left upper lobe airspace disease. There is no pleural effusion or pneumothorax. The heart size is normal. The osseous structures are unremarkable.  IMPRESSION: Left upper lobe pneumonia. Recommend followup radiography in 4-6 weeks, to document complete resolution following adequate medical therapy. If there is not complete resolution, then recommend further evaluation with CT of the chest to exclude underlying pathology.   Electronically Signed   By: Elige Ko   On: 10/11/2013 15:06   Ct Head Wo Contrast  10/11/2013   CLINICAL DATA:  Slurred speech. Altered mental status.  EXAM: CT HEAD WITHOUT CONTRAST  TECHNIQUE: Contiguous axial images were obtained from the base of the skull through the vertex without intravenous contrast.  COMPARISON:  CT 08/08/2013  FINDINGS: Acute hemorrhage in the left parietal white matter on the prior study has resolved. There is a 2 cm hypodensity in this area. No acute hemorrhage.  Moderate to advanced atrophy. Negative for hydrocephalus. Chronic  microvascular ischemic change in the white matter. Negative for acute infarct or mass.  IMPRESSION: Left parietal white matter hypodensity consistent with resolving hematoma compared with the prior study.  Extensive atrophy which has progressed from the prior study. No acute infarct or hemorrhage.   Electronically Signed   By: Marlan Palau M.D.   On: 10/11/2013 16:40    EKG Interpretation     Ventricular Rate:  113 PR Interval:  131 QRS Duration: 73 QT Interval:  335 QTC Calculation: 459 R Axis:   2 Text Interpretation:  Sinus tachycardia Paired ventricular premature complexes Probable anteroseptal infarct, old            MDM  No diagnosis found.  Patient presenting to the ED with AMS - as per nursing home - patient was last seen normal at 11:00AM this morning. Nurse at home reported that patient has not been eating, reported that he does not follow commands as well as he normally should.  Alert - when call patient's name he looks in the proper direction. Cranial nerves moderately intact. Heart rate and rhythm normal. Lungs with decreased breath sounds bilaterally to upper and lower lobes. Flaccidity noted to the right side secondary to stroke that occurred back in 07/2013. Dry mucus membranes. Pulses palpable and strong, radial and DP bilaterally.  EKG noted sinus tachycardia with paired PVCs - negative elevation in troponin. CBC noted elevated WBC of 16.4 with mild leukocytosis noted. CMP noted hypernatremia of 147, elevated BUN of 24, hypocalcemia of 7.9. Glucose 179. APTT of 32. INR of 1.39. PT 16.7. CT scan of head without contrast noted left parietal white matter hypodensity consistent with resolving hematoma compared with prior study - no acute infarct or hemorrhage. Chest xray left upper lobe pneumonia.  Patient placed on IV fluids. Patient started on antibiotics via IV for HCAP. Patient placed on CBG monitoring every hour - patient has been placed on dextrose IV infusion since  patient is NPO. Urine pending.  Patient to be  admitted to the hospital for HCAP, AMS, and risk factors. Discussed case with resident Dr. Erven Colla - discussed that patient is to be monitored and admitted to the hospital. Transfer of care to Dr. Erven Colla at change in shift.       Raymon Mutton, PA-C 10/11/13 [phone]

## 2013-10-11 NOTE — ED Notes (Signed)
Per EMS pt from [facility] with c/o altered mental status. Pt responsive to pain. Not responding to verbal. 20G LAC. BG 193. Right side flaccid per previous stroke. VSS. Not eating or drinking for two days, drooling.

## 2013-10-11 NOTE — Progress Notes (Signed)
ANTIBIOTIC CONSULT NOTE - INITIAL  Pharmacy Consult for Vancomycin/Zosyn Indication: r/o HCAP  No Known Allergies  Patient Measurements: Height: 6\' 2"  (188 cm) Weight: 129 lb 13.6 oz (58.9 kg) IBW/kg (Calculated) : 82.2  Vital Signs: Temp: 99 F (37.2 C) (11/05 2005) Temp src: Oral (11/05 2005) BP: 140/68 mmHg (11/05 2005) Pulse Rate: 112 (11/05 2005)  Labs:  Recent Labs  10/11/13 1700  WBC 16.4*  HGB 10.8*  PLT 175  CREATININE 0.75   Estimated Creatinine Clearance: 65.4 ml/min (by C-G formula based on Cr of 0.75).  Medical History: Past Medical History  Diagnosis Date  . Heart disease   . Hypertension   . Iron deficiency anemia 06/12/2011  . CAD (coronary artery disease)   . Noncompliance   . ED (erectile dysfunction)   . IFG (impaired fasting glucose)   . Lacunar stroke   . Myocardial infarction 1980's X2  . Prostate cancer 1990's    "took radiation tx for that" (10/11/2013)  . Adenocarcinoma of colon 06/12/2011  . Pneumonia     "maybe now, never before" (10/11/2013)  . Stroke 08/06/2013    "paralyzed on the right side since" (10/11/2013)  . Arthritis     "probably; joints" (10/11/2013)  . Chronic kidney disease     "one kidney's smaller than the other; been like that forever" (10/11/2013)   Medications:  Anti-infectives   Start     Dose/Rate Route Frequency Ordered Stop   10/11/13 1745  vancomycin (VANCOCIN) IVPB 1000 mg/200 mL premix     1,000 mg 200 mL/hr over 60 Minutes Intravenous  Once 10/11/13 1736 10/11/13 1902   10/11/13 1715  ciprofloxacin (CIPRO) IVPB 400 mg  Status:  Discontinued     400 mg 200 mL/hr over 60 Minutes Intravenous  Once 10/11/13 1700 10/11/13 2002   10/11/13 1715  vancomycin (VANCOCIN) 15 mg/kg in sodium chloride 0.9 % 100 mL IVPB  Status:  Discontinued     15 mg/kg 100 mL/hr over 60 Minutes Intravenous  Once 10/11/13 1700 10/11/13 1735   10/11/13 1715  piperacillin-tazobactam (ZOSYN) IVPB 3.375 g     3.375 g 100 mL/hr over 30  Minutes Intravenous  Once 10/11/13 1700 10/11/13 1803     Assessment: 78yo male admitted with lethargy, minimal intake and failure to thrive.  His family states he has also had fever and chills.  He has no known drug allergies.  His creatinine is  0.75 and he has an estimated clearance of 72ml/min.  We have been asked to dose his IV antibiotics.  He received IV Vancomycin 1gm, Zosyn 3.375 gm around 5PM this evening.  He is < IBW at ~ 59kg.  Goal of Therapy:  Vancomycin trough level 15-20 mcg/ml Therapeutic response to IV antibiotics  Plan:  Vancomycin 750 mg IV every 12 hours Zosyn 3.375 gm IV every 8 hours Monitor renal function, culture data and response  Nadara Mustard, PharmD., MS Clinical Pharmacist Pager:  [phone] Thank you for allowing pharmacy to be part of this patients care team. 10/11/2013,8:21 PM

## 2013-10-11 NOTE — ED Notes (Signed)
Attempt by RN to draw labs, unable. Phlebotomy made aware of need for labs.

## 2013-10-11 NOTE — ED Notes (Signed)
Phlebotomy made aware again for 2nd time, need for labs.

## 2013-10-11 NOTE — Progress Notes (Signed)
Pt admitted to unit from ED. Pt is alert only to self & skin intact. Pt is currently resting comfortably in bed with family at bedside. Bed alarm on and call bell within reach. Will continue to monitor.

## 2013-10-12 DIAGNOSIS — E43 Unspecified severe protein-calorie malnutrition: Secondary | ICD-10-CM | POA: Diagnosis present

## 2013-10-12 DIAGNOSIS — I619 Nontraumatic intracerebral hemorrhage, unspecified: Secondary | ICD-10-CM

## 2013-10-12 DIAGNOSIS — I69959 Hemiplegia and hemiparesis following unspecified cerebrovascular disease affecting unspecified side: Secondary | ICD-10-CM

## 2013-10-12 LAB — BASIC METABOLIC PANEL
BUN: 20 mg/dL (ref 6–23)
Calcium: 8.4 mg/dL (ref 8.4–10.5)
GFR calc Af Amer: >90 mL/min (ref >90)
GFR calc non Af Amer: 85 mL/min — ABNORMAL LOW (ref >90)
Potassium: 4.2 mEq/L (ref 3.5–5.1)
Sodium: 153 mEq/L — ABNORMAL HIGH (ref 135–145)

## 2013-10-12 LAB — TROPONIN I
Troponin I: <0.3 ng/mL (ref <0.30)
Troponin I: <0.3 ng/mL (ref <0.30)

## 2013-10-12 LAB — CBC
HCT: 35.4 % — ABNORMAL LOW (ref 39.0–52.0)
Hemoglobin: 11.4 g/dL — ABNORMAL LOW (ref 13.0–17.0)
MCHC: 32.2 g/dL (ref 30.0–36.0)
Platelets: 156 10*3/uL (ref 150–400)
RDW: 15.9 % — ABNORMAL HIGH (ref 11.5–15.5)

## 2013-10-12 LAB — MRSA PCR SCREENING: MRSA by PCR: POSITIVE — AB

## 2013-10-12 LAB — SODIUM, URINE, RANDOM: Sodium, Ur: 16 mEq/L

## 2013-10-12 LAB — OSMOLALITY: Osmolality: 307 mOsm/kg — ABNORMAL HIGH (ref 275–300)

## 2013-10-12 LAB — TSH: TSH: 1.741 u[IU]/mL (ref 0.350–4.500)

## 2013-10-12 MED ORDER — CHLORHEXIDINE GLUCONATE CLOTH 2 % EX PADS
6.0000 | MEDICATED_PAD | Freq: Every day | CUTANEOUS | Status: AC
  Administered 2013-10-12 – 2013-10-16 (×5): 6 via TOPICAL

## 2013-10-12 MED ORDER — SODIUM CHLORIDE 0.9 % IV BOLUS (SEPSIS)
500.0000 mL | Freq: Once | INTRAVENOUS | Status: AC
  Administered 2013-10-12: 500 mL via INTRAVENOUS

## 2013-10-12 MED ORDER — DEXTROSE 5 % IV SOLN
INTRAVENOUS | Status: DC
  Administered 2013-10-12: 12:00:00 via INTRAVENOUS

## 2013-10-12 MED ORDER — DILTIAZEM HCL 25 MG/5ML IV SOLN
15.0000 mg | Freq: Once | INTRAVENOUS | Status: DC
  Filled 2013-10-12: qty 5

## 2013-10-12 MED ORDER — METOPROLOL TARTRATE 1 MG/ML IV SOLN
2.5000 mg | Freq: Four times a day (QID) | INTRAVENOUS | Status: DC | PRN
  Administered 2013-10-12 – 2013-10-13 (×2): 2.5 mg via INTRAVENOUS
  Filled 2013-10-12 (×2): qty 5

## 2013-10-12 MED ORDER — METOPROLOL TARTRATE 1 MG/ML IV SOLN
5.0000 mg | Freq: Once | INTRAVENOUS | Status: AC
  Administered 2013-10-12: 02:00:00 5 mg via INTRAVENOUS
  Filled 2013-10-12: qty 5

## 2013-10-12 MED ORDER — MUPIROCIN 2 % EX OINT
1.0000 "application " | TOPICAL_OINTMENT | Freq: Two times a day (BID) | CUTANEOUS | Status: AC
  Administered 2013-10-12 – 2013-10-16 (×10): 1 via NASAL
  Filled 2013-10-12 (×2): qty 22

## 2013-10-12 MED ORDER — STARCH (THICKENING) PO POWD
ORAL | Status: DC | PRN
  Filled 2013-10-12: qty 227

## 2013-10-12 MED ORDER — AMIODARONE HCL IN DEXTROSE 360-4.14 MG/200ML-% IV SOLN
60.0000 mg/h | INTRAVENOUS | Status: AC
  Administered 2013-10-12: 60 mg/h via INTRAVENOUS
  Filled 2013-10-12 (×3): qty 200

## 2013-10-12 MED ORDER — DILTIAZEM HCL 100 MG IV SOLR
5.0000 mg/h | INTRAVENOUS | Status: DC
  Filled 2013-10-12: qty 100

## 2013-10-12 MED ORDER — AMIODARONE IV BOLUS ONLY 150 MG/100ML
150.0000 mg | Freq: Once | INTRAVENOUS | Status: AC
  Administered 2013-10-12: 150 mg via INTRAVENOUS
  Filled 2013-10-12: qty 100

## 2013-10-12 MED ORDER — CHLORHEXIDINE GLUCONATE 0.12 % MT SOLN
15.0000 mL | Freq: Two times a day (BID) | OROMUCOSAL | Status: DC
  Administered 2013-10-12 – 2013-10-22 (×18): 15 mL via OROMUCOSAL
  Filled 2013-10-12 (×26): qty 15

## 2013-10-12 MED ORDER — AMIODARONE HCL IN DEXTROSE 360-4.14 MG/200ML-% IV SOLN
30.0000 mg/h | INTRAVENOUS | Status: DC
  Administered 2013-10-12 – 2013-10-13 (×3): 30 mg/h via INTRAVENOUS
  Filled 2013-10-12 (×5): qty 200

## 2013-10-12 MED ORDER — AMIODARONE LOAD VIA INFUSION
150.0000 mg | Freq: Once | INTRAVENOUS | Status: AC
  Administered 2013-10-12: 150 mg via INTRAVENOUS
  Filled 2013-10-12: qty 83.34

## 2013-10-12 MED ORDER — DIGOXIN 0.25 MG/ML IJ SOLN
0.1250 mg | Freq: Once | INTRAMUSCULAR | Status: AC
  Administered 2013-10-12: 0.125 mg via INTRAVENOUS
  Filled 2013-10-12: qty 0.5

## 2013-10-12 MED ORDER — ENSURE PUDDING PO PUDG
1.0000 | Freq: Three times a day (TID) | ORAL | Status: DC
  Administered 2013-10-12 – 2013-10-14 (×7): 1 via ORAL

## 2013-10-12 MED ORDER — BIOTENE DRY MOUTH MT LIQD
15.0000 mL | Freq: Two times a day (BID) | OROMUCOSAL | Status: DC
  Administered 2013-10-13 – 2013-10-22 (×17): 15 mL via OROMUCOSAL

## 2013-10-12 MED ORDER — DIGOXIN 0.25 MG/ML IJ SOLN
0.2500 mg | Freq: Once | INTRAMUSCULAR | Status: AC
  Administered 2013-10-12: 0.25 mg via INTRAVENOUS
  Filled 2013-10-12 (×2): qty 1

## 2013-10-12 NOTE — Evaluation (Signed)
Clinical/Bedside Swallow Evaluation Patient Details  Name: Francisco Johnston MRN: [id number] Date of Birth: [birth year]  Today's Date: 10/12/2013 Time: 4098-1191 SLP Time Calculation (min): 24 min  Past Medical History:  Past Medical History  Diagnosis Date  . Heart disease   . Hypertension   . Iron deficiency anemia 06/12/2011  . CAD (coronary artery disease)   . Noncompliance   . ED (erectile dysfunction)   . IFG (impaired fasting glucose)   . Lacunar stroke   . Myocardial infarction 1980's X2  . Prostate cancer 1990's    "took radiation tx for that" (10/11/2013)  . Adenocarcinoma of colon 06/12/2011  . Pneumonia     "maybe now, never before" (10/11/2013)  . Stroke 08/06/2013    "paralyzed on the right side since" (10/11/2013)  . Arthritis     "probably; joints" (10/11/2013)  . Chronic kidney disease     "one kidney's smaller than the other; been like that forever" (10/11/2013)   Past Surgical History:  Past Surgical History  Procedure Laterality Date  . Colonoscopy    . Colectomy  ~ 2011  . Cataract extraction Right   . Ankle fracture surgery Left 1990's    "put pins in it" (10/11/2013)   HPI:  79 y/o elderly male with a past medical h/o left frontal hemorrhagic CVA, d/c'd  from the stroke service on 08/06/2013 to skilled nursing facility. He was transferred to the ED from his SNF yesterday evening with report of of overall functional decline/failure to thrive. Son reported has become increasingly lethargic, having excessive daytime sleepiness, minimal PO intake and overall steep decline in the last 4 days. His son notes that he has had chills as well as fever, temperature of 101 at his nursing home.  Dx Failure to thrive/functional decline likely secondary to acute encephalopathy in setting of underlying infectious process.  Hx of dysphagia   Assessment / Plan / Recommendation Clinical Impression  Clinical swallow eval reveals no functional improvements since last seen by SLP  services during September admission.  Pt has been on a dysphagia 1 diet with nectar-thick liquids at SNF.  Provided with these consistencies during today's assessment - demonstrated persisting oral deficits, palpable though likely delayed swallow response, and no overt s/s of aspiration.  Trials of thin liquid elicited cough response, suggesting penetration/aspiration.  Pt's positioning in bed - leaning to right, thrusting head into extension, interferes with safety when eating.  Requires full supervision for positioning and precautions.      Aspiration Risk  Moderate    Diet Recommendation Dysphagia 1 (Puree);Nectar-thick liquid   Liquid Administration via: Cup Medication Administration: Crushed with puree Supervision: Full supervision/cueing for compensatory strategies Compensations: Slow rate;Small sips/bites;Check for pocketing Postural Changes and/or Swallow Maneuvers: Seated upright 90 degrees;Upright 30-60 min after meal    Other  Recommendations Oral Care Recommendations: Oral care BID Other Recommendations: Order thickener from pharmacy   Follow Up Recommendations  Skilled Nursing facility    Frequency and Duration min 2x/week  1 week   Pertinent Vitals/Pain No c/o pain    SLP Swallow Goals  see care plan - f/u briefly for safety with recommended diet   Swallow Study Prior Functional Status       General Date of Onset:  (chronic) HPI: 79 y/o elderly male with a past medical h/o left frontal hemorrhagic CVA, d/c'd  from the stroke service on 08/06/2013 to skilled nursing facility. He was transferred to the ED from his SNF yesterday evening with report of of  overall functional decline/failure to thrive. Son reported has become increasingly lethargic, having excessive daytime sleepiness, minimal PO intake and overall steep decline in the last 4 days. His son notes that he has had chills as well as fever, temperature of 101 at his nursing home.  Dx Failure to thrive/functional  decline likely secondary to acute encephalopathy in setting of underlying infectious process.  Hx of dysphagia Type of Study: Bedside swallow evaluation Previous Swallow Assessment: MBS 08/07/13: mod oral and pharyngeal dysphagia.  Recs were for dysphagia 1, thin liquids with chin tuck to minimize aspiration.  Pt unable to follow through with precautions, so D/Cd from hospital in september 2014 on dys1, nectar-thick liquids.  Diet Prior to this Study: Dysphagia 1 (puree);Nectar-thick liquids Temperature Spikes Noted: Yes Respiratory Status: Room air Behavior/Cognition: Alert;Distractible Oral Cavity - Dentition: Edentulous Self-Feeding Abilities: Needs assist Patient Positioning: Postural control interferes with function Baseline Vocal Quality: Clear Volitional Cough: Strong Volitional Swallow: Unable to elicit    Oral/Motor/Sensory Function Overall Oral Motor/Sensory Function: Impaired at baseline   Ice Chips Ice chips: Not tested   Thin Liquid Thin Liquid: Impaired Presentation: Spoon Oral Phase Impairments: Reduced labial seal Oral Phase Functional Implications: Right anterior spillage Pharyngeal  Phase Impairments: Suspected delayed Swallow;Wet Vocal Quality;Cough - Immediate    Nectar Thick Nectar Thick Liquid: Impaired Presentation: Spoon;Straw Oral Phase Impairments: Reduced labial seal Oral phase functional implications: Right anterior spillage Pharyngeal Phase Impairments: Suspected delayed Swallow   Honey Thick Honey Thick Liquid: Not tested   Puree Puree: Impaired Presentation: Spoon Oral Phase Impairments: Reduced labial seal Oral Phase Functional Implications: Right anterior spillage Pharyngeal Phase Impairments: Suspected delayed Swallow   Solid       Solid: Not tested      Francisco Johnston, [place] CCC/SLP Pager [phone]  Blenda Mounts Francisco Johnston 10/12/2013,2:37 PM

## 2013-10-12 NOTE — Progress Notes (Signed)
Pt tx from 5W with Afib RVR, HR 150's to have diltiazem administered. BP 89/62, Tama Gander paged. Orders for 500cc bolus and change medication to Amiodarone gtt.

## 2013-10-12 NOTE — Progress Notes (Signed)
This nurse informed family of the need for transfer to a higher level of care due to heart rate. Patient converted at 1146 to a normal sinus rhythm and transfer was cancelled. This nurse once again called family and informed them of the cancellation of the transfer and that patient would still be in 3w04.  This nurse also called [facility] and talked with Tammy RN and told her that the transfer was cancelled.

## 2013-10-12 NOTE — ED Provider Notes (Signed)
See previous note.  I agree with plan and personally evaluated patient.  Francisco Johnston   Francisco Skeens, MD 10/12/13 [phone]

## 2013-10-12 NOTE — Progress Notes (Addendum)
[facility]                                                                                Patient Demographics  Francisco Johnston, is a 79 y.o. male, DOB - 1934/07/15, MVH:[id number]  Admit date - 10/11/2013   Admitting Physician Jeralyn Bennett, MD  Outpatient Primary MD for the patient is [name], MD  LOS - 1   Chief Complaint  Patient presents with  . Altered Mental Status        Assessment & Plan   Failure to thrive/functional decline -likely secondary to acute encephalopathy in setting of underlying infectious process. -Patient having ST functional decline over the last 4 days.  -CT scan of brain without contrast show no evidence of acute intracranial abnormality. -Initial lab work showed leukocytosis of 16,000 with a chest x-ray showing evidence of pneumonia. -Started on spectrum empiric IV antibiotic therapy, supportive care, admit to telemetry, consult PT.      A fib with RVR - IV digoxin and IV amiodarone , check TSH and echogram, cardiology consulted following the patient. We'll transfer to step down. IV fluid bolus also to support blood pressure Not Candidate for anticoagulation due to recent hemorrhagic CVA    Healthcare associated pneumonia (LUL) -Patient reportedly have a temperature 101 at his nursing home, developing chills, with lab work showing white count in the 16,000 range, and chest x-ray showing LUL  infiltrate.  -IV vancomycin and Zosyn -Obtain blood cultures.    Hypotension -dropped down to 89/62 -Secondary to atrial fibrillation with rapid rate, normal saline bolus, control rate, IV amiodarone under the guidance of cardiology was following the patient.     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 -Continue on dysphagia 1 diet with full feeding assistance and  aspiration precautions, we'll request speech to evaluate also while he is here.   Hypernatremia  In the presence of hypotension, normal saline bolus, will check urine sodium and urine osmolality along with serum osmolality, once BP pressure will place on D5W drip and monitor BMP closely.      Code Status: full  Family Communication: none  Disposition Plan: inpatient   Procedures none   Consults cardiology   Medications  Scheduled Meds: . antiseptic oral rinse  15 mL Mouth Rinse q12n4p  . aspirin  81 mg Oral Daily  . chlorhexidine  15 mL Mouth Rinse BID  . Chlorhexidine Gluconate Cloth  6 each Topical Q0600  . digoxin  0.25 mg Intravenous Once  . divalproex  750 mg Oral BID  . enoxaparin (LOVENOX) injection  40 mg Subcutaneous Q24H  . guaiFENesin  600 mg Oral BID  . metoprolol tartrate  25 mg Oral BID  . mupirocin ointment  1 application Nasal BID  . pantoprazole  40 mg Oral Daily  . piperacillin-tazobactam (ZOSYN)  IV  3.375 g Intravenous Q8H  . sodium chloride  3 mL Intravenous Q12H  . vancomycin  750 mg Intravenous Q12H   Continuous Infusions: . sodium chloride 75 mL/hr at 10/11/13 2234  .  amiodarone (NEXTERONE PREMIX) 360 mg/200 mL dextrose     Followed by  . amiodarone (NEXTERONE PREMIX) 360 mg/200 mL dextrose     PRN Meds:.acetaminophen, acetaminophen, alum & mag hydroxide-simeth, ondansetron (ZOFRAN) IV, ondansetron, oxyCODONE, sorbitol  DVT Prophylaxis  SCD  Lab Results  Component Value Date   PLT 156 10/12/2013    Antibiotics  IV zosyn and IV vancomycin   Anti-infectives   Start     Dose/Rate Route Frequency Ordered Stop   10/12/13 0600  vancomycin (VANCOCIN) IVPB 750 mg/150 ml premix     750 mg 150 mL/hr over 60 Minutes Intravenous Every 12 hours 10/11/13 2051     10/12/13 0400  piperacillin-tazobactam (ZOSYN) IVPB 3.375 g     3.375 g 12.5 mL/hr over 240 Minutes Intravenous 3 times per day 10/11/13 2051     10/11/13 1745  vancomycin  (VANCOCIN) IVPB 1000 mg/200 mL premix     1,000 mg 200 mL/hr over 60 Minutes Intravenous  Once 10/11/13 1736 10/11/13 1902   10/11/13 1715  ciprofloxacin (CIPRO) IVPB 400 mg  Status:  Discontinued     400 mg 200 mL/hr over 60 Minutes Intravenous  Once 10/11/13 1700 10/11/13 2002   10/11/13 1715  vancomycin (VANCOCIN) 15 mg/kg in sodium chloride 0.9 % 100 mL IVPB  Status:  Discontinued     15 mg/kg 100 mL/hr over 60 Minutes Intravenous  Once 10/11/13 1700 10/11/13 1735   10/11/13 1715  piperacillin-tazobactam (ZOSYN) IVPB 3.375 g     3.375 g 100 mL/hr over 30 Minutes Intravenous  Once 10/11/13 1700 10/11/13 1803          Subjective:   Guadalupe Dawn -unable to obtain objective.   Objective:   Filed Vitals:   10/12/13 0352 10/12/13 0519 10/12/13 0636 10/12/13 0650  BP:  105/69 89/62 91/69   Pulse:  105 96 145  Temp: 99.7 F (37.6 C) 98.9 F (37.2 C) 99.9 F (37.7 C)   TempSrc: Axillary Axillary Axillary   Resp:  22 20   Height:   6\' 2"  (1.88 m)   Weight:   61.9 kg (136 lb 7.4 oz)   SpO2:  93% 100%     Wt Readings from Last 3 Encounters:  10/12/13 61.9 kg (136 lb 7.4 oz)  08/07/13 69.1 kg (152 lb 5.4 oz)  04/05/13 72.122 kg (159 lb)     Intake/Output Summary (Last 24 hours) at 10/12/13 0836 Last data filed at 10/12/13 0014  Gross per 24 hour  Intake    125 ml  Output      0 ml  Net    125 ml    Exam Aphasic, Awake, able to node head and follows with gaze Supple Neck,No JVD, No cervical lymphadenopathy appriciated.  Symmetrical Chest wall movement, Good air movement bilaterally  Irregular rate and rhythm, No Gallops,Rubs or new Murmurs, No Parasternal Heave +B.Sounds, Abd Soft, Non tender, No organomegaly appriciated, No rebound - guarding or rigidity. No Cyanosis, Clubbing or edema, No new Rash or bruise unable to move right arm and right leg. Muscle tone on the left side intact.    Data Review   Micro Results Recent Results (from the past 240 hour(s))   MRSA PCR SCREENING     Status: Abnormal   Collection Time    10/11/13  8:07 PM      Result Value Range Status   MRSA by PCR POSITIVE (*) NEGATIVE Final   Comment:  The GeneXpert MRSA Assay (FDA     approved for NASAL specimens     only), is one component of a     comprehensive MRSA colonization     surveillance program. It is not     intended to diagnose MRSA     infection nor to guide or     monitor treatment for     MRSA infections.     RESULT CALLED TO, READ BACK BY AND VERIFIED WITH:     Hedda Slade BY [facility] 10/12/13 AT 12:28AM    Radiology Reports Dg Chest 2 View  10/11/2013   CLINICAL DATA:  Altered mental status.  EXAM: CHEST  2 VIEW  COMPARISON:  08/08/2013.  FINDINGS: There is left upper lobe airspace disease. There is no pleural effusion or pneumothorax. The heart size is normal. The osseous structures are unremarkable.  IMPRESSION: Left upper lobe pneumonia. Recommend followup radiography in 4-6 weeks, to document complete resolution following adequate medical therapy. If there is not complete resolution, then recommend further evaluation with CT of the chest to exclude underlying pathology.   Electronically Signed   By: Elige Ko   On: 10/11/2013 15:06   Ct Head Wo Contrast  10/11/2013   CLINICAL DATA:  Slurred speech. Altered mental status.  EXAM: CT HEAD WITHOUT CONTRAST  TECHNIQUE: Contiguous axial images were obtained from the base of the skull through the vertex without intravenous contrast.  COMPARISON:  CT 08/08/2013  FINDINGS: Acute hemorrhage in the left parietal white matter on the prior study has resolved. There is a 2 cm hypodensity in this area. No acute hemorrhage.  Moderate to advanced atrophy. Negative for hydrocephalus. Chronic microvascular ischemic change in the white matter. Negative for acute infarct or mass.  IMPRESSION: Left parietal white matter hypodensity consistent with resolving hematoma compared with the prior study.  Extensive atrophy  which has progressed from the prior study. No acute infarct or hemorrhage.   Electronically Signed   By: Marlan Palau M.D.   On: 10/11/2013 16:40    CBC  Recent Labs Lab 10/11/13 1700 10/11/13 2204 10/12/13 0500  WBC 16.4* 13.9* 13.1*  HGB 10.8* 10.9* 11.4*  HCT 33.1* 34.4* 35.4*  PLT 175 153 156  MCV 90.4 92.0 90.8  MCH 29.5 29.1 29.2  MCHC 32.6 31.7 32.2  RDW 15.9* 16.0* 15.9*  LYMPHSABS 1.3  --   --   MONOABS 1.3*  --   --   EOSABS 0.1  --   --   BASOSABS 0.0  --   --     Chemistries   Recent Labs Lab 10/11/13 1700 10/11/13 2204 10/12/13 0500  NA 147*  --  153*  K 4.4  --  4.2  CL 114*  --  118*  CO2 23  --  24  GLUCOSE 179*  --  100*  BUN 24*  --  20  CREATININE 0.75 0.83 0.80  CALCIUM 7.9*  --  8.4  AST 27  --   --   ALT 14  --   --   ALKPHOS 52  --   --   BILITOT 0.6  --   --    ------------------------------------------------------------------------------------------------------------------ estimated creatinine clearance is 68.8 ml/min (by C-G formula based on Cr of 0.8). ------------------------------------------------------------------------------------------------------------------  Coagulation profile  Recent Labs Lab 10/11/13 1700  INR 1.39    Cardiac Enzymes  Recent Labs Lab 10/11/13 1700 10/12/13 0500  TROPONINI <0.30 <0.30   ------------------------------------------------------------------------------------------------------------------     Time Spent in minutes  45   Jari Favre PA-S on 10/12/2013 at 8:36 AM  Between 7am to 7pm - Pager - [phone]  After 7pm go to [email or web address] - password [email or web address]  And look for the night coverage person covering for me after hours  [facility] Office  [phone]

## 2013-10-12 NOTE — Progress Notes (Signed)
5mg  metoprolol IV given per order. Will do a repeat EKG and continue to monitor.

## 2013-10-12 NOTE — Progress Notes (Signed)
  Echocardiogram 2D Echocardiogram has been performed.  Joseph Bias FRANCES 10/12/2013, 5:48 PM

## 2013-10-12 NOTE — Progress Notes (Signed)
Report called to [facility] RN Tammy . Will give am meds before transfer.  Currently 107 hr , 91/55 bp. Alert answers with head shakes and nods to yes or no questions. Condom cath in place. No o2 at this time.

## 2013-10-12 NOTE — Progress Notes (Signed)
PT Cancellation Note  Patient Details Name: Francisco Johnston MRN: [id number] DOB: 1934-12-06   Cancelled Treatment:    Reason Eval/Treat Not Completed: Patient not medically ready.  Pt being prepped to transfer to step down unit.  Will check back.   [name] LUBECK 10/12/2013, 10:51 AM

## 2013-10-12 NOTE — ED Provider Notes (Signed)
Medical screening examination/treatment/procedure(s) were conducted as a shared visit with non-physician practitioner(s) or resident  and myself.  I personally evaluated the patient during the encounter and agree with the findings and plan unless otherwise indicated.    I have personally reviewed any xrays and/ or EKG's with the provider and I agree with interpretation.   Known stroke hx with deficits, worsening confusion/ decr po intake.  Exam right side weakness chronic, mild lethargy, dry mm, few rales on exam, no distress.  CT no acute findings.  Pneumonia on cxr.  HCAP, labs and admission to medicine.  Signed out to follow until bed placement.    HCAP, Confusion/ lethargy, stroke hx, HypoCA, NH patient  Enid Skeens, MD 10/12/13 [phone]

## 2013-10-12 NOTE — Consult Note (Signed)
Reason for Consult: Atrial fibrillation with rapid ventricle response Referring Physician: [facility]  Francisco Johnston is an 79 y.o. male.  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 Cardiologic consultation is called as patient developed new onset A. fib during the hospital stay with RVR. Patient presently is awake denies any palpitations chest pain or shortness of breath. No further meaningful history could be obtained  as patient has mild aphasia. Patient just received  Bolus of amiodarone and started on drip 1 mg per minute. His heart rate has come down from 140s to 120s and blood pressure remains in 90s. Patient is due to get IV stage and will repeat amiodarone bolus and 1 hour if blood pressure remains stable and heart rate still in the 120s.  Past Medical History  Diagnosis Date  . Heart disease   . Hypertension   . Iron deficiency anemia 06/12/2011  . CAD (coronary artery disease)   . Noncompliance   . ED (erectile dysfunction)   . IFG (impaired fasting glucose)   . Lacunar stroke   . Myocardial infarction 1980's X2  . Prostate cancer 1990's    "took radiation tx for that" (10/11/2013)  . Adenocarcinoma of colon 06/12/2011  . Pneumonia     "maybe now, never before" (10/11/2013)  . Stroke 08/06/2013    "paralyzed on the right side since" (10/11/2013)  . Arthritis     "probably; joints" (10/11/2013)  . Chronic kidney disease     "one kidney's smaller than the other; been like that forever" (10/11/2013)    Past Surgical History  Procedure Laterality Date  . Colonoscopy    . Colectomy  ~ 2011  . Cataract  extraction Right   . Ankle fracture surgery Left 1990's    "put pins in it" (10/11/2013)    Family History  Problem Relation Age of Onset  . Cancer Father   . Cancer Sister     Social History:  reports that he has never smoked. He quit smokeless tobacco use about 2 months ago. His smokeless tobacco use included Snuff. He reports that he drinks alcohol. He reports that he does not use illicit drugs.  Allergies: No Known Allergies  Medications: I have reviewed the patient's current medications.  Results for orders placed during the hospital encounter of 10/11/13 (from the past 48 hour(s))  GLUCOSE, CAPILLARY     Status: Abnormal   Collection Time    10/11/13  3:10 PM      Result Value Range   Glucose-Capillary 61 (*) 70 - 99 mg/dL  CBC WITH DIFFERENTIAL     Status: Abnormal   Collection Time    10/11/13  5:00 PM      Result Value Range   WBC 16.4 (*) 4.0 - 10.5 K/uL   RBC 3.66 (*) 4.22 - 5.81 MIL/uL   Hemoglobin 10.8 (*) 13.0 - 17.0 g/dL   HCT 40.9 (*) 81.1 - 91.4 %   MCV 90.4  78.0 - 100.0 fL   MCH 29.5  26.0 - 34.0 pg   MCHC 32.6  30.0 - 36.0 g/dL   RDW 78.2 (*) 95.6 - 21.3 %   Platelets 175  150 - 400 K/uL  Neutrophils Relative % 84 (*) 43 - 77 %   Neutro Abs 13.8 (*) 1.7 - 7.7 K/uL   Lymphocytes Relative 8 (*) 12 - 46 %   Lymphs Abs 1.3  0.7 - 4.0 K/uL   Monocytes Relative 8  3 - 12 %   Monocytes Absolute 1.3 (*) 0.1 - 1.0 K/uL   Eosinophils Relative 0  0 - 5 %   Eosinophils Absolute 0.1  0.0 - 0.7 K/uL   Basophils Relative 0  0 - 1 %   Basophils Absolute 0.0  0.0 - 0.1 K/uL  COMPREHENSIVE METABOLIC PANEL     Status: Abnormal   Collection Time    10/11/13  5:00 PM      Result Value Range   Sodium 147 (*) 135 - 145 mEq/L   Potassium 4.4  3.5 - 5.1 mEq/L   Chloride 114 (*) 96 - 112 mEq/L   CO2 23  19 - 32 mEq/L   Glucose, Bld 179 (*) 70 - 99 mg/dL   BUN 24 (*) 6 - 23 mg/dL   Creatinine, Ser 4.09  0.50 - 1.35 mg/dL   Calcium 7.9 (*) 8.4 - 10.5 mg/dL   Total  Protein 7.2  6.0 - 8.3 g/dL   Albumin 1.6 (*) 3.5 - 5.2 g/dL   AST 27  0 - 37 U/L   ALT 14  0 - 53 U/L   Alkaline Phosphatase 52  39 - 117 U/L   Total Bilirubin 0.6  0.3 - 1.2 mg/dL   GFR calc non Af Amer 87 (*) >90 mL/min   GFR calc Af Amer >90  >90 mL/min   Comment: (NOTE)     The eGFR has been calculated using the CKD EPI equation.     This calculation has not been validated in all clinical situations.     eGFR's persistently <90 mL/min signify possible Chronic Kidney     Disease.  TROPONIN I     Status: None   Collection Time    10/11/13  5:00 PM      Result Value Range   Troponin I <0.30  <0.30 ng/mL   Comment:            Due to the release kinetics of cTnI,     a negative result within the first hours     of the onset of symptoms does not rule out     myocardial infarction with certainty.     If myocardial infarction is still suspected,     repeat the test at appropriate intervals.  PROTIME-INR     Status: Abnormal   Collection Time    10/11/13  5:00 PM      Result Value Range   Prothrombin Time 16.7 (*) 11.6 - 15.2 seconds   INR 1.39  0.00 - 1.49  APTT     Status: None   Collection Time    10/11/13  5:00 PM      Result Value Range   aPTT 32  24 - 37 seconds  VALPROIC ACID LEVEL     Status: None   Collection Time    10/11/13  5:00 PM      Result Value Range   Valproic Acid Lvl 59.4  50.0 - 100.0 ug/mL  POCT I-STAT TROPONIN I     Status: None   Collection Time    10/11/13  5:07 PM      Result Value Range   Troponin i, poc 0.00  0.00 - 0.08  ng/mL   Comment 3            Comment: Due to the release kinetics of cTnI,     a negative result within the first hours     of the onset of symptoms does not rule out     myocardial infarction with certainty.     If myocardial infarction is still suspected,     repeat the test at appropriate intervals.  GLUCOSE, CAPILLARY     Status: Abnormal   Collection Time    10/11/13  5:29 PM      Result Value Range    Glucose-Capillary 123 (*) 70 - 99 mg/dL  URINALYSIS, ROUTINE W REFLEX MICROSCOPIC     Status: Abnormal   Collection Time    10/11/13  5:37 PM      Result Value Range   Color, Urine AMBER (*) YELLOW   Comment: BIOCHEMICALS MAY BE AFFECTED BY COLOR   APPearance CLEAR  CLEAR   Specific Gravity, Urine 1.028  1.005 - 1.030   pH 6.0  5.0 - 8.0   Glucose, UA 250 (*) NEGATIVE mg/dL   Hgb urine dipstick NEGATIVE  NEGATIVE   Bilirubin Urine SMALL (*) NEGATIVE   Ketones, ur 15 (*) NEGATIVE mg/dL   Protein, ur 30 (*) NEGATIVE mg/dL   Urobilinogen, UA >8.1 (*) 0.0 - 1.0 mg/dL   Nitrite POSITIVE (*) NEGATIVE   Leukocytes, UA TRACE (*) NEGATIVE  URINE RAPID DRUG SCREEN (HOSP PERFORMED)     Status: None   Collection Time    10/11/13  5:37 PM      Result Value Range   Opiates NONE DETECTED  NONE DETECTED   Cocaine NONE DETECTED  NONE DETECTED   Benzodiazepines NONE DETECTED  NONE DETECTED   Amphetamines NONE DETECTED  NONE DETECTED   Tetrahydrocannabinol NONE DETECTED  NONE DETECTED   Barbiturates NONE DETECTED  NONE DETECTED   Comment:            DRUG SCREEN FOR MEDICAL PURPOSES     ONLY.  IF CONFIRMATION IS NEEDED     FOR ANY PURPOSE, NOTIFY LAB     WITHIN 5 DAYS.                LOWEST DETECTABLE LIMITS     FOR URINE DRUG SCREEN     Drug Class       Cutoff (ng/mL)     Amphetamine      1000     Barbiturate      200     Benzodiazepine   200     Tricyclics       300     Opiates          300     Cocaine          300     THC              50  URINE MICROSCOPIC-ADD ON     Status: Abnormal   Collection Time    10/11/13  5:37 PM      Result Value Range   Squamous Epithelial / LPF RARE  RARE   WBC, UA 0-2  <3 WBC/hpf   RBC / HPF 0-2  <3 RBC/hpf   Bacteria, UA MANY (*) RARE   Urine-Other MUCOUS PRESENT    GLUCOSE, CAPILLARY     Status: Abnormal   Collection Time    10/11/13  6:52 PM      Result Value Range  Glucose-Capillary 121 (*) 70 - 99 mg/dL  MRSA PCR SCREENING     Status:  Abnormal   Collection Time    10/11/13  8:07 PM      Result Value Range   MRSA by PCR POSITIVE (*) NEGATIVE   Comment:            The GeneXpert MRSA Assay (FDA     approved for NASAL specimens     only), is one component of a     comprehensive MRSA colonization     surveillance program. It is not     intended to diagnose MRSA     infection nor to guide or     monitor treatment for     MRSA infections.     RESULT CALLED TO, READ BACK BY AND VERIFIED WITH:     Francisco Johnston BY [facility] 10/12/13 AT 12:28AM  CBC     Status: Abnormal   Collection Time    10/11/13 10:04 PM      Result Value Range   WBC 13.9 (*) 4.0 - 10.5 K/uL   RBC 3.74 (*) 4.22 - 5.81 MIL/uL   Hemoglobin 10.9 (*) 13.0 - 17.0 g/dL   HCT 16.1 (*) 09.6 - 04.5 %   MCV 92.0  78.0 - 100.0 fL   MCH 29.1  26.0 - 34.0 pg   MCHC 31.7  30.0 - 36.0 g/dL   RDW 40.9 (*) 81.1 - 91.4 %   Platelets 153  150 - 400 K/uL  CREATININE, SERUM     Status: Abnormal   Collection Time    10/11/13 10:04 PM      Result Value Range   Creatinine, Ser 0.83  0.50 - 1.35 mg/dL   GFR calc non Af Amer 83 (*) >90 mL/min   GFR calc Af Amer >90  >90 mL/min   Comment: (NOTE)     The eGFR has been calculated using the CKD EPI equation.     This calculation has not been validated in all clinical situations.     eGFR's persistently <90 mL/min signify possible Chronic Kidney     Disease.  BASIC METABOLIC PANEL     Status: Abnormal   Collection Time    10/12/13  5:00 AM      Result Value Range   Sodium 153 (*) 135 - 145 mEq/L   Potassium 4.2  3.5 - 5.1 mEq/L   Chloride 118 (*) 96 - 112 mEq/L   CO2 24  19 - 32 mEq/L   Glucose, Bld 100 (*) 70 - 99 mg/dL   BUN 20  6 - 23 mg/dL   Creatinine, Ser 7.82  0.50 - 1.35 mg/dL   Calcium 8.4  8.4 - 95.6 mg/dL   GFR calc non Af Amer 85 (*) >90 mL/min   GFR calc Af Amer >90  >90 mL/min   Comment: (NOTE)     The eGFR has been calculated using the CKD EPI equation.     This calculation has not been validated in  all clinical situations.     eGFR's persistently <90 mL/min signify possible Chronic Kidney     Disease.  CBC     Status: Abnormal   Collection Time    10/12/13  5:00 AM      Result Value Range   WBC 13.1 (*) 4.0 - 10.5 K/uL   RBC 3.90 (*) 4.22 - 5.81 MIL/uL   Hemoglobin 11.4 (*) 13.0 - 17.0 g/dL   HCT 21.3 (*) 08.6 -  52.0 %   MCV 90.8  78.0 - 100.0 fL   MCH 29.2  26.0 - 34.0 pg   MCHC 32.2  30.0 - 36.0 g/dL   RDW 78.2 (*) 95.6 - 21.3 %   Platelets 156  150 - 400 K/uL  TROPONIN I     Status: None   Collection Time    10/12/13  5:00 AM      Result Value Range   Troponin I <0.30  <0.30 ng/mL   Comment:            Due to the release kinetics of cTnI,     a negative result within the first hours     of the onset of symptoms does not rule out     myocardial infarction with certainty.     If myocardial infarction is still suspected,     repeat the test at appropriate intervals.    Dg Chest 2 View  10/11/2013   CLINICAL DATA:  Altered mental status.  EXAM: CHEST  2 VIEW  COMPARISON:  08/08/2013.  FINDINGS: There is left upper lobe airspace disease. There is no pleural effusion or pneumothorax. The heart size is normal. The osseous structures are unremarkable.  IMPRESSION: Left upper lobe pneumonia. Recommend followup radiography in 4-6 weeks, to document complete resolution following adequate medical therapy. If there is not complete resolution, then recommend further evaluation with CT of the chest to exclude underlying pathology.   Electronically Signed   By: Elige Ko   On: 10/11/2013 15:06   Ct Head Wo Contrast  10/11/2013   CLINICAL DATA:  Slurred speech. Altered mental status.  EXAM: CT HEAD WITHOUT CONTRAST  TECHNIQUE: Contiguous axial images were obtained from the base of the skull through the vertex without intravenous contrast.  COMPARISON:  CT 08/08/2013  FINDINGS: Acute hemorrhage in the left parietal white matter on the prior study has resolved. There is a 2 cm hypodensity  in this area. No acute hemorrhage.  Moderate to advanced atrophy. Negative for hydrocephalus. Chronic microvascular ischemic change in the white matter. Negative for acute infarct or mass.  IMPRESSION: Left parietal white matter hypodensity consistent with resolving hematoma compared with the prior study.  Extensive atrophy which has progressed from the prior study. No acute infarct or hemorrhage.   Electronically Signed   By: Francisco Johnston M.D.   On: 10/11/2013 16:40    Review of Systems  Unable to perform ROS: unstable vital signs   Blood pressure 91/69, pulse 145, temperature 99.9 F (37.7 C), temperature source Axillary, resp. rate 20, height 6\' 2"  (1.88 m), weight 61.9 kg (136 lb 7.4 oz), SpO2 100.00%. Physical Exam  HENT:  Head: Normocephalic and atraumatic.  Eyes: Conjunctivae are normal. Left eye exhibits no discharge.  Neck: Normal range of motion. Neck supple. No JVD present. No tracheal deviation present. No thyromegaly present.  Cardiovascular:  Irregularly irregular tachycardic S1 and S2 soft  Respiratory:  Decreased breath sound at bases and occasional rhonchi left lung field  GI: Soft. Bowel sounds are normal. He exhibits no distension. There is no tenderness. There is no rebound.  Musculoskeletal: He exhibits no edema.    Assessment/Plan: Paroxysmal recurrent A. fib with RVR Hypertension Coronary artery disease history of questionable septal MI in the past Status post hemorrhagic left frontoparietal CVA with right paresis Left upper lobe pneumonia Glucose intolerance Seizure disorder History of adenocarcinoma of colon Failure to thrive Plan Agree with IV amiodarone and digoxin. And hopefully will get converted  to sinus rhythm. May need cardioversion if hemodynamically unstable and rate not well controlled. Patient clearly is not a candidate for anticoagulation Check 2-D echo and thyroid function Check serial enzymes and EKG   Amnah Breuer N 10/12/2013, 8:43 AM

## 2013-10-12 NOTE — Care Management Note (Unsigned)
    Page 1 of 1   10/12/2013     11:38:29 AM   CARE MANAGEMENT NOTE 10/12/2013  Patient:  Francisco Johnston, Francisco Johnston   Account Number:  [id number]  Date Initiated:  10/12/2013  Documentation initiated by:  GRAVES-BIGELOW,Kellen Hover  Subjective/Objective Assessment:   Pt admitted for Failure to thrive and CXR showed Left upper lobe pneumonia. Pt is from [facility].     Action/Plan:   CSW to assist with disposition needs for pt.   Anticipated DC Date:  10/14/2013   Anticipated DC Plan:  SKILLED NURSING FACILITY  In-house referral  Clinical Social Worker      DC Planning Services  CM consult      Choice offered to / List presented to:             Status of service:  In process, will continue to follow Medicare Important Message given?   (If response is "NO", the following Medicare IM given date fields will be blank) Date Medicare IM given:   Date Additional Medicare IM given:    Discharge Disposition:    Per UR Regulation:  Reviewed for med. necessity/level of care/duration of stay  If discussed at Long Length of Stay Meetings, dates discussed:    Comments:

## 2013-10-12 NOTE — Progress Notes (Signed)
Event:  Notified by RN that pt's HR in the 150-190's and rhythm appears to be A-Fib. Pt sleeping and in NAD. EKG ordered. NP to bedside. Subjective: Pt unable to provide information d/t AMS. Objective: Francisco Johnston is a 79 y/o elderly male with a past medical h/o left frontal hemorrhagic CVA, d/c'd  from the stroke service on 08/06/2013 to skilled nursing facility. He was transferred to the ED from his SNF yesterday evening with report of of overall functional decline/failure to thrive. Son reported has become increasingly lethargic, having excessive daytime sleepiness, minimal PO intake and overall steep decline in the last 4 days. His son notes that he has had chills as well as fever, temperature of 101 at his nursing home. There have been no reports of cough, shortness of breath, sputum production, or chest pain. initial lab work in the emergency department showed a white count of 16,400 with a chest x-ray showing left upper lobe pneumonia. He was administered IV vancomycin and Zosyn for treatment of healthcare associated pneumonia. CT scan of brain without contrast did not show evidence of acute intracranial changes. At bedside pt noted resting w/eyes close in NAD. He awakens easily and will follow simple commands.  Skin w/d, color normal. BBS diminished but otherwise CTA. T-100.3, BP-129/69, P-140's, R-18 w/ 02 sats of 93-95% on R/A. Initial EKG reveals A-Fib w/ RVR w/ rate of 165. Some complexes appear to have p-waves so Lopressor 5mg  given IV and EKG repeated. Repeat EKG reveals A-Fib w/ RVR w/ rate of 152 and does appear c/w A-Fib. Rate has been refractory to IV Lopressor and IV Digoxin. Troponin pending.  Assessment/Plan: 1. New onset A-Fib w/ RVR: Likely associated w/ pt's age and acute lung illness. Rate still 140-160's. Will need transfer to SDU for IV Cardizem as unable to be given on floor. Pt is otherwise stable. Will f/u Troponin. Pt is on Lovenox 40 mg QD (for VTE prophylaxis) and qd ASA 81  mg. Given pt's h/o hemorrhagic CVA will not initiate Heparin or increase Lovenox. I have discussed pt w/ Dr Allena Katz who has also reviewed EKG's and is agreement w/ plan. Will continue to monitor closely in SDU.  Leanne Chang, NP-C [facility] Pager [phone]

## 2013-10-12 NOTE — Progress Notes (Signed)
Orders given to transfer pt off unit to 3W. Report called to [facility], RN & transferred to [phone]. Family informed about pt's condition and room/unit change. Pt stable at time of transfer.

## 2013-10-12 NOTE — Progress Notes (Signed)
INITIAL NUTRITION ASSESSMENT  DOCUMENTATION CODES Per approved criteria  -Severe malnutrition in the context of chronic illness -Underweight   INTERVENTION:  Ensure Pudding po TID, each supplement provides 170 kcal and 4 grams of protein.   Consider swallow evaluation with SLP for potential diet upgrade.  NUTRITION DIAGNOSIS: Malnutrition related to inadequate oral intake as evidenced by severe depletion of muscle mass and subcutaneous fat mass with 11% weight loss within the past month.   Goal: Intake to meet >90% of estimated nutrition needs.  Monitor:  PO intake, labs, weight trend.  Reason for Assessment: MST=3  79 y.o. male  Admitting Dx: FTT; A fib with RVR  ASSESSMENT: Patient with a past medical history of left frontal hemorrhagic CVA, discharged from the stroke service on 08/06/2013 to skilled nursing facility; was transferred to the ED from his SNF on 11/5 with complaints of overall functional decline/failure to thrive.  Patient unable to provide much nutrition history; he does report that he likes Ensure.  Nutrition Focused Physical Exam:  Subcutaneous Fat:  Orbital Region: severe depletion Upper Arm Region: severe depletion Thoracic and Lumbar Region: severe depletion  Muscle:  Temple Region: severe depletion Clavicle Bone Region: severe depletion Clavicle and Acromion Bone Region: severe depletion Scapular Bone Region: severe depletion Dorsal Hand: severe depletion Patellar Region: severe depletion Anterior Thigh Region: severe depletion Posterior Calf Region: severe depletion  Edema: none  Pt meets criteria for severe MALNUTRITION in the context of chronic illness as evidenced by severe depletion of muscle mass and subcutaneous fat mass with 11% weight loss within the past month.  Height: Ht Readings from Last 1 Encounters:  10/12/13 6\' 2"  (1.88 m)    Weight: Wt Readings from Last 1 Encounters:  10/12/13 136 lb 7.4 oz (61.9 kg)    Ideal  Body Weight: 86.4 kg  % Ideal Body Weight: 72%  Wt Readings from Last 10 Encounters:  10/12/13 136 lb 7.4 oz (61.9 kg)  08/07/13 152 lb 5.4 oz (69.1 kg)  04/05/13 159 lb (72.122 kg)  03/29/13 155 lb (70.308 kg)  09/28/12 151 lb (68.493 kg)  03/29/12 157 lb 1.6 oz (71.26 kg)  09/25/11 164 lb (74.39 kg)  05/20/11 166 lb (75.297 kg)    Usual Body Weight: 152 lb (2 months ago)  % Usual Body Weight: 89%  BMI:  Body mass index is 17.51 kg/(m^2). underweight  Estimated Nutritional Needs: Kcal: 1900-2100 Protein: 100-120 gm Fluid: 1.9-2.1 L  Skin: no wounds  Diet Order: Dysphagia 1 with nectar thick liquids  EDUCATION NEEDS: -Education not appropriate at this time   Intake/Output Summary (Last 24 hours) at 10/12/13 0940 Last data filed at 10/12/13 0014  Gross per 24 hour  Intake    125 ml  Output      0 ml  Net    125 ml    Last BM: unknown   Labs:   Recent Labs Lab 10/11/13 1700 10/11/13 2204 10/12/13 0500  NA 147*  --  153*  K 4.4  --  4.2  CL 114*  --  118*  CO2 23  --  24  BUN 24*  --  20  CREATININE 0.75 0.83 0.80  CALCIUM 7.9*  --  8.4  GLUCOSE 179*  --  100*    CBG (last 3)   Recent Labs  10/11/13 1510 10/11/13 1729 10/11/13 1852  GLUCAP 61* 123* 121*    Scheduled Meds: . amiodarone  150 mg Intravenous Once  . antiseptic oral rinse  15  mL Mouth Rinse q12n4p  . aspirin  81 mg Oral Daily  . chlorhexidine  15 mL Mouth Rinse BID  . Chlorhexidine Gluconate Cloth  6 each Topical Q0600  . digoxin  0.25 mg Intravenous Once  . divalproex  750 mg Oral BID  . enoxaparin (LOVENOX) injection  40 mg Subcutaneous Q24H  . guaiFENesin  600 mg Oral BID  . metoprolol tartrate  25 mg Oral BID  . mupirocin ointment  1 application Nasal BID  . pantoprazole  40 mg Oral Daily  . piperacillin-tazobactam (ZOSYN)  IV  3.375 g Intravenous Q8H  . sodium chloride  3 mL Intravenous Q12H  . vancomycin  750 mg Intravenous Q12H    Continuous Infusions: . sodium  chloride 75 mL/hr at 10/11/13 2234  . amiodarone (NEXTERONE PREMIX) 360 mg/200 mL dextrose 60 mg/hr (10/12/13 0700)   Followed by  . amiodarone (NEXTERONE PREMIX) 360 mg/200 mL dextrose      Past Medical History  Diagnosis Date  . Heart disease   . Hypertension   . Iron deficiency anemia 06/12/2011  . CAD (coronary artery disease)   . Noncompliance   . ED (erectile dysfunction)   . IFG (impaired fasting glucose)   . Lacunar stroke   . Myocardial infarction 1980's X2  . Prostate cancer 1990's    "took radiation tx for that" (10/11/2013)  . Adenocarcinoma of colon 06/12/2011  . Pneumonia     "maybe now, never before" (10/11/2013)  . Stroke 08/06/2013    "paralyzed on the right side since" (10/11/2013)  . Arthritis     "probably; joints" (10/11/2013)  . Chronic kidney disease     "one kidney's smaller than the other; been like that forever" (10/11/2013)    Past Surgical History  Procedure Laterality Date  . Colonoscopy    . Colectomy  ~ 2011  . Cataract extraction Right   . Ankle fracture surgery Left 1990's    "put pins in it" (10/11/2013)    Joaquin Courts, RD, LDN, CNSC Pager [phone] After Hours Pager [phone]

## 2013-10-12 NOTE — Progress Notes (Signed)
Pt's HR got up to 190's & continues to jump from 150's to 180's. Pt is currently sleeping. On call NP, Schorr notified. Obtained VS & NT is currently getting EKG. Schorr stated she will review EKG in EPIC. Will continue to monitor pt.

## 2013-10-12 NOTE — Progress Notes (Signed)
Pt eating small amounts at frequent intervals.  Pt gets tired and swallowing becomes more uncoordinated as the feeding continues.  Pt swallowed all medicines with some difficulty with the Guaifenesin which cannot be crushed but was cut into small sections instead.  Pt left with HOB elevated at 75 degrees for 30 minutes and pt mouth checked for residual medications and food.

## 2013-10-13 LAB — BASIC METABOLIC PANEL
CO2: 24 mEq/L (ref 19–32)
Calcium: 7.6 mg/dL — ABNORMAL LOW (ref 8.4–10.5)
Chloride: 113 mEq/L — ABNORMAL HIGH (ref 96–112)
Creatinine, Ser: 0.81 mg/dL (ref 0.50–1.35)
GFR calc Af Amer: >90 mL/min (ref >90)
GFR calc non Af Amer: 84 mL/min — ABNORMAL LOW (ref >90)
Glucose, Bld: 113 mg/dL — ABNORMAL HIGH (ref 70–99)
Sodium: 146 mEq/L — ABNORMAL HIGH (ref 135–145)

## 2013-10-13 LAB — CBC
HCT: 32.1 % — ABNORMAL LOW (ref 39.0–52.0)
MCHC: 32.4 g/dL (ref 30.0–36.0)
MCV: 90.4 fL (ref 78.0–100.0)
RBC: 3.55 MIL/uL — ABNORMAL LOW (ref 4.22–5.81)
RDW: 15.8 % — ABNORMAL HIGH (ref 11.5–15.5)
WBC: 12.3 10*3/uL — ABNORMAL HIGH (ref 4.0–10.5)

## 2013-10-13 MED ORDER — AMIODARONE HCL 200 MG PO TABS
200.0000 mg | ORAL_TABLET | Freq: Two times a day (BID) | ORAL | Status: DC
  Administered 2013-10-13 – 2013-10-14 (×3): 200 mg via ORAL
  Filled 2013-10-13 (×4): qty 1

## 2013-10-13 MED ORDER — METOPROLOL TARTRATE 1 MG/ML IV SOLN: 5.0000 mg | Freq: Once | INTRAVENOUS | Status: DC

## 2013-10-13 MED ORDER — POTASSIUM CL IN DEXTROSE 5% 20 MEQ/L IV SOLN
20.0000 meq | INTRAVENOUS | Status: DC
  Administered 2013-10-13 (×2): 20 meq via INTRAVENOUS
  Filled 2013-10-13 (×3): qty 1000

## 2013-10-13 MED ORDER — DILTIAZEM HCL 25 MG/5ML IV SOLN
10.0000 mg | Freq: Once | INTRAVENOUS | Status: AC
  Administered 2013-10-13: 10 mg via INTRAVENOUS
  Filled 2013-10-13: qty 5

## 2013-10-13 MED ORDER — VANCOMYCIN HCL IN DEXTROSE 750-5 MG/150ML-% IV SOLN
750.0000 mg | Freq: Three times a day (TID) | INTRAVENOUS | Status: DC
  Administered 2013-10-14: 750 mg via INTRAVENOUS
  Filled 2013-10-13 (×3): qty 150

## 2013-10-13 MED ORDER — METOPROLOL TARTRATE 1 MG/ML IV SOLN
2.5000 mg | Freq: Once | INTRAVENOUS | Status: AC
  Administered 2013-10-13: 2.5 mg via INTRAVENOUS

## 2013-10-13 MED ORDER — DILTIAZEM HCL 60 MG PO TABS
120.0000 mg | ORAL_TABLET | Freq: Three times a day (TID) | ORAL | Status: DC
  Administered 2013-10-13 – 2013-10-14 (×4): 120 mg via ORAL
  Filled 2013-10-13 (×7): qty 2

## 2013-10-13 MED ORDER — MAGNESIUM SULFATE IN D5W 10-5 MG/ML-% IV SOLN: 1.0000 g | Freq: Once | INTRAVENOUS | Status: DC

## 2013-10-13 MED ORDER — POTASSIUM CHLORIDE 20 MEQ/15ML (10%) PO LIQD
40.0000 meq | Freq: Once | ORAL | Status: AC
  Administered 2013-10-13: 40 meq via ORAL
  Filled 2013-10-13: qty 30

## 2013-10-13 MED ORDER — POTASSIUM CHLORIDE CRYS ER 20 MEQ PO TBCR
40.0000 meq | EXTENDED_RELEASE_TABLET | Freq: Once | ORAL | Status: AC
  Administered 2013-10-13: 40 meq via ORAL
  Filled 2013-10-13: qty 2

## 2013-10-13 NOTE — Progress Notes (Signed)
ANTIBIOTIC CONSULT NOTE - FOLLOW UP  Pharmacy Consult for vancomycin, zosyn Indication: PNA  No Known Allergies  Patient Measurements: Height: 6\' 2"  (188 cm) Weight: 136 lb 7.4 oz (61.9 kg) IBW/kg (Calculated) : 82.2  Vital Signs: Temp: 98.7 F (37.1 C) (11/07 1700) Temp src: Oral (11/07 1700) BP: 137/100 mmHg (11/07 1900) Pulse Rate: 90 (11/07 1900) Intake/Output from previous day: 11/06 0701 - 11/07 0700 In: 2939.9 [P.O.:280; I.V.:1209.9; IV Piggyback:450] Out: 727 [Urine:725; Stool:2] Intake/Output from this shift:    Labs:  Recent Labs  10/11/13 2204 10/12/13 0500 10/12/13 1623 10/13/13 0317  WBC 13.9* 13.1*  --  12.3*  HGB 10.9* 11.4*  --  10.4*  PLT 153 156  --  142*  LABCREA  --   --  177.40  --   CREATININE 0.83 0.80  --  0.81   Estimated Creatinine Clearance: 67.9 ml/min (by C-G formula based on Cr of 0.81).  Recent Labs  10/13/13 1840  VANCOTROUGH 9.1*      Assessment: 79 yo male here with fever and noted with HCAP on vancomycin and Zosyn. WBC= 12.3, SCr 0.81 and CrCl ~70.  A vancomycin trough today was 9.1 and noted below goal.  11/5 zosyn 11/5 vancomycin  11/5 blood x2: ngtd  Goal of Therapy:  Vancomycin trough level 15-20 mcg/ml  Plan:  -Change vancomycin to 750mg  IV q8hr -Will follow renal function, cultures and clinical progress  Harland German, Pharm D 10/13/2013 8:30 PM

## 2013-10-13 NOTE — Progress Notes (Signed)
[facility]                                                                                Patient Demographics  Francisco Johnston, is a 79 y.o. male, DOB - 09/10/1934, ZOX:[id number]  Admit date - 10/11/2013   Admitting Physician Jeralyn Bennett, MD  Outpatient Primary MD for the patient is [name], MD  LOS - 2   Chief Complaint  Patient presents with  . Altered Mental Status        Assessment & Plan   Failure to thrive/functional decline from Met Encephalopathy HCAP  -Patient reportedly have a temperature 101 at his nursing home, developing chills, with lab work showing white count in the 16,000 range, and chest x-ray showing LUL  infiltrate.  -IV vancomycin and Zosyn -Monitor blood cultures -Continue aspiration precautions, feeding assistance. Speech following on dysphagia 1 diet with nectar thick liquids   New onset A fib with RVR Cardiology following, on IV amiodarone drip, also post digoxin on 10/12/2013, low-dose beta blocker on board, Cardizem will be added as rate control and rhythm control still an issue , stable TSH and echogram with EF of around 60%, he is not a Candidate for anticoagulation due to recent hemorrhagic CVA, into the son clearly. Remains at risk for a new ischemic stroke.  Lab Results  Component Value Date   TSH 1.741 10/12/2013        Hypotension To combination of pneumonia along with A. fib with RVR. Blood pressure improved after IV fluids.     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 -Continue on dysphagia 1 diet with full feeding assistance and aspiration precautions, we'll request speech to evaluate also while he is here.     Hypernatremia  Much improved after D5W and normal saline for hydration. Since blood pressure better continue  D5W.    Hypokalemia  replaced IV and oral       Code Status: full  Family Communication: none  Disposition Plan: inpatient   Procedures none   Consults cardiology   Medications  Scheduled Meds: . antiseptic oral rinse  15 mL Mouth Rinse q12n4p  . aspirin  81 mg Oral Daily  . chlorhexidine  15 mL Mouth Rinse BID  . Chlorhexidine Gluconate Cloth  6 each Topical Q0600  . diltiazem  10 mg Intravenous Once  . diltiazem  120 mg Oral Q8H  . divalproex  750 mg Oral BID  . feeding supplement (ENSURE)  1 Container Oral TID BM  . guaiFENesin  600 mg Oral BID  . metoprolol tartrate  25 mg Oral BID  . mupirocin ointment  1 application Nasal BID  . pantoprazole  40 mg Oral Daily  . piperacillin-tazobactam (ZOSYN)  IV  3.375 g Intravenous Q8H  . potassium chloride  40 mEq Oral Once  . sodium chloride  3 mL Intravenous Q12H  . vancomycin  750 mg Intravenous Q12H   Continuous Infusions: . amiodarone (NEXTERONE PREMIX) 360 mg/200 mL dextrose 30 mg/hr (10/13/13 0440)  . dextrose 5 %  with KCl 20 mEq / L     PRN Meds:.acetaminophen, acetaminophen, alum & mag hydroxide-simeth, food thickener, metoprolol, ondansetron (ZOFRAN) IV, ondansetron, oxyCODONE, sorbitol  DVT Prophylaxis  SCD  Lab Results  Component Value Date   PLT 142* 10/13/2013    Antibiotics  IV zosyn and IV vancomycin   Anti-infectives   Start     Dose/Rate Route Frequency Ordered Stop   10/12/13 0600  vancomycin (VANCOCIN) IVPB 750 mg/150 ml premix     750 mg 150 mL/hr over 60 Minutes Intravenous Every 12 hours 10/11/13 2051     10/12/13 0400  piperacillin-tazobactam (ZOSYN) IVPB 3.375 g     3.375 g 12.5 mL/hr over 240 Minutes Intravenous 3 times per day 10/11/13 2051     10/11/13 1745  vancomycin (VANCOCIN) IVPB 1000 mg/200 mL premix     1,000 mg 200 mL/hr over 60 Minutes Intravenous  Once 10/11/13 1736 10/11/13 1902   10/11/13 1715  ciprofloxacin (CIPRO) IVPB 400 mg  Status:  Discontinued     400  mg 200 mL/hr over 60 Minutes Intravenous  Once 10/11/13 1700 10/11/13 2002   10/11/13 1715  vancomycin (VANCOCIN) 15 mg/kg in sodium chloride 0.9 % 100 mL IVPB  Status:  Discontinued     15 mg/kg 100 mL/hr over 60 Minutes Intravenous  Once 10/11/13 1700 10/11/13 1735   10/11/13 1715  piperacillin-tazobactam (ZOSYN) IVPB 3.375 g     3.375 g 100 mL/hr over 30 Minutes Intravenous  Once 10/11/13 1700 10/11/13 1803          Subjective:   Francisco Johnston -unable to obtain objective.   Objective:   Filed Vitals:   10/12/13 1123 10/12/13 1345 10/12/13 2117 10/13/13 0514  BP: 87/65 122/64 126/60 125/52  Pulse: 115 114 93 90  Temp:  98.2 F (36.8 C) 99.5 F (37.5 C) 99.2 F (37.3 C)  TempSrc:  Oral Oral Oral  Resp:  20 18 18   Height:      Weight:      SpO2:  96% 93% 93%    Wt Readings from Last 3 Encounters:  10/12/13 61.9 kg (136 lb 7.4 oz)  08/07/13 69.1 kg (152 lb 5.4 oz)  04/05/13 72.122 kg (159 lb)     Intake/Output Summary (Last 24 hours) at 10/13/13 0757 Last data filed at 10/13/13 0500  Gross per 24 hour  Intake 2939.86 ml  Output    727 ml  Net 2212.86 ml    Exam Aphasic, Awake, able to node head and follows with gaze Supple Neck,No JVD, No cervical lymphadenopathy appriciated.  Symmetrical Chest wall movement, Good air movement bilaterally  Irregular rate and rhythm, No Gallops,Rubs or new Murmurs, No Parasternal Heave +B.Sounds, Abd Soft, Non tender, No organomegaly appriciated, No rebound - guarding or rigidity. No Cyanosis, Clubbing or edema, No new Rash or bruise unable to move right arm and right leg. Muscle tone on the left side intact.    Data Review   Micro Results Recent Results (from the past 240 hour(s))  MRSA PCR SCREENING     Status: Abnormal   Collection Time    10/11/13  8:07 PM      Result Value Range Status   MRSA by PCR POSITIVE (*) NEGATIVE Final   Comment:            The GeneXpert MRSA Assay (FDA     approved for NASAL specimens      only), is one component of a     comprehensive  MRSA colonization     surveillance program. It is not     intended to diagnose MRSA     infection nor to guide or     monitor treatment for     MRSA infections.     RESULT CALLED TO, READ BACK BY AND VERIFIED WITH:     ELANEY BY TCLEVELAND 10/12/13 AT 12:28AM  CULTURE, BLOOD (ROUTINE X 2)     Status: None   Collection Time    10/11/13  9:50 PM      Result Value Range Status   Specimen Description BLOOD LEFT HAND   Final   Special Requests BOTTLES DRAWN AEROBIC ONLY 5CC   Final   Culture  Setup Time     Final   Value: 10/12/2013 01:56     Performed at [facility]   Culture     Final   Value:        BLOOD CULTURE RECEIVED NO GROWTH TO DATE CULTURE WILL BE HELD FOR 5 DAYS BEFORE ISSUING A FINAL NEGATIVE REPORT     Performed at [facility]   Report Status PENDING   Incomplete  CULTURE, BLOOD (ROUTINE X 2)     Status: None   Collection Time    10/11/13 10:05 PM      Result Value Range Status   Specimen Description BLOOD LEFT HAND   Final   Special Requests BOTTLES DRAWN AEROBIC ONLY 5CC   Final   Culture  Setup Time     Final   Value: 10/12/2013 01:57     Performed at [facility]   Culture     Final   Value:        BLOOD CULTURE RECEIVED NO GROWTH TO DATE CULTURE WILL BE HELD FOR 5 DAYS BEFORE ISSUING A FINAL NEGATIVE REPORT     Performed at [facility]   Report Status PENDING   Incomplete    Radiology Reports Dg Chest 2 View  10/11/2013   CLINICAL DATA:  Altered mental status.  EXAM: CHEST  2 VIEW  COMPARISON:  08/08/2013.  FINDINGS: There is left upper lobe airspace disease. There is no pleural effusion or pneumothorax. The heart size is normal. The osseous structures are unremarkable.  IMPRESSION: Left upper lobe pneumonia. Recommend followup radiography in 4-6 weeks, to document complete resolution following adequate medical therapy. If there is not complete resolution, then recommend further  evaluation with CT of the chest to exclude underlying pathology.   Electronically Signed   By: Elige Ko   On: 10/11/2013 15:06   Ct Head Wo Contrast  10/11/2013   CLINICAL DATA:  Slurred speech. Altered mental status.  EXAM: CT HEAD WITHOUT CONTRAST  TECHNIQUE: Contiguous axial images were obtained from the base of the skull through the vertex without intravenous contrast.  COMPARISON:  CT 08/08/2013  FINDINGS: Acute hemorrhage in the left parietal white matter on the prior study has resolved. There is a 2 cm hypodensity in this area. No acute hemorrhage.  Moderate to advanced atrophy. Negative for hydrocephalus. Chronic microvascular ischemic change in the white matter. Negative for acute infarct or mass.  IMPRESSION: Left parietal white matter hypodensity consistent with resolving hematoma compared with the prior study.  Extensive atrophy which has progressed from the prior study. No acute infarct or hemorrhage.   Electronically Signed   By: Marlan Palau M.D.   On: 10/11/2013 16:40    CBC  Recent Labs Lab 10/11/13 1700 10/11/13 2204 10/12/13 0500  10/13/13 0317  WBC 16.4* 13.9* 13.1* 12.3*  HGB 10.8* 10.9* 11.4* 10.4*  HCT 33.1* 34.4* 35.4* 32.1*  PLT 175 153 156 142*  MCV 90.4 92.0 90.8 90.4  MCH 29.5 29.1 29.2 29.3  MCHC 32.6 31.7 32.2 32.4  RDW 15.9* 16.0* 15.9* 15.8*  LYMPHSABS 1.3  --   --   --   MONOABS 1.3*  --   --   --   EOSABS 0.1  --   --   --   BASOSABS 0.0  --   --   --     Chemistries   Recent Labs Lab 10/11/13 1700 10/11/13 2204 10/12/13 0500 10/13/13 0317  NA 147*  --  153* 146*  K 4.4  --  4.2 3.1*  CL 114*  --  118* 113*  CO2 23  --  24 24  GLUCOSE 179*  --  100* 113*  BUN 24*  --  20 15  CREATININE 0.75 0.83 0.80 0.81  CALCIUM 7.9*  --  8.4 7.6*  MG  --   --   --  2.2  AST 27  --   --   --   ALT 14  --   --   --   ALKPHOS 52  --   --   --   BILITOT 0.6  --   --   --     ------------------------------------------------------------------------------------------------------------------ estimated creatinine clearance is 67.9 ml/min (by C-G formula based on Cr of 0.81). ------------------------------------------------------------------------------------------------------------------  Coagulation profile  Recent Labs Lab 10/11/13 1700  INR 1.39    Cardiac Enzymes  Recent Labs Lab 10/12/13 0500 10/12/13 0944 10/12/13 1521  TROPONINI <0.30 <0.30 <0.30   ------------------------------------------------------------------------------------------------------------------     Time Spent in minutes   45   Conte, Tessa PA-S on 10/13/2013 at 7:57 AM  Between 7am to 7pm - Pager - [phone]  After 7pm go to [email or web address] - password [email or web address]  And look for the night coverage person covering for me after hours  [facility] Office  [phone]

## 2013-10-13 NOTE — Evaluation (Signed)
Physical Therapy Evaluation Patient Details Name: Francisco Johnston MRN: [id number] DOB: [birth year] Today's Date: 10/13/2013 Time: 4098-1191 PT Time Calculation (min): 25 min  PT Assessment / Plan / Recommendation History of Present Illness  Patient is a 79 y/o male admitted from [facility] with LUL pneumonia.  Recent left frontal ICh with right paresis and seizure disorder.  Clinical Impression  Patient presents with decreased independence due to deficits listed below and will benefit from skilled PT in the acute setting to minimize side effects of immobility and decrease burden of care.    PT Assessment  Patient needs continued PT services    Follow Up Recommendations  SNF    Does the patient have the potential to tolerate intense rehabilitation    No  Barriers to Discharge  None      Equipment Recommendations  None recommended by PT    Recommendations for Other Services   None  Frequency Min 2X/week    Precautions / Restrictions Precautions Precautions: Fall Precaution Comments: Orange Isolation precautions Restrictions Weight Bearing Restrictions: No   Pertinent Vitals/Pain C/o pain with PROM right UE      Mobility  Bed Mobility Bed Mobility: Supine to Sit;Sitting - Scoot to [place] of Bed;Sit to Supine;Scooting to [facility] Supine to Sit: 2: Max assist;HOB flat;With rails Sitting - Scoot to Edge of Bed: 2: Max assist Sit to Supine: 1: +1 Total assist Scooting to HOB: 1: +1 Total assist Details for Bed Mobility Assistance: pt helped to sit bringing left leg off bed and pulling up with left hand, but leans to right in sitting; to supine needed assist with both legs and to manage trunk Transfers Transfers: Not assessed    Exercises General Exercises - Upper Extremity Shoulder Flexion: PROM;Right;10 reps;Supine Elbow Extension: PROM;Right;10 reps;Supine General Exercises - Lower Extremity Heel Slides: AAROM;Right;5 reps;Supine   PT Diagnosis: Hemiplegia dominant  side;Generalized weakness  PT Problem List: Decreased range of motion;Decreased strength;Decreased activity tolerance;Decreased balance;Decreased mobility;Decreased coordination;Impaired tone PT Treatment Interventions: DME instruction;Balance training;Functional mobility training;Patient/family education;Therapeutic activities;Wheelchair mobility training;Therapeutic exercise     PT Goals(Current goals can be found in the care plan section) Acute Rehab PT Goals Patient Stated Goal: Unable to state PT Goal Formulation: Patient unable to participate in goal setting Time For Goal Achievement: 10/26/2013 Potential to Achieve Goals: Fair  Visit Information  Last PT Received On: 10/13/13 History of Present Illness: Patient is a 79 y/o male admitted from [facility] with LUL pneumonia.  Recent left frontal ICh with right paresis and seizure disorder.       Prior Functioning  Home Living Family/patient expects to be discharged to:: Skilled nursing facility Prior Function Level of Independence: Needs assistance Communication Communication: Expressive difficulties;Receptive difficulties    Cognition  Cognition Arousal/Alertness: Lethargic Behavior During Therapy: Flat affect Overall Cognitive Status: Difficult to assess Difficult to assess due to: Impaired communication    Extremity/Trunk Assessment Upper Extremity Assessment Upper Extremity Assessment: RUE deficits/detail;LUE deficits/detail RUE Deficits / Details: PROM limited due to increased flexor tone at elbow kept in pronation and tight at shoulder unable to lift greater than 80 degrees due to pain with internal rotation and adduction at rest.  LUE Deficits / Details: Lifts antigravity up to about 120 degrees shoulder flexion Lower Extremity Assessment Lower Extremity Assessment: RLE deficits/detail;LLE deficits/detail RLE Deficits / Details: moves with assist reflexively into flexion and demonstrates limited antigravity  strength, negative for clonus LLE Deficits / Details: Moves independently in bed Cervical / Trunk Assessment Cervical / Trunk  Assessment: Other exceptions Cervical / Trunk Exceptions: tight on right lateral flexors and with extensor tone in sitting   Balance Balance Balance Assessed: Yes Static Sitting Balance Static Sitting - Balance Support: Left upper extremity supported;Feet supported Static Sitting - Level of Assistance: 3: Mod assist;2: Max assist Static Sitting - Comment/# of Minutes: Initially needed max assist to sit edge of bed due to leaning hard right, able to decrease assist while sitting with less support given and cues to lean to me.  Then pt fatigues and increases leaning right; sat about 8 minutes  End of Session PT - End of Session Activity Tolerance: Patient limited by fatigue Patient left: in bed;with call bell/phone within reach  GP     [facility] 10/13/2013, 5:18 PM [place], [place] [phone] 10/13/2013

## 2013-10-13 NOTE — Progress Notes (Signed)
PT Cancellation Note  Patient Details Name: Francisco Johnston MRN: [id number] DOB: [birth year]   Cancelled Treatment:    Reason Eval/Treat Not Completed: Patient not medically ready; patient to be transferred to step down due to continuing to jump out of rhythm per RN.  Will attempt later this pm as time allows.   Sayuri Rhames,CYNDI 10/13/2013, 12:45 PM

## 2013-10-13 NOTE — Progress Notes (Signed)
Subjective:  Patient awake in no acute distress. Converted back into atrial fibrillation with RVR. Patient received a of less than total of 5 mg with control of heart rate in low 100. Potassium is being replaced  Objective:  Vital Signs in the last 24 hours: Temp:  [98.2 F (36.8 C)-99.5 F (37.5 C)] 99.2 F (37.3 C) (11/07 0514) Pulse Rate:  [90-115] 90 (11/07 0514) Resp:  [18-20] 18 (11/07 0514) BP: (87-126)/(52-65) 125/52 mmHg (11/07 0514) SpO2:  [93 %-96 %] 93 % (11/07 0514)  Intake/Output from previous day: 11/06 0701 - 11/07 0700 In: 2939.9 [P.O.:280; I.V.:1209.9; IV Piggyback:450] Out: 727 [Urine:725; Stool:2] Intake/Output from this shift:    Physical Exam: Neck: no adenopathy, no carotid bruit, no JVD and supple, symmetrical, trachea midline Lungs: Decreased breath sound at bases with left lung rhonchi Heart: irregularly irregular rhythm, S1, S2 normal and Soft systolic murmur noted Abdomen: soft, non-tender; bowel sounds normal; no masses,  no organomegaly Extremities: extremities normal, atraumatic, no cyanosis or edema  Lab Results:  Recent Labs  10/12/13 0500 10/13/13 0317  WBC 13.1* 12.3*  HGB 11.4* 10.4*  PLT 156 142*    Recent Labs  10/12/13 0500 10/13/13 0317  NA 153* 146*  K 4.2 3.1*  CL 118* 113*  CO2 24 24  GLUCOSE 100* 113*  BUN 20 15  CREATININE 0.80 0.81    Recent Labs  10/12/13 0944 10/12/13 1521  TROPONINI <0.30 <0.30   Hepatic Function Panel  Recent Labs  10/11/13 1700  PROT 7.2  ALBUMIN 1.6*  AST 27  ALT 14  ALKPHOS 52  BILITOT 0.6   No results found for this basename: CHOL,  in the last 72 hours No results found for this basename: PROTIME,  in the last 72 hours  Imaging: Imaging results have been reviewed and Dg Chest 2 View  10/11/2013   CLINICAL DATA:  Altered mental status.  EXAM: CHEST  2 VIEW  COMPARISON:  08/08/2013.  FINDINGS: There is left upper lobe airspace disease. There is no pleural effusion or  pneumothorax. The heart size is normal. The osseous structures are unremarkable.  IMPRESSION: Left upper lobe pneumonia. Recommend followup radiography in 4-6 weeks, to document complete resolution following adequate medical therapy. If there is not complete resolution, then recommend further evaluation with CT of the chest to exclude underlying pathology.   Electronically Signed   By: Elige Ko   On: 10/11/2013 15:06   Ct Head Wo Contrast  10/11/2013   CLINICAL DATA:  Slurred speech. Altered mental status.  EXAM: CT HEAD WITHOUT CONTRAST  TECHNIQUE: Contiguous axial images were obtained from the base of the skull through the vertex without intravenous contrast.  COMPARISON:  CT 08/08/2013  FINDINGS: Acute hemorrhage in the left parietal white matter on the prior study has resolved. There is a 2 cm hypodensity in this area. No acute hemorrhage.  Moderate to advanced atrophy. Negative for hydrocephalus. Chronic microvascular ischemic change in the white matter. Negative for acute infarct or mass.  IMPRESSION: Left parietal white matter hypodensity consistent with resolving hematoma compared with the prior study.  Extensive atrophy which has progressed from the prior study. No acute infarct or hemorrhage.   Electronically Signed   By: Marlan Palau M.D.   On: 10/11/2013 16:40    Cardiac Studies:  Assessment/Plan:  Paroxysmal recurrent A. fib with RVR  Hypertension  Coronary artery disease history of questionable septal MI in the past  Status post hemorrhagic left frontoparietal CVA with right  paresis  Left upper lobe pneumonia  Glucose intolerance  Seizure disorder  History of adenocarcinoma of colon  Failure to thrive Plan IV Lopressor as per orders Replace K. May need IV Cardizem if rate not well controlled Switch IV amiodarone to by mouth as per orders Monitor QTC while on amiodarone Dr. Algie Coffer on-call for weekend  LOS: 2 days    [facility] N 10/13/2013, 7:58 AM

## 2013-10-14 ENCOUNTER — Inpatient Hospital Stay (HOSPITAL_COMMUNITY): Payer: Medicare Other

## 2013-10-14 DIAGNOSIS — A419 Sepsis, unspecified organism: Secondary | ICD-10-CM

## 2013-10-14 DIAGNOSIS — I259 Chronic ischemic heart disease, unspecified: Secondary | ICD-10-CM

## 2013-10-14 DIAGNOSIS — J690 Pneumonitis due to inhalation of food and vomit: Secondary | ICD-10-CM

## 2013-10-14 DIAGNOSIS — N179 Acute kidney failure, unspecified: Secondary | ICD-10-CM

## 2013-10-14 DIAGNOSIS — E43 Unspecified severe protein-calorie malnutrition: Secondary | ICD-10-CM

## 2013-10-14 DIAGNOSIS — I4891 Unspecified atrial fibrillation: Secondary | ICD-10-CM

## 2013-10-14 LAB — URINALYSIS W MICROSCOPIC + REFLEX CULTURE
Hgb urine dipstick: NEGATIVE
Ketones, ur: 15 mg/dL — AB
Leukocytes, UA: NEGATIVE
Nitrite: NEGATIVE
Specific Gravity, Urine: 1.039 — ABNORMAL HIGH (ref 1.005–1.030)
Urobilinogen, UA: 1 mg/dL (ref 0.0–1.0)

## 2013-10-14 LAB — CREATININE, URINE, RANDOM: Creatinine, Urine: 210.28 mg/dL

## 2013-10-14 LAB — OSMOLALITY, URINE: Osmolality, Ur: 650 mOsm/kg (ref 390–1090)

## 2013-10-14 LAB — BASIC METABOLIC PANEL
BUN: 19 mg/dL (ref 6–23)
Chloride: 91 mEq/L — ABNORMAL LOW (ref 96–112)
Creatinine, Ser: 3.26 mg/dL — ABNORMAL HIGH (ref 0.50–1.35)
GFR calc Af Amer: 20 mL/min — ABNORMAL LOW (ref >90)
Sodium: 132 mEq/L — ABNORMAL LOW (ref 135–145)

## 2013-10-14 LAB — GLUCOSE, CAPILLARY
Glucose-Capillary: 130 mg/dL — ABNORMAL HIGH (ref 70–99)
Glucose-Capillary: 140 mg/dL — ABNORMAL HIGH (ref 70–99)

## 2013-10-14 LAB — SEDIMENTATION RATE: Sed Rate: 108 mm/hr — ABNORMAL HIGH (ref 0–16)

## 2013-10-14 LAB — PROCALCITONIN: Procalcitonin: 0.42 ng/mL

## 2013-10-14 LAB — OSMOLALITY: Osmolality: 296 mOsm/kg (ref 275–300)

## 2013-10-14 MED ORDER — VANCOMYCIN HCL IN DEXTROSE 750-5 MG/150ML-% IV SOLN: 750.0000 mg | INTRAVENOUS | Status: DC

## 2013-10-14 MED ORDER — VALPROIC ACID 250 MG/5ML PO SYRP
750.0000 mg | ORAL_SOLUTION | Freq: Two times a day (BID) | ORAL | Status: DC
  Administered 2013-10-14 – 2013-10-23 (×18): 750 mg
  Filled 2013-10-14 (×19): qty 15

## 2013-10-14 MED ORDER — PRO-STAT SUGAR FREE PO LIQD
30.0000 mL | Freq: Two times a day (BID) | ORAL | Status: DC
  Filled 2013-10-14 (×2): qty 30

## 2013-10-14 MED ORDER — AMIODARONE HCL 200 MG PO TABS
200.0000 mg | ORAL_TABLET | Freq: Two times a day (BID) | ORAL | Status: DC
  Administered 2013-10-14 – 2013-10-23 (×18): 200 mg
  Filled 2013-10-14 (×20): qty 1

## 2013-10-14 MED ORDER — GUAIFENESIN 100 MG/5ML PO SYRP
600.0000 mg | ORAL_SOLUTION | Freq: Two times a day (BID) | ORAL | Status: DC
  Administered 2013-10-14 – 2013-10-23 (×18): 600 mg
  Filled 2013-10-14 (×21): qty 30

## 2013-10-14 MED ORDER — JEVITY 1.2 CAL PO LIQD
1000.0000 mL | ORAL | Status: DC
  Filled 2013-10-14 (×2): qty 1000

## 2013-10-14 MED ORDER — LINEZOLID 2 MG/ML IV SOLN
600.0000 mg | Freq: Two times a day (BID) | INTRAVENOUS | Status: DC
  Administered 2013-10-14 – 2013-10-19 (×12): 600 mg via INTRAVENOUS
  Filled 2013-10-14 (×15): qty 300

## 2013-10-14 MED ORDER — DILTIAZEM 12 MG/ML ORAL SUSPENSION
120.0000 mg | Freq: Three times a day (TID) | ORAL | Status: DC
  Administered 2013-10-14 – 2013-10-23 (×28): 120 mg
  Filled 2013-10-14 (×34): qty 12

## 2013-10-14 MED ORDER — SODIUM CHLORIDE 0.9 % IV SOLN
INTRAVENOUS | Status: DC
  Administered 2013-10-14 (×2): via INTRAVENOUS

## 2013-10-14 MED ORDER — AZITHROMYCIN 500 MG IV SOLR
500.0000 mg | INTRAVENOUS | Status: DC
  Filled 2013-10-14: qty 500

## 2013-10-14 MED ORDER — ASPIRIN 81 MG PO CHEW
81.0000 mg | CHEWABLE_TABLET | Freq: Every day | ORAL | Status: DC
  Administered 2013-10-15 – 2013-10-23 (×9): 81 mg
  Filled 2013-10-14 (×10): qty 1

## 2013-10-14 MED ORDER — JEVITY 1.2 CAL PO LIQD
1000.0000 mL | ORAL | Status: DC
Start: 2013-10-14 — End: 2013-10-14
  Filled 2013-10-14 (×2): qty 1000

## 2013-10-14 MED ORDER — PIPERACILLIN-TAZOBACTAM IN DEX 2-0.25 GM/50ML IV SOLN
2.2500 g | Freq: Four times a day (QID) | INTRAVENOUS | Status: DC
  Administered 2013-10-14 – 2013-10-15 (×4): 2.25 g via INTRAVENOUS
  Filled 2013-10-14 (×9): qty 50

## 2013-10-14 MED ORDER — PANTOPRAZOLE SODIUM 40 MG PO PACK
40.0000 mg | PACK | Freq: Every day | ORAL | Status: DC
  Administered 2013-10-15 – 2013-10-23 (×9): 40 mg
  Filled 2013-10-14 (×12): qty 20

## 2013-10-14 NOTE — Consult Note (Signed)
PULMONARY  / CRITICAL CARE MEDICINE  Name: Francisco Johnston MRN: [id number] DOB: August 21, 1934    ADMISSION DATE:  10/11/2013 CONSULTATION DATE:  11/8  REFERRING MD :  Thedore Mins PRIMARY SERVICE: [facility]  CHIEF COMPLAINT:  pneumonia  BRIEF PATIENT DESCRIPTION:  This is a 79 year old male who resides at an SNF requiring assist w/ ADLs s/p hemorrhagic stroke w/ residual right sided hemiparesis, dysphagia and aphagia (august 2014). Admitted on 11/5 w/ HCAP vs aspiration, treated w/ abx. Course c/b AF w/ RVR, and progressive renal failure. PCCM asked to see on 11/8 for worsening left sided PNA.   SIGNIFICANT EVENTS / STUDIES:  11/8 RUS: neg for hydro 11/6 ECHO: NML EF, 55-65%, normal wall motion   LINES / TUBES:  CULTURES: BCX2 11/5>>> MRSA screen 11/5: positive    ANTIBIOTICS: vanc 11/5>>>11/8 zyvox 11/8>>> Zosyn 11/5>>> Azith X 1 11/8   HISTORY OF PRESENT ILLNESS:    79 year old African American male who had a hemorrhagic CVA causing right-sided hemiparesis, dysphagia and aphasia few months ago, he was since living at a nursing home, came in on 10/11/2013 from nursing home for fevers, lethargy and workup was suggestive of HCAP, he was started on vancomycin and Zosyn upon admissions, cultures are negative to date, on the night of admission he developed atrial fibrillation with RVR, cardiology was consulted, he required digoxin-amiodarone-Cardizem-Lopressor to control his rate, he finally converted to sinus, he was transiently hypotensive due to sustained tachycardia. On day 3 he has developed acute renal failure, despite antibiotics his left-sided infiltrate is much worse and he is febrile again. Situation d/w son re: prognosis is poor, however he wants aggressive care, pulmonary critical care has been consulted for further input.   PAST MEDICAL HISTORY :  Past Medical History  Diagnosis Date  . Heart disease   . Hypertension   . Iron deficiency anemia 06/12/2011  . CAD (coronary artery  disease)   . Noncompliance   . ED (erectile dysfunction)   . IFG (impaired fasting glucose)   . Lacunar stroke   . Myocardial infarction 1980's X2  . Prostate cancer 1990's    "took radiation tx for that" (10/11/2013)  . Adenocarcinoma of colon 06/12/2011  . Pneumonia     "maybe now, never before" (10/11/2013)  . Stroke 08/06/2013    "paralyzed on the right side since" (10/11/2013)  . Arthritis     "probably; joints" (10/11/2013)  . Chronic kidney disease     "one kidney's smaller than the other; been like that forever" (10/11/2013)   Past Surgical History  Procedure Laterality Date  . Colonoscopy    . Colectomy  ~ 2011  . Cataract extraction Right   . Ankle fracture surgery Left 1990's    "put pins in it" (10/11/2013)   Prior to Admission medications   Medication Sig Start Date End Date Taking? Authorizing Provider  amLODipine (NORVASC) 5 MG tablet Take 1 tablet (5 mg total) by mouth daily. 08/15/13  Yes Cathlyn Parsons, PA-C  aspirin 81 MG chewable tablet Chew 1 tablet (81 mg total) by mouth daily. 08/16/13  Yes Cathlyn Parsons, PA-C  divalproex (DEPAKOTE SPRINKLE) 125 MG capsule Take 750 mg by mouth 2 (two) times daily.   Yes Historical Provider, MD  guaiFENesin (MUCINEX) 600 MG 12 hr tablet Take 600 mg by mouth 2 (two) times daily. 10/11/13  Yes Historical Provider, MD  metoprolol tartrate (LOPRESSOR) 25 MG tablet Take 1 tablet (25 mg total) by mouth 2 (two) times daily. 08/15/13  Yes Cathlyn Parsons, PA-C  pantoprazole (PROTONIX) 40 MG tablet Take 40 mg by mouth daily.   Yes Historical Provider, MD  potassium chloride SA (K-DUR,KLOR-CON) 20 MEQ tablet Take 2 tablets (40 mEq total) by mouth daily. 08/15/13  Yes Cathlyn Parsons, PA-C   No Known Allergies  FAMILY HISTORY:  Family History  Problem Relation Age of Onset  . Cancer Father   . Cancer Sister    SOCIAL HISTORY:  reports that he has never smoked. He quit smokeless tobacco use about 2 months ago. His smokeless tobacco use included Snuff. He  reports that he drinks alcohol. He reports that he does not use illicit drugs.  REVIEW OF SYSTEMS:   unable  SUBJECTIVE:  No distress  VITAL SIGNS: Temp:  [98.7 F (37.1 C)-100.6 F (38.1 C)] 100.6 F (38.1 C) (11/08 0832) Pulse Rate:  [72-91] 77 (11/08 0832) Resp:  [18-32] 22 (11/08 0832) BP: (101-146)/(32-100) 146/58 mmHg (11/08 0832) SpO2:  [87 %-98 %] 87 % (11/08 0832) 2 liters HEMODYNAMICS:   VENTILATOR SETTINGS:   INTAKE / OUTPUT: Intake/Output     11/07 0701 - 11/08 0700 11/08 0701 - 11/09 0700   P.O.     I.V. (mL/kg) 1683.8 (27.2)    Other     IV Piggyback 600    Total Intake(mL/kg) 2283.8 (36.9)    Urine (mL/kg/hr) 1100 (0.7)    Stool 1 (0)    Total Output 1101     Net +1182.8          Stool Occurrence 1 x      PHYSICAL EXAMINATION: General:  Chronically ill appearing AAM, not in acute distress.  Neuro:  Awake, follows commands, aphasic, right sided hemiplegia.  HEENT:  Tongue deviates to right no JVD, edentulous  Cardiovascular:  rrr Lungs:  Scattered rhonchi left > right  Abdomen: soft, + bowel sounds Musculoskeletal:  Intact  Skin:  Intact   LABS:  CBC Recent Labs     10/11/13  2204  10/12/13  0500  10/13/13  0317  WBC  13.9*  13.1*  12.3*  HGB  10.9*  11.4*  10.4*  HCT  34.4*  35.4*  32.1*  PLT  153  156  142*   Coag's Recent Labs     10/11/13  1700  APTT  32  INR  1.39   BMET Recent Labs     10/12/13  0500  10/13/13  0317  10/14/13  0450  NA  153*  146*  132*  K  4.2  3.1*  3.8  CL  118*  113*  91*  CO2  24  24  29   BUN  20  15  19   CREATININE  0.80  0.81  3.26*  GLUCOSE  100*  113*  236*   Electrolytes Recent Labs     10/12/13  0500  10/13/13  0317  10/14/13  0450  CALCIUM  8.4  7.6*  9.3  MG   --   2.2  2.0   Sepsis Markers No results found for this basename: LACTICACIDVEN, PROCALCITON, O2SATVEN,  in the last 72 hours ABG No results found for this basename: PHART, PCO2ART, PO2ART,  in the last 72 hours Liver  Enzymes Recent Labs     10/11/13  1700  AST  27  ALT  14  ALKPHOS  52  BILITOT  0.6  ALBUMIN  1.6*   Cardiac Enzymes Recent Labs     10/12/13  0500  10/12/13  0944  10/12/13  1521  TROPONINI  <0.30  <0.30  <0.30   Glucose Recent Labs     10/11/13  1510  10/11/13  1729  10/11/13  1852  GLUCAP  61*  123*  121*    Imaging US Renal  10/14/2013   CLINICAL DATA:  Acute renal failure  EXAM: RENAL/URINARY TRACT ULTRASOUND COMPLETE  COMPARISON:  None.  FINDINGS: Right Kidney  Length: 9.9. Echogenicity within normal limits. No mass or hydronephrosis visualized.  Left Kidney  Length: 10.5. Echogenicity within normal limits. No mass or hydronephrosis visualized.  Bladder  Decompressed by indwelling Foley catheter.  Additional comments: Left pleural effusion.  IMPRESSION: No hydronephrosis.  Bladder decompressed by indwelling Foley catheter.   Electronically Signed   By: Charline Bills M.D.   On: 10/14/2013 09:51   Dg Chest Port 1 View  10/14/2013   CLINICAL DATA:  Shortness of breath.  EXAM: PORTABLE CHEST - 1 VIEW  COMPARISON:  10/11/2013  FINDINGS: Significant worsening of left lung pneumonia is noted by chest x-ray now involving essentially the entire left lung. No overt edema or significant associated pleural fluid is identified. The heart size and mediastinal contours are stable.  IMPRESSION: Significant worsening of left lung pneumonia.   Electronically Signed   By: Irish Lack M.D.   On: 10/14/2013 08:14     CXR:  Progression of left sided airspace disease, and mild worsening of right   ASSESSMENT / PLAN:  PULMONARY A: HCAP vs aspiration  Hypoxia  >in setting of progressive bilateral airspace disease.  >favor aspiration and possibly mild evolving ALI  -less likely amiodarone related but will keep on diff dx  P:   Needs to be NPO Consider PEG if long term support needed Wean FIO2 See ID section  Ck PCT Ck ESR  CARDIOVASCULAR A:  AF-->now in NS CAD P:   Cont current meds Cont tele   RENAL A:   Acute renal failure  Likely this is medication related. RUS neg   P:   F/u Una and Ucr Change vanc to zyvox (in setting of renal failure)   GASTROINTESTINAL A:   Dysphagia  P:   NPO Place FT  HEMATOLOGIC A:   Anemia of chronic disease.   P:  [place] heparin  Trend CBC  INFECTIOUS A:   Aspiration vs HCAP Progression of Left > right airspace disease.  P:   HCAP coverage Change vanc to zyvox.  Trend PCT   ENDOCRINE A:   DM, hyperglycemia  P:   ssi  NEUROLOGIC A:  Prior hemorrhagic CVA      Deconditioning       Right sided hemiplegia      Dysphagia and aphasia  P:   Supportive care NPO Place FT   TODAY'S SUMMARY:  Chronic aspiration, possibly mild ALI. Worsening renal fxn... May be vanc related. Will change vanc to zyvox. No need for azith here. Make him NPO, place FT. He will likely need PEG.   I have personally obtained a history, examined the patient, evaluated laboratory and imaging results, formulated the assessment and plan and placed orders. CRITICAL CARE: The patient is critically ill with multiple organ systems failure and requires high complexity decision making for assessment and support, frequent evaluation and titration of therapies, application of advanced monitoring technologies and extensive interpretation of multiple databases. Critical Care Time devoted to patient care services described in this note is 40 minutes.   Dorcas Carrow Beeper  [phone]  Cell  [phone]  If no response or cell goes to voicemail, call beeper [phone]  Pulmonary and Critical Care Medicine [facility] Pager: [phone]  10/14/2013, 9:56 AM

## 2013-10-14 NOTE — Progress Notes (Signed)
NUTRITION FOLLOW UP  Intervention:    1. Initiate Jevity 1.2 @ 20 ml/hr and increase by 10 ml every 12 hours to goal rate of 60 ml/hr.  2. 30 ml Prostat BID  At goal rate, tube feeding regimen will provide 1928 kcal, 110 grams of protein, and 1167 ml of H2O.   2. Monitor magnesium, potassium, and phosphorus daily for at least 3 days, MD to replete as needed, as pt is at risk for refeeding syndrome given severe malnutrition.   Nutrition Dx:   Malnutrition related to inadequate oral intake as evidenced by severe depletion of muscle mass and subcutaneous fat mass with 11% weight loss within the past month; ongoing.   Goal:  Intake to meet >90% of estimated nutrition needs, not met.   Monitor:  TF initiation/tolerance, labs, weight trend.  Assessment:   Patient with a past medical history of left frontal hemorrhagic CVA, discharged from the stroke service on 08/06/2013 to skilled nursing facility; was transferred to the ED from his SNF on 11/5 with complaints of overall functional decline/failure to thrive. Severe malnutrition identified by RD on admission.   Per MD likely aspiration PNA, pt needs to be NPO.  Consult received to start enteral nutrition.   Height: Ht Readings from Last 1 Encounters:  10/12/13 6\' 2"  (1.88 m)    Weight Status:   Wt Readings from Last 1 Encounters:  10/12/13 136 lb 7.4 oz (61.9 kg)  Admission weight 129 lb (58.9 kg)  Re-estimated needs:  Kcal: 1900-2100  Protein: 100-120 gm  Fluid: 1.9-2.1 L  Skin: no issues noted  Diet Order: NPO   Intake/Output Summary (Last 24 hours) at 10/14/13 1442 Last data filed at 10/14/13 1242  Gross per 24 hour  Intake 2833.75 ml  Output   1060 ml  Net 1773.75 ml    Last BM: 11/7   Labs:   Recent Labs Lab 10/12/13 0500 10/13/13 0317 10/14/13 0450  NA 153* 146* 132*  K 4.2 3.1* 3.8  CL 118* 113* 91*  CO2 24 24 29   BUN 20 15 19   CREATININE 0.80 0.81 3.26*  CALCIUM 8.4 7.6* 9.3  MG  --  2.2  2.0  GLUCOSE 100* 113* 236*    CBG (last 3)   Recent Labs  10/11/13 1729 10/11/13 1852 10/14/13 1308  GLUCAP 123* 121* 130*    Scheduled Meds: . amiodarone  200 mg Per Tube BID  . antiseptic oral rinse  15 mL Mouth Rinse q12n4p  . [START ON 10/15/2013] aspirin  81 mg Per Tube Daily  . chlorhexidine  15 mL Mouth Rinse BID  . Chlorhexidine Gluconate Cloth  6 each Topical Q0600  . diltiazem  120 mg Per Tube Q8H  . feeding supplement (ENSURE)  1 Container Oral TID BM  . guaifenesin  600 mg Per Tube BID  . linezolid  600 mg Intravenous Q12H  . mupirocin ointment  1 application Nasal BID  . [START ON 10/15/2013] pantoprazole sodium  40 mg Per Tube Daily  . piperacillin-tazobactam (ZOSYN)  IV  2.25 g Intravenous Q6H  . sodium chloride  3 mL Intravenous Q12H  . Valproic Acid  750 mg Per Tube BID    Continuous Infusions: . sodium chloride 100 mL/hr at 10/14/13 0900  . feeding supplement (JEVITY 1.2 CAL)      Kendell Bane RD, LDN, CNSC [phone] Pager [phone] After Hours Pager

## 2013-10-14 NOTE — Progress Notes (Signed)
Subjective:  Awake eye contact but but not talking. Monitor Sinus rhythm with frequent APCs. T max 100.6. Worsening pneumonia. Significant change in Lab values overnight for electrolytes, BUN and creatinine-Possible lab error  Objective:  Vital Signs in the last 24 hours: Temp:  [98.7 F (37.1 C)-100.6 F (38.1 C)] 100.6 F (38.1 C) (11/08 0832) Pulse Rate:  [52-92] 92 (11/08 1100) Cardiac Rhythm:  [-] Normal sinus rhythm (11/08 0832) Resp:  [18-37] 28 (11/08 1100) BP: (101-146)/(32-100) 135/55 mmHg (11/08 1100) SpO2:  [87 %-98 %] 91 % (11/08 1100)  Physical Exam: BP Readings from Last 1 Encounters:  10/14/13 135/55     Wt Readings from Last 1 Encounters:  10/12/13 61.9 kg (136 lb 7.4 oz)    Weight change:   HEENT: /AT, Eyes-Brown, Conjunctiva-Pale pink, Sclera-Non-icteric Neck: No JVD, No bruit, Trachea midline. Lungs:  Shallow breathing, Bilateral.  Cardiac:  Irregular rhythm, normal S1 and S2, no S3. II/VI systolic murmur. Abdomen:  Soft, non-tender. Extremities:  No edema present. No cyanosis. No clubbing. CNS: Cranial nerves grossly intact, moves all 4 extremities. Right sided weakness. Skin: Warm and dry.   Intake/Output from previous day: 11/07 0701 - 11/08 0700 In: 2283.8 [I.V.:1683.8; IV Piggyback:600] Out: 1101 [Urine:1100; Stool:1]    Lab Results: BMET    Component Value Date/Time   NA 132* 10/14/2013 0450   K 3.8 10/14/2013 0450   CL 91* 10/14/2013 0450   CO2 29 10/14/2013 0450   GLUCOSE 236* 10/14/2013 0450   BUN 19 10/14/2013 0450   CREATININE 3.26* 10/14/2013 0450   CALCIUM 9.3 10/14/2013 0450   GFRNONAA 17* 10/14/2013 0450   GFRAA 20* 10/14/2013 0450   CBC    Component Value Date/Time   WBC 12.3* 10/13/2013 0317   RBC 3.55* 10/13/2013 0317   RBC 2.70* 07/04/2010 1517   HGB 10.4* 10/13/2013 0317   HCT 32.1* 10/13/2013 0317   PLT 142* 10/13/2013 0317   MCV 90.4 10/13/2013 0317   MCH 29.3 10/13/2013 0317   MCHC 32.4 10/13/2013 0317   RDW 15.8* 10/13/2013  0317   LYMPHSABS 1.3 10/11/2013 1700   MONOABS 1.3* 10/11/2013 1700   EOSABS 0.1 10/11/2013 1700   BASOSABS 0.0 10/11/2013 1700   CARDIAC ENZYMES Lab Results  Component Value Date   CKTOTAL 77 07/04/2010   CKMB 1.4 07/04/2010   TROPONINI <0.30 10/12/2013    Scheduled Meds: . amiodarone  200 mg Per Tube BID  . antiseptic oral rinse  15 mL Mouth Rinse q12n4p  . [START ON 10/15/2013] aspirin  81 mg Per Tube Daily  . chlorhexidine  15 mL Mouth Rinse BID  . Chlorhexidine Gluconate Cloth  6 each Topical Q0600  . diltiazem  120 mg Per Tube Q8H  . feeding supplement (ENSURE)  1 Container Oral TID BM  . guaifenesin  600 mg Per Tube BID  . linezolid  600 mg Intravenous Q12H  . mupirocin ointment  1 application Nasal BID  . [START ON 10/15/2013] pantoprazole sodium  40 mg Per Tube Daily  . piperacillin-tazobactam (ZOSYN)  IV  2.25 g Intravenous Q6H  . sodium chloride  3 mL Intravenous Q12H  . Valproic Acid  750 mg Per Tube BID   Continuous Infusions: . sodium chloride 100 mL/hr at 10/14/13 0900  . feeding supplement (JEVITY 1.2 CAL)     PRN Meds:.acetaminophen, acetaminophen, alum & mag hydroxide-simeth, food thickener, metoprolol, ondansetron (ZOFRAN) IV, ondansetron, oxyCODONE, sorbitol  Assessment/Plan: Paroxysmal recurrent A. fib  Hypertension  Coronary artery disease history  of questionable septal MI in the past  Status post hemorrhagic left frontoparietal CVA with right hemiparesis  Left upper lobe pneumonia -wosening Glucose intolerance  Seizure disorder  History of adenocarcinoma of colon  Failure to thrive Possible acute renal failure v/s lab error  Continue cardiac medications.   LOS: 3 days    Orpah Cobb  MD  10/14/2013, 11:44 AM

## 2013-10-14 NOTE — Progress Notes (Signed)
Pharmacy: Vancomycin and Zosyn  78yom continues on day #4 vancomycin and zosyn for HCAP.  CXR today shows significant worsening of left lung pneumonia and he has spiked a fever to 100.6. Also noted to be in acute renal failure with sCr 0.81-->3.26 and CrCl now 3ml/min. Vancomycin dose was increased yesterday due to a subtherapeutic trough but now will need to be adjusted again, along with zosyn, given renal failure.  Vancomycin 11/5> 11/7 VT = 9.1 on 750mg  q12 - dose changed to 750mg  q8 Zosyn 11/5> 11/5 BC x 2 > ngtd  Plan: 1) Change vancomycin to 750mg  IV q48 2) Change zosyn to 2.25g IV q6 3) Follow up renal function  Louie Casa, PharmD, BCPS 10/14/2013, 10:20 AM

## 2013-10-14 NOTE — Progress Notes (Signed)
[facility]                                                                                Patient Demographics  Francisco Johnston, is a 79 y.o. male, DOB - 10-13-1934, ZOX:[id number]  Admit date - 10/11/2013   Admitting Physician Jeralyn Bennett, MD  Outpatient Primary MD for the patient is [name], MD  LOS - 3   Chief Complaint  Patient presents with  . Altered Mental Status       Summary   79 year old African American male who had a hemorrhagic CVA causing right-sided hemiparesis, dysphagia and aphasia few months ago, he was since living at a nursing home, came in on 10/11/2013 from nursing home for fevers, lethargy and workup was suggestive of HCAP, he was started on vancomycin and Zosyn upon admissions, cultures are negative to date, on the night of admission he developed atrial fibrillation with RVR, cardiology was consulted, he required digoxin-amiodarone-Cardizem-Lopressor to control his rate, he's finally converted to sinus, he was transiently hypotensive due to sustained tachycardia.  On day 3 he has developed acute renal failure, despite antibiotics his left-sided infiltrate is much worse and he is febrile again. I have discussed with her son and his prognosis is poor, however he wants aggressive care, pulmonary critical care has been consulted for further input. I have added azithromycin for atypical coverage although this is highly unlikely in the light of him being a nursing home resident for the last few months.    Assessment & Plan     Failure to thrive/functional decline from Met Encephalopathy HCAP  -Patient reportedly have a temperature 101 at his nursing home, developing chills, with lab work showing white count in the 16,000 range, and chest x-ray showing LUL  Infiltrate, since admission he is on IV vancomycin and Zosyn, date cultures are negative, followed by speech therapist, is getting feeding assistance and aspiration precautions and is on dysphagia 1  diet.  However in the morning of 10/14/2013 he looks more lethargic and sick, repeated chest x-ray which shows worsening of left-sided infiltrate. Have added azithromycin for atypical coverage however it is unlikely as he has been at the nursing facility for the last few months. Have consulted pulmonary critical care for further input if any at this time.     New onset A fib with RVR Cardiology following, on IV amiodarone drip, also post digoxin on 10/12/2013, low-dose beta blocker on board, Cardizem will be added as rate control and rhythm control still an issue , stable TSH and echogram with EF of around 60%, he is not a Candidate for anticoagulation due to recent hemorrhagic CVA, into the son clearly. Remains at risk for a new ischemic stroke.  Lab Results  Component Value Date   TSH 1.741 10/12/2013       Hypotension Due to combination of pneumonia along with A. fib with RVR. Blood pressure improved after IV fluids.      Acute renal failure  Likely due to ATN caused by hypotension during his episodes of atrial fibrillation with RVR, obtain UA with electrolytes, continue hydration with IV fluids, bedside bladder scan shows 100 cc of urine , obtain renal ultrasound,  avoid nephrotoxins, vancomycin trough has been below range and not in toxic levels. We'll repeat BMP in the morning. If creatinine not improved renal will be consulted .       History of seizures post recent hemorrhagic CVA with chronic right-sided hemiparesis and aphasia and dysphagia -valproic acid level within therapeutic range -continue his home dose of valproic acid at 750 mg by mouth twice a day. -Continue on dysphagia 1 diet with full feeding assistance and aspiration precautions, speech therapy following.     Hypernatremia  Resolved after hydration with initially D5W and now with normal saline.      Hypokalemia  replaced IV and oral       Code Status: full, D/W with patient's son.  Explained poor prognosis and poor quality of life the  Family Communication: none  Disposition Plan: inpatient    Procedures echo gram, renal ultrasound ordered, chest x-ray CT head   Consults cardiology, PCCM   Medications  Scheduled Meds: . amiodarone  200 mg Oral BID  . antiseptic oral rinse  15 mL Mouth Rinse q12n4p  . aspirin  81 mg Oral Daily  . azithromycin  500 mg Intravenous Q24H  . chlorhexidine  15 mL Mouth Rinse BID  . Chlorhexidine Gluconate Cloth  6 each Topical Q0600  . diltiazem  120 mg Oral Q8H  . divalproex  750 mg Oral BID  . feeding supplement (ENSURE)  1 Container Oral TID BM  . guaiFENesin  600 mg Oral BID  . mupirocin ointment  1 application Nasal BID  . pantoprazole  40 mg Oral Daily  . piperacillin-tazobactam (ZOSYN)  IV  3.375 g Intravenous Q8H  . sodium chloride  3 mL Intravenous Q12H  . vancomycin  750 mg Intravenous Q8H   Continuous Infusions: . sodium chloride     PRN Meds:.acetaminophen, acetaminophen, alum & mag hydroxide-simeth, food thickener, metoprolol, ondansetron (ZOFRAN) IV, ondansetron, oxyCODONE, sorbitol  DVT Prophylaxis  SCD  Lab Results  Component Value Date   PLT 142* 10/13/2013    Antibiotics  IV zosyn and IV vancomycin   Anti-infectives   Start     Dose/Rate Route Frequency Ordered Stop   10/14/13 0915  azithromycin (ZITHROMAX) 500 mg in dextrose 5 % 250 mL IVPB     500 mg 250 mL/hr over 60 Minutes Intravenous Every 24 hours 10/14/13 0910     10/14/13 0421  vancomycin (VANCOCIN) IVPB 750 mg/150 ml premix     750 mg 150 mL/hr over 60 Minutes Intravenous Every 8 hours 10/13/13 2031     10/12/13 0600  vancomycin (VANCOCIN) IVPB 750 mg/150 ml premix  Status:  Discontinued     750 mg 150 mL/hr over 60 Minutes Intravenous Every 12 hours 10/11/13 2051 10/13/13 2031   10/12/13 0400  piperacillin-tazobactam (ZOSYN) IVPB 3.375 g     3.375 g 12.5 mL/hr over 240 Minutes Intravenous 3 times per day 10/11/13 2051      10/11/13 1745  vancomycin (VANCOCIN) IVPB 1000 mg/200 mL premix     1,000 mg 200 mL/hr over 60 Minutes Intravenous  Once 10/11/13 1736 10/11/13 1902   10/11/13 1715  ciprofloxacin (CIPRO) IVPB 400 mg  Status:  Discontinued     400 mg 200 mL/hr over 60 Minutes Intravenous  Once 10/11/13 1700 10/11/13 2002   10/11/13 1715  vancomycin (VANCOCIN) 15 mg/kg in sodium chloride 0.9 % 100 mL IVPB  Status:  Discontinued     15 mg/kg 100 mL/hr over 60 Minutes Intravenous  Once 10/11/13  1700 10/11/13 1735   10/11/13 1715  piperacillin-tazobactam (ZOSYN) IVPB 3.375 g     3.375 g 100 mL/hr over 30 Minutes Intravenous  Once 10/11/13 1700 10/11/13 1803          Subjective:   Guadalupe Dawn -unable to obtain objective.   Objective:   Filed Vitals:   10/14/13 0300 10/14/13 0400 10/14/13 0500 10/14/13 0832  BP: 128/46 110/47 108/32 146/58  Pulse: 78 74 80 77  Temp:  99.7 F (37.6 C)  100.6 F (38.1 C)  TempSrc:  Oral  Oral  Resp: 25 22 21 22   Height:      Weight:      SpO2: 97% 98% 97% 87%    Wt Readings from Last 3 Encounters:  10/12/13 61.9 kg (136 lb 7.4 oz)  08/07/13 69.1 kg (152 lb 5.4 oz)  04/05/13 72.122 kg (159 lb)     Intake/Output Summary (Last 24 hours) at 10/14/13 0913 Last data filed at 10/14/13 0700  Gross per 24 hour  Intake 2283.75 ml  Output   1101 ml  Net 1182.75 ml    Exam Aphasic, Awake, able to node head and follows with gaze, chronic right-sided hemiparesis Supple Neck,No JVD, No cervical lymphadenopathy appriciated.  Symmetrical Chest wall movement, Good air movement bilaterally , oats breath sounds left more than right Irregular rate and rhythm, No Gallops,Rubs or new Murmurs, No Parasternal Heave +B.Sounds, Abd Soft, Non tender, No organomegaly appriciated, No rebound - guarding or rigidity. No Cyanosis, Clubbing or edema, No new Rash or bruise unable to move right arm and right leg. Muscle tone on the left side intact.    Data Review   Micro  Results Recent Results (from the past 240 hour(s))  MRSA PCR SCREENING     Status: Abnormal   Collection Time    10/11/13  8:07 PM      Result Value Range Status   MRSA by PCR POSITIVE (*) NEGATIVE Final   Comment:            The GeneXpert MRSA Assay (FDA     approved for NASAL specimens     only), is one component of a     comprehensive MRSA colonization     surveillance program. It is not     intended to diagnose MRSA     infection nor to guide or     monitor treatment for     MRSA infections.     RESULT CALLED TO, READ BACK BY AND VERIFIED WITH:     ELANEY BY TCLEVELAND 10/12/13 AT 12:28AM  CULTURE, BLOOD (ROUTINE X 2)     Status: None   Collection Time    10/11/13  9:50 PM      Result Value Range Status   Specimen Description BLOOD LEFT HAND   Final   Special Requests BOTTLES DRAWN AEROBIC ONLY 5CC   Final   Culture  Setup Time     Final   Value: 10/12/2013 01:56     Performed at [facility]   Culture     Final   Value:        BLOOD CULTURE RECEIVED NO GROWTH TO DATE CULTURE WILL BE HELD FOR 5 DAYS BEFORE ISSUING A FINAL NEGATIVE REPORT     Performed at [facility]   Report Status PENDING   Incomplete  CULTURE, BLOOD (ROUTINE X 2)     Status: None   Collection Time    10/11/13 10:05 PM  Result Value Range Status   Specimen Description BLOOD LEFT HAND   Final   Special Requests BOTTLES DRAWN AEROBIC ONLY 5CC   Final   Culture  Setup Time     Final   Value: 10/12/2013 01:57     Performed at [facility]   Culture     Final   Value:        BLOOD CULTURE RECEIVED NO GROWTH TO DATE CULTURE WILL BE HELD FOR 5 DAYS BEFORE ISSUING A FINAL NEGATIVE REPORT     Performed at [facility]   Report Status PENDING   Incomplete    Radiology Reports Dg Chest 2 View  10/11/2013   CLINICAL DATA:  Altered mental status.  EXAM: CHEST  2 VIEW  COMPARISON:  08/08/2013.  FINDINGS: There is left upper lobe airspace disease. There is no pleural effusion  or pneumothorax. The heart size is normal. The osseous structures are unremarkable.  IMPRESSION: Left upper lobe pneumonia. Recommend followup radiography in 4-6 weeks, to document complete resolution following adequate medical therapy. If there is not complete resolution, then recommend further evaluation with CT of the chest to exclude underlying pathology.   Electronically Signed   By: Elige Ko   On: 10/11/2013 15:06   Ct Head Wo Contrast  10/11/2013   CLINICAL DATA:  Slurred speech. Altered mental status.  EXAM: CT HEAD WITHOUT CONTRAST  TECHNIQUE: Contiguous axial images were obtained from the base of the skull through the vertex without intravenous contrast.  COMPARISON:  CT 08/08/2013  FINDINGS: Acute hemorrhage in the left parietal white matter on the prior study has resolved. There is a 2 cm hypodensity in this area. No acute hemorrhage.  Moderate to advanced atrophy. Negative for hydrocephalus. Chronic microvascular ischemic change in the white matter. Negative for acute infarct or mass.  IMPRESSION: Left parietal white matter hypodensity consistent with resolving hematoma compared with the prior study.  Extensive atrophy which has progressed from the prior study. No acute infarct or hemorrhage.   Electronically Signed   By: Marlan Palau M.D.   On: 10/11/2013 16:40    CBC  Recent Labs Lab 10/11/13 1700 10/11/13 2204 10/12/13 0500 10/13/13 0317  WBC 16.4* 13.9* 13.1* 12.3*  HGB 10.8* 10.9* 11.4* 10.4*  HCT 33.1* 34.4* 35.4* 32.1*  PLT 175 153 156 142*  MCV 90.4 92.0 90.8 90.4  MCH 29.5 29.1 29.2 29.3  MCHC 32.6 31.7 32.2 32.4  RDW 15.9* 16.0* 15.9* 15.8*  LYMPHSABS 1.3  --   --   --   MONOABS 1.3*  --   --   --   EOSABS 0.1  --   --   --   BASOSABS 0.0  --   --   --     Chemistries   Recent Labs Lab 10/11/13 1700 10/11/13 2204 10/12/13 0500 10/13/13 0317 10/14/13 0450  NA 147*  --  153* 146* 132*  K 4.4  --  4.2 3.1* 3.8  CL 114*  --  118* 113* 91*  CO2 23  --   24 24 29   GLUCOSE 179*  --  100* 113* 236*  BUN 24*  --  20 15 19   CREATININE 0.75 0.83 0.80 0.81 3.26*  CALCIUM 7.9*  --  8.4 7.6* 9.3  MG  --   --   --  2.2 2.0  AST 27  --   --   --   --   ALT 14  --   --   --   --  ALKPHOS 52  --   --   --   --   BILITOT 0.6  --   --   --   --    ------------------------------------------------------------------------------------------------------------------ estimated creatinine clearance is 16.9 ml/min (by C-G formula based on Cr of 3.26). ------------------------------------------------------------------------------------------------------------------  Coagulation profile  Recent Labs Lab 10/11/13 1700  INR 1.39    Cardiac Enzymes  Recent Labs Lab 10/12/13 0500 10/12/13 0944 10/12/13 1521  TROPONINI <0.30 <0.30 <0.30   ------------------------------------------------------------------------------------------------------------------     Time Spent in minutes   45   Asa Lente, Tessa PA-S on 10/14/2013 at 9:13 AM  Between 7am to 7pm - Pager - [phone]  After 7pm go to [email or web address] - password [email or web address]  And look for the night coverage person covering for me after hours  [facility] Office  [phone]

## 2013-10-15 ENCOUNTER — Inpatient Hospital Stay (HOSPITAL_COMMUNITY): Payer: Medicare Other

## 2013-10-15 DIAGNOSIS — J189 Pneumonia, unspecified organism: Principal | ICD-10-CM

## 2013-10-15 DIAGNOSIS — I69922 Dysarthria following unspecified cerebrovascular disease: Secondary | ICD-10-CM

## 2013-10-15 LAB — CBC
Hemoglobin: 10.8 g/dL — ABNORMAL LOW (ref 13.0–17.0)
MCH: 29.2 pg (ref 26.0–34.0)
MCHC: 32.5 g/dL (ref 30.0–36.0)
Platelets: 144 10*3/uL — ABNORMAL LOW (ref 150–400)
RDW: 15.8 % — ABNORMAL HIGH (ref 11.5–15.5)

## 2013-10-15 LAB — GLUCOSE, CAPILLARY
Glucose-Capillary: 121 mg/dL — ABNORMAL HIGH (ref 70–99)
Glucose-Capillary: 87 mg/dL (ref 70–99)
Glucose-Capillary: 106 mg/dL — ABNORMAL HIGH (ref 70–99)
Glucose-Capillary: 120 mg/dL — ABNORMAL HIGH (ref 70–99)

## 2013-10-15 LAB — BASIC METABOLIC PANEL
BUN: 10 mg/dL (ref 6–23)
Calcium: 7.5 mg/dL — ABNORMAL LOW (ref 8.4–10.5)
Chloride: 108 mEq/L (ref 96–112)
Creatinine, Ser: 0.76 mg/dL (ref 0.50–1.35)
GFR calc Af Amer: >90 mL/min (ref >90)
GFR calc non Af Amer: 86 mL/min — ABNORMAL LOW (ref >90)
Glucose, Bld: 101 mg/dL — ABNORMAL HIGH (ref 70–99)

## 2013-10-15 LAB — PHOSPHORUS: Phosphorus: 2.7 mg/dL (ref 2.3–4.6)

## 2013-10-15 LAB — MAGNESIUM: Magnesium: 2.2 mg/dL (ref 1.5–2.5)

## 2013-10-15 LAB — PROCALCITONIN: Procalcitonin: 3.42 ng/mL

## 2013-10-15 MED ORDER — JEVITY 1.2 CAL PO LIQD
1000.0000 mL | ORAL | Status: DC
  Administered 2013-10-15: 14:00:00
  Filled 2013-10-15 (×3): qty 1000

## 2013-10-15 MED ORDER — METOPROLOL TARTRATE 25 MG PO TABS
25.0000 mg | ORAL_TABLET | Freq: Two times a day (BID) | ORAL | Status: DC
  Administered 2013-10-15 – 2013-10-21 (×13): 25 mg via ORAL
  Filled 2013-10-15 (×14): qty 1

## 2013-10-15 MED ORDER — PIPERACILLIN-TAZOBACTAM 3.375 G IVPB
3.3750 g | Freq: Three times a day (TID) | INTRAVENOUS | Status: DC
  Administered 2013-10-15 – 2013-10-20 (×15): 3.375 g via INTRAVENOUS
  Filled 2013-10-15 (×17): qty 50

## 2013-10-15 MED ORDER — FREE WATER
200.0000 mL | Freq: Four times a day (QID) | Status: DC
  Administered 2013-10-15 – 2013-10-21 (×17): 200 mL

## 2013-10-15 MED ORDER — HEPARIN SODIUM (PORCINE) 5000 UNIT/ML IJ SOLN
5000.0000 [IU] | Freq: Three times a day (TID) | INTRAMUSCULAR | Status: DC
  Administered 2013-10-15 – 2013-10-18 (×8): 5000 [IU] via SUBCUTANEOUS
  Filled 2013-10-15 (×12): qty 1

## 2013-10-15 MED ORDER — POTASSIUM CHLORIDE 20 MEQ/15ML (10%) PO LIQD
40.0000 meq | Freq: Once | ORAL | Status: AC
  Administered 2013-10-15: 40 meq via ORAL
  Filled 2013-10-15: qty 30

## 2013-10-15 MED ORDER — SODIUM CHLORIDE 0.9 % IV SOLN: INTRAVENOUS | Status: DC

## 2013-10-15 NOTE — Progress Notes (Signed)
Subjective:  Speaks little today. Monitor sinus rhythm but occasional slow atrial flutter with controlled ventricular response. Significant improvement in BUN/Cr today.  Objective:  Vital Signs in the last 24 hours: Temp:  [97.4 F (36.3 C)-99.6 F (37.6 C)] 98.7 F (37.1 C) (11/09 0800) Pulse Rate:  [52-162] 52 (11/09 0900) Cardiac Rhythm:  [-] Atrial fibrillation (11/09 0007) Resp:  [17-29] 27 (11/09 0900) BP: (112-150)/(41-75) 113/57 mmHg (11/09 0900) SpO2:  [91 %-100 %] 96 % (11/09 0900)  Physical Exam: BP Readings from Last 1 Encounters:  10/15/13 113/57     Wt Readings from Last 1 Encounters:  10/12/13 61.9 kg (136 lb 7.4 oz)    Weight change:   HEENT: [place]/AT, Eyes-Brown, PERL, EOMI, Conjunctiva-Pale pink, Sclera-Non-icteric Neck: No JVD, No bruit, Trachea midline. Lungs:  Clear, Bilateral. Cardiac:  Irregular rhythm, normal S1 and S2, no S3. II/VI systolic murmur Abdomen:  Soft, non-tender. Extremities:  No edema present. No cyanosis. No clubbing. CNS: AxOx3, Cranial nerves grossly intact, moves all 4 extremities. Right sided weakness. Skin: Warm and dry.   Intake/Output from previous day: 11/08 0701 - 11/09 0700 In: 1250 [I.V.:900; IV Piggyback:350] Out: 795 [Urine:795]    Lab Results: BMET    Component Value Date/Time   NA 143 10/15/2013 0436   K 3.2* 10/15/2013 0436   CL 108 10/15/2013 0436   CO2 20 10/15/2013 0436   GLUCOSE 101* 10/15/2013 0436   BUN 10 10/15/2013 0436   CREATININE 0.76 10/15/2013 0436   CALCIUM 7.5* 10/15/2013 0436   GFRNONAA 86* 10/15/2013 0436   GFRAA >90 10/15/2013 0436   CBC    Component Value Date/Time   WBC 19.8* 10/15/2013 0436   RBC 3.70* 10/15/2013 0436   RBC 2.70* 07/04/2010 1517   HGB 10.8* 10/15/2013 0436   HCT 33.2* 10/15/2013 0436   PLT 144* 10/15/2013 0436   MCV 89.7 10/15/2013 0436   MCH 29.2 10/15/2013 0436   MCHC 32.5 10/15/2013 0436   RDW 15.8* 10/15/2013 0436   LYMPHSABS 1.3 10/11/2013 1700   MONOABS 1.3* 10/11/2013 1700   EOSABS 0.1 10/11/2013 1700   BASOSABS 0.0 10/11/2013 1700   CARDIAC ENZYMES Lab Results  Component Value Date   CKTOTAL 77 07/04/2010   CKMB 1.4 07/04/2010   TROPONINI <0.30 10/12/2013    Scheduled Meds: . amiodarone  200 mg Per Tube BID  . antiseptic oral rinse  15 mL Mouth Rinse q12n4p  . aspirin  81 mg Per Tube Daily  . chlorhexidine  15 mL Mouth Rinse BID  . Chlorhexidine Gluconate Cloth  6 each Topical Q0600  . diltiazem  120 mg Per Tube Q8H  . free water  200 mL Per Tube Q6H  . guaifenesin  600 mg Per Tube BID  . heparin subcutaneous  5,000 Units Subcutaneous Q8H  . linezolid  600 mg Intravenous Q12H  . metoprolol tartrate  25 mg Oral BID  . mupirocin ointment  1 application Nasal BID  . pantoprazole sodium  40 mg Per Tube Daily  . piperacillin-tazobactam (ZOSYN)  IV  3.375 g Intravenous Q8H  . potassium chloride  40 mEq Oral Once  . sodium chloride  3 mL Intravenous Q12H  . Valproic Acid  750 mg Per Tube BID   Continuous Infusions: . sodium chloride 50 mL/hr at 10/15/13 0853  . feeding supplement (JEVITY 1.2 CAL)     PRN Meds:.acetaminophen, acetaminophen, alum & mag hydroxide-simeth, metoprolol, ondansetron (ZOFRAN) IV, ondansetron, oxyCODONE, sorbitol  Assessment/Plan: Paroxysmal recurrent A. fib  Hypertension  Coronary artery disease history of questionable septal MI in the past  Status post hemorrhagic left frontoparietal CVA with right hemiparesis  Left upper lobe pneumonia -wosening  Glucose intolerance  Seizure disorder  History of adenocarcinoma of colon  Failure to thrive   Add small dose B-blocker along with amiodarone and diltiazem to promote Sinus rhythm    LOS: 4 days    Francisco Cobb  MD  10/15/2013, 9:52 AM

## 2013-10-15 NOTE — Progress Notes (Signed)
eLink Physician-Brief Progress Note Patient Name: Francisco Johnston DOB: [birth year] MRN: [id number]  Date of Service  10/15/2013   HPI/Events of Note  2 large loose stools - nurse requesting flexiseal.  No documented skin breakdown.  Patient is difficult to move.  WBC has increased to 19K on no steroids, on ABX, no fever.   eICU Interventions  Plan: Hold on flexiseal for now Stool for C Diff PCR Flexiseal if patient continues to have increased frequent stools.   Intervention Category Minor Interventions: Clinical assessment - ordering diagnostic tests  Aundra Espin 10/15/2013, 11:49 PM

## 2013-10-15 NOTE — Progress Notes (Signed)
[facility]                                                                                Patient Demographics  Francisco Johnston, is a 79 y.o. male, DOB - [birth year], YNW:[id number]  Admit date - 10/11/2013   Admitting Physician Jeralyn Bennett, MD  Outpatient Primary MD for the patient is [name], MD  LOS - 4   Chief Complaint  Patient presents with  . Altered Mental Status       Summary   79 year old African American male who had a hemorrhagic CVA causing right-sided hemiparesis, dysphagia and aphasia few months ago, he was since living at a nursing home, came in on 10/11/2013 from nursing home for fevers, lethargy and workup was suggestive of HCAP, he was started on vancomycin and Zosyn upon admissions, cultures are negative to date, on the night of admission he developed atrial fibrillation with RVR, cardiology was consulted, he required digoxin-amiodarone-Cardizem-Lopressor to control his rate, he's finally converted to sinus, he was transiently hypotensive due to sustained tachycardia.  On day 3 he has developed acute renal failure, despite antibiotics his left-sided infiltrate is much worse and he is febrile again. I have discussed with her son and his prognosis is poor, however he wants aggressive care, pulmonary critical care has been consulted for further input. I have added azithromycin for atypical coverage although this is highly unlikely in the light of him being a nursing home resident for the last few months.    Assessment & Plan     Failure to thrive/functional decline from Met Encephalopathy HCAP  -Patient reportedly have a temperature 101 at his nursing home, developing chills, with lab work showing white count in the 16,000 range, and chest x-ray showing LUL  Infiltrate, since admission he is on IV vancomycin and Zosyn, date cultures are negative, followed by speech therapist, currently changed to Tube feeds per PCCM via NG on 10-14-13. PCCM recommeds G-J  tube as ? Recurrent aspiration, son informed.   However in the morning of 10/14/2013 he looks more lethargic and sick, repeated chest x-ray which shows worsening of left-sided infiltrate. Still running fevers, PCCM following and have adjusted his ABX, CXR worsened.      New onset A fib with RVR Cardiology following, on PO amiodarone + Cardizem, stable TSH and echogram with EF of around 60%, he is not a Candidate for anticoagulation due to recent hemorrhagic CVA, into the son clearly. Remains at risk for a new ischemic stroke.  Lab Results  Component Value Date   TSH 1.741 10/12/2013       Hypotension Due to combination of pneumonia along with A. fib with RVR. Blood pressure improved after IV fluids.      Acute renal failure  Likely due to ATN caused by hypotension during his episodes of atrial fibrillation with RVR, resolved post IVF.       History of seizures post recent hemorrhagic CVA with chronic right-sided hemiparesis and aphasia and dysphagia -valproic acid level within therapeutic range -continue his home dose of valproic acid at 750 mg by mouth twice a day. -Continue on dysphagia 1 diet with full feeding assistance and aspiration precautions, speech  therapy following.     Hypernatremia  Resolved after hydration with initially D5W and now with normal saline.      Hypokalemia  Replaced  oral       Code Status: full, D/W with patient's son. Explained poor prognosis and poor quality of life x 2 to son louis  Family Communication: none  Disposition Plan: inpatient    Procedures echo gram, renal ultrasound ordered, chest x-ray CT head   Consults cardiology, PCCM   Medications  Scheduled Meds: . amiodarone  200 mg Per Tube BID  . antiseptic oral rinse  15 mL Mouth Rinse q12n4p  . aspirin  81 mg Per Tube Daily  . chlorhexidine  15 mL Mouth Rinse BID  . Chlorhexidine Gluconate Cloth  6 each Topical Q0600  . diltiazem  120 mg Per Tube  Q8H  . free water  200 mL Per Tube Q6H  . guaifenesin  600 mg Per Tube BID  . linezolid  600 mg Intravenous Q12H  . mupirocin ointment  1 application Nasal BID  . pantoprazole sodium  40 mg Per Tube Daily  . piperacillin-tazobactam (ZOSYN)  IV  2.25 g Intravenous Q6H  . potassium chloride  40 mEq Oral Once  . sodium chloride  3 mL Intravenous Q12H  . Valproic Acid  750 mg Per Tube BID   Continuous Infusions: . sodium chloride    . feeding supplement (JEVITY 1.2 CAL)     PRN Meds:.acetaminophen, acetaminophen, alum & mag hydroxide-simeth, metoprolol, ondansetron (ZOFRAN) IV, ondansetron, oxyCODONE, sorbitol  DVT Prophylaxis  SCD  Lab Results  Component Value Date   PLT 144* 10/15/2013    Antibiotics  IV zosyn and IV vancomycin   Anti-infectives   Start     Dose/Rate Route Frequency Ordered Stop   10/16/13 0300  vancomycin (VANCOCIN) IVPB 750 mg/150 ml premix  Status:  Discontinued     750 mg 150 mL/hr over 60 Minutes Intravenous Every 48 hours 10/14/13 1022 10/14/13 1030   10/14/13 1200  piperacillin-tazobactam (ZOSYN) IVPB 2.25 g     2.25 g 100 mL/hr over 30 Minutes Intravenous 4 times per day 10/14/13 1022     10/14/13 1045  linezolid (ZYVOX) IVPB 600 mg     600 mg 300 mL/hr over 60 Minutes Intravenous Every 12 hours 10/14/13 1030     10/14/13 0915  azithromycin (ZITHROMAX) 500 mg in dextrose 5 % 250 mL IVPB  Status:  Discontinued     500 mg 250 mL/hr over 60 Minutes Intravenous Every 24 hours 10/14/13 0910 10/14/13 1030   10/14/13 0421  vancomycin (VANCOCIN) IVPB 750 mg/150 ml premix  Status:  Discontinued     750 mg 150 mL/hr over 60 Minutes Intravenous Every 8 hours 10/13/13 2031 10/14/13 1015   10/12/13 0600  vancomycin (VANCOCIN) IVPB 750 mg/150 ml premix  Status:  Discontinued     750 mg 150 mL/hr over 60 Minutes Intravenous Every 12 hours 10/11/13 2051 10/13/13 2031   10/12/13 0400  piperacillin-tazobactam (ZOSYN) IVPB 3.375 g  Status:  Discontinued     3.375  g 12.5 mL/hr over 240 Minutes Intravenous 3 times per day 10/11/13 2051 10/14/13 1022   10/11/13 1745  vancomycin (VANCOCIN) IVPB 1000 mg/200 mL premix     1,000 mg 200 mL/hr over 60 Minutes Intravenous  Once 10/11/13 1736 10/11/13 1902   10/11/13 1715  ciprofloxacin (CIPRO) IVPB 400 mg  Status:  Discontinued     400 mg 200 mL/hr over 60 Minutes Intravenous  Once 10/11/13 1700 10/11/13 2002   10/11/13 1715  vancomycin (VANCOCIN) 15 mg/kg in sodium chloride 0.9 % 100 mL IVPB  Status:  Discontinued     15 mg/kg 100 mL/hr over 60 Minutes Intravenous  Once 10/11/13 1700 10/11/13 1735   10/11/13 1715  piperacillin-tazobactam (ZOSYN) IVPB 3.375 g     3.375 g 100 mL/hr over 30 Minutes Intravenous  Once 10/11/13 1700 10/11/13 1803          Subjective:   Francisco Johnston -unable to obtain objective.   Objective:   Filed Vitals:   10/15/13 0007 10/15/13 0009 10/15/13 0359 10/15/13 0400  BP: 112/54 133/64  118/75  Pulse:      Temp: 99.6 F (37.6 C)  98.6 F (37 C) 98.6 F (37 C)  TempSrc: Oral  Oral Oral  Resp:      Height:      Weight:      SpO2:        Wt Readings from Last 3 Encounters:  10/12/13 61.9 kg (136 lb 7.4 oz)  08/07/13 69.1 kg (152 lb 5.4 oz)  04/05/13 72.122 kg (159 lb)     Intake/Output Summary (Last 24 hours) at 10/15/13 0841 Last data filed at 10/15/13 0400  Gross per 24 hour  Intake   1250 ml  Output    795 ml  Net    455 ml    Exam Aphasic, Awake, able to node head and follows with gaze, chronic right-sided hemiparesis Supple Neck,No JVD, No cervical lymphadenopathy appriciated.  Symmetrical Chest wall movement, Good air movement bilaterally , oats breath sounds left more than right Irregular rate and rhythm, No Gallops,Rubs or new Murmurs, No Parasternal Heave +B.Sounds, Abd Soft, Non tender, No organomegaly appriciated, No rebound - guarding or rigidity. No Cyanosis, Clubbing or edema, No new Rash or bruise unable to move right arm and right leg.  Muscle tone on the left side intact.    Data Review   Micro Results Recent Results (from the past 240 hour(s))  MRSA PCR SCREENING     Status: Abnormal   Collection Time    10/11/13  8:07 PM      Result Value Range Status   MRSA by PCR POSITIVE (*) NEGATIVE Final   Comment:            The GeneXpert MRSA Assay (FDA     approved for NASAL specimens     only), is one component of a     comprehensive MRSA colonization     surveillance program. It is not     intended to diagnose MRSA     infection nor to guide or     monitor treatment for     MRSA infections.     RESULT CALLED TO, READ BACK BY AND VERIFIED WITH:     ELANEY BY TCLEVELAND 10/12/13 AT 12:28AM  CULTURE, BLOOD (ROUTINE X 2)     Status: None   Collection Time    10/11/13  9:50 PM      Result Value Range Status   Specimen Description BLOOD LEFT HAND   Final   Special Requests BOTTLES DRAWN AEROBIC ONLY 5CC   Final   Culture  Setup Time     Final   Value: 10/12/2013 01:56     Performed at [facility]   Culture     Final   Value:        BLOOD CULTURE RECEIVED NO GROWTH TO DATE CULTURE WILL BE HELD  FOR 5 DAYS BEFORE ISSUING A FINAL NEGATIVE REPORT     Performed at [facility]   Report Status PENDING   Incomplete  CULTURE, BLOOD (ROUTINE X 2)     Status: None   Collection Time    10/11/13 10:05 PM      Result Value Range Status   Specimen Description BLOOD LEFT HAND   Final   Special Requests BOTTLES DRAWN AEROBIC ONLY 5CC   Final   Culture  Setup Time     Final   Value: 10/12/2013 01:57     Performed at [facility]   Culture     Final   Value:        BLOOD CULTURE RECEIVED NO GROWTH TO DATE CULTURE WILL BE HELD FOR 5 DAYS BEFORE ISSUING A FINAL NEGATIVE REPORT     Performed at [facility]   Report Status PENDING   Incomplete    Radiology Reports Dg Chest 2 View  10/11/2013   CLINICAL DATA:  Altered mental status.  EXAM: CHEST  2 VIEW  COMPARISON:  08/08/2013.  FINDINGS: There  is left upper lobe airspace disease. There is no pleural effusion or pneumothorax. The heart size is normal. The osseous structures are unremarkable.  IMPRESSION: Left upper lobe pneumonia. Recommend followup radiography in 4-6 weeks, to document complete resolution following adequate medical therapy. If there is not complete resolution, then recommend further evaluation with CT of the chest to exclude underlying pathology.   Electronically Signed   By: Elige Ko   On: 10/11/2013 15:06   Ct Head Wo Contrast  10/11/2013   CLINICAL DATA:  Slurred speech. Altered mental status.  EXAM: CT HEAD WITHOUT CONTRAST  TECHNIQUE: Contiguous axial images were obtained from the base of the skull through the vertex without intravenous contrast.  COMPARISON:  CT 08/08/2013  FINDINGS: Acute hemorrhage in the left parietal white matter on the prior study has resolved. There is a 2 cm hypodensity in this area. No acute hemorrhage.  Moderate to advanced atrophy. Negative for hydrocephalus. Chronic microvascular ischemic change in the white matter. Negative for acute infarct or mass.  IMPRESSION: Left parietal white matter hypodensity consistent with resolving hematoma compared with the prior study.  Extensive atrophy which has progressed from the prior study. No acute infarct or hemorrhage.   Electronically Signed   By: Marlan Palau M.D.   On: 10/11/2013 16:40    CBC  Recent Labs Lab 10/11/13 1700 10/11/13 2204 10/12/13 0500 10/13/13 0317 10/15/13 0436  WBC 16.4* 13.9* 13.1* 12.3* 19.8*  HGB 10.8* 10.9* 11.4* 10.4* 10.8*  HCT 33.1* 34.4* 35.4* 32.1* 33.2*  PLT 175 153 156 142* 144*  MCV 90.4 92.0 90.8 90.4 89.7  MCH 29.5 29.1 29.2 29.3 29.2  MCHC 32.6 31.7 32.2 32.4 32.5  RDW 15.9* 16.0* 15.9* 15.8* 15.8*  LYMPHSABS 1.3  --   --   --   --   MONOABS 1.3*  --   --   --   --   EOSABS 0.1  --   --   --   --   BASOSABS 0.0  --   --   --   --     Chemistries   Recent Labs Lab 10/11/13 1700 10/11/13 2204  10/12/13 0500 10/13/13 0317 10/14/13 0450 10/15/13 0436  NA 147*  --  153* 146* 132* 143  K 4.4  --  4.2 3.1* 3.8 3.2*  CL 114*  --  118* 113* 91* 108  CO2 23  --  24 24 29 20   GLUCOSE 179*  --  100* 113* 236* 101*  BUN 24*  --  20 15 19 10   CREATININE 0.75 0.83 0.80 0.81 3.26* 0.76  CALCIUM 7.9*  --  8.4 7.6* 9.3 7.5*  MG  --   --   --  2.2 2.0 2.2  AST 27  --   --   --   --   --   ALT 14  --   --   --   --   --   ALKPHOS 52  --   --   --   --   --   BILITOT 0.6  --   --   --   --   --    ------------------------------------------------------------------------------------------------------------------ estimated creatinine clearance is 68.8 ml/min (by C-G formula based on Cr of 0.76). ------------------------------------------------------------------------------------------------------------------  Coagulation profile  Recent Labs Lab 10/11/13 1700  INR 1.39    Cardiac Enzymes  Recent Labs Lab 10/12/13 0500 10/12/13 0944 10/12/13 1521  TROPONINI <0.30 <0.30 <0.30   ------------------------------------------------------------------------------------------------------------------     Time Spent in minutes   45   Conte, Tessa PA-S on 10/15/2013 at 8:41 AM  Between 7am to 7pm - Pager - [phone]  After 7pm go to [email or web address] - password [email or web address]  And look for the night coverage person covering for me after hours  [facility] Office  [phone]

## 2013-10-15 NOTE — Progress Notes (Signed)
Patient ID: Francisco Johnston, male   DOB: 01/14/1934, 79 y.o.   MRN: [id number]   Request for percutaneous gastric tube received  CVA; FTT Hx colon Ca LUL PNA worsening  Tmax 100.6 11/8 Wbc from 12.3 to 19.8 11/9  Will monitor labs and temp Need afeb x 48hrs and wbc trending down to safely perform percutaneous procedure  Pt on IR radar

## 2013-10-15 NOTE — Progress Notes (Signed)
Foley inserted per MD Thedore Mins orders; pt with dramatic increase in creatnine overnight; bladder scan completed per MD verbal order with urine noted; foley huddle completed by RN x 3 and peri/foley care completed prior to insertion; foley inserted by Thayer Ohm RN with Marylene Land RN at bedside

## 2013-10-15 NOTE — Progress Notes (Addendum)
PULMONARY  / CRITICAL CARE MEDICINE  Name: Francisco Johnston MRN: [id number] DOB: 01/25/1934    ADMISSION DATE:  10/11/2013 CONSULTATION DATE:  11/8  REFERRING MD :  Thedore Mins PRIMARY SERVICE: [facility]  CHIEF COMPLAINT:  pneumonia  BRIEF PATIENT DESCRIPTION:  This is a 79 year old male who resides at an SNF requiring assist w/ ADLs s/p hemorrhagic stroke w/ residual right sided hemiparesis, dysphagia and aphagia (august 2014). Admitted on 11/5 w/ HCAP vs aspiration, treated w/ abx. Course c/b AF w/ RVR, and progressive renal failure. PCCM asked to see on 11/8 for worsening left sided PNA.   SIGNIFICANT EVENTS / STUDIES:  11/8 RUS: neg for hydro 11/6 ECHO: NML EF, 55-65%, normal wall motion   LINES / TUBES: PIV  CULTURES: BCX2 11/5>>>NEG MRSA screen 11/5: positive    ANTIBIOTICS: vanc 11/5>>>11/8 zyvox 11/8>>> Zosyn 11/5>>> Azith X 1 11/8>>11/8   SUBJECTIVE:  No distress   VITAL SIGNS: Temp:  [97.4 F (36.3 C)-99.6 F (37.6 C)] 98.7 F (37.1 C) (11/09 0800) Pulse Rate:  [52-162] 52 (11/09 0900) Resp:  [17-29] 27 (11/09 0900) BP: (112-150)/(41-75) 113/57 mmHg (11/09 0900) SpO2:  [91 %-100 %] 96 % (11/09 0900) 2 liters-->4 liters        INTAKE / OUTPUT: Intake/Output     11/08 0701 - 11/09 0700 11/09 0701 - 11/10 0700   I.V. (mL/kg) 1000 (16.2) 105.8 (1.7)   NG/GT  200   IV Piggyback 350 25   Total Intake(mL/kg) 1350 (21.8) 330.8 (5.3)   Urine (mL/kg/hr) 795 (0.5) 38 (0.2)   Stool     Total Output 795 38   Net +555 +292.8          PHYSICAL EXAMINATION: General:  Chronically ill appearing AAM, not in acute distress.  Neuro:  Awake, follows commands, aphasic, right sided hemiplegia.  HEENT:  Tongue deviates to right no JVD, edentulous  Cardiovascular:  rrr Lungs:  Decreased on left  Abdomen: soft, + bowel sounds Musculoskeletal:  Intact  Skin:  Intact   LABS:  CBC Recent Labs     10/13/13  0317  10/15/13  0436  WBC  12.3*  19.8*  HGB  10.4*  10.8*   HCT  32.1*  33.2*  PLT  142*  144*   Coag's No results found for this basename: APTT, INR,  in the last 72 hours BMET Recent Labs     10/13/13  0317  10/14/13  0450  10/15/13  0436  NA  146*  132*  143  K  3.1*  3.8  3.2*  CL  113*  91*  108  CO2  24  29  20   BUN  15  19  10   CREATININE  0.81  3.26*  0.76  GLUCOSE  113*  236*  101*   Electrolytes Recent Labs     10/13/13  0317  10/14/13  0450  10/15/13  0436  CALCIUM  7.6*  9.3  7.5*  MG  2.2  2.0  2.2  PHOS   --    --   2.7   Sepsis Markers Recent Labs     10/14/13  0450  10/15/13  0436  PROCALCITON  0.42  3.42   ABG No results found for this basename: PHART, PCO2ART, PO2ART,  in the last 72 hours Liver Enzymes No results found for this basename: AST, ALT, ALKPHOS, BILITOT, ALBUMIN,  in the last 72 hours Cardiac Enzymes Recent Labs     10/12/13  1521  TROPONINI  <0.30   Glucose Recent Labs     10/14/13  1308  10/14/13  1625  10/14/13  2100  10/15/13  0032  10/15/13  0417  10/15/13  0746  GLUCAP  130*  140*  123*  139*  121*  109*   Erythrocyte Sedimentation Rate     Component Value Date/Time   ESRSEDRATE 108* 10/14/2013 1319     Imaging US Renal  10/14/2013   CLINICAL DATA:  Acute renal failure  EXAM: RENAL/URINARY TRACT ULTRASOUND COMPLETE  COMPARISON:  None.  FINDINGS: Right Kidney  Length: 9.9. Echogenicity within normal limits. No mass or hydronephrosis visualized.  Left Kidney  Length: 10.5. Echogenicity within normal limits. No mass or hydronephrosis visualized.  Bladder  Decompressed by indwelling Foley catheter.  Additional comments: Left pleural effusion.  IMPRESSION: No hydronephrosis.  Bladder decompressed by indwelling Foley catheter.   Electronically Signed   By: Charline Bills M.D.   On: 10/14/2013 09:51   Dg Chest Port 1 View  10/15/2013   CLINICAL DATA:  Check tube placement  EXAM: PORTABLE CHEST - 1 VIEW  COMPARISON:  10/14/2013, 10/11/2013.  FINDINGS: There is no endotracheal  to present within the field of view. There is a nasogastric tube present with the tip projecting over the stomach. There is gaseous distention of the stomach.  There is mild right basilar airspace disease unchanged in the prior exam. There is diffuse left lung interstitial and alveolar airspace opacities involving the upper and lower lungs. The heart and mediastinum are stable. The osseous structures demonstrate no focal abnormality.  IMPRESSION: Diffuse left lung interstitial and alveolar airspace opacities concerning for multi lobar pneumonia. There is right lower lobe airspace disease which may reflect atelectasis versus pneumonia.  Nasogastric tube with the tip projecting over the stomach.  No endotracheal tube is present within the field of view.   Electronically Signed   By: Elige Ko   On: 10/15/2013 08:27   Dg Chest Port 1 View  10/14/2013   CLINICAL DATA:  Shortness of breath.  EXAM: PORTABLE CHEST - 1 VIEW  COMPARISON:  10/11/2013  FINDINGS: Significant worsening of left lung pneumonia is noted by chest x-ray now involving essentially the entire left lung. No overt edema or significant associated pleural fluid is identified. The heart size and mediastinal contours are stable.  IMPRESSION: Significant worsening of left lung pneumonia.   Electronically Signed   By: Irish Lack M.D.   On: 10/14/2013 08:14   Dg Abd Portable 1v  10/14/2013   CLINICAL DATA:  Ileus  EXAM: PORTABLE ABDOMEN - 1 VIEW  COMPARISON:  Prior film same day  FINDINGS: No definite free abdominal air is noted. NG tube in place. Again noted mild gaseous distension of transverse colon.  IMPRESSION: No definite free abdominal air. Again noted mild gaseous distension transverse colon.   Electronically Signed   By: Natasha Mead M.D.   On: 10/14/2013 17:51   Dg Abd Portable 1v  10/14/2013   CLINICAL DATA:  NG tube check.  EXAM: PORTABLE ABDOMEN - 1 VIEW  COMPARISON:  Chest x-ray 10/11/2013 and 08/08/2013  FINDINGS: There has been  placement of an enteric tube with tip over the stomach in the left upper quadrant. There is a mildly dilated air-filled transverse colon. There is suggestion of airspace opacification over the left mid to upper lung. There are degenerative changes of the spine.  IMPRESSION: Minimally dilated air-filled transverse colon as cannot exclude distal colonic obstruction or ileus. Recommend  followup abdominal films as clinically indicated.  Airspace opacification over the left mid to upper lung. Recommend correlation with chest radiograph.  Enteric tube with tip over the stomach in the left upper quadrant.   Electronically Signed   By: Elberta Fortis M.D.   On: 10/14/2013 13:30     CXR:  No improvement left sided airspace disease, stable rll atx    ASSESSMENT / PLAN:  PULMONARY A: HCAP vs aspiration  Hypoxia  >in setting of progressive bilateral airspace disease.  >favor aspiration and possibly mild evolving ALI  -less likely amiodarone related but will keep on diff dx  P:   Needs to be NPO Consider PEG if long term support needed Wean FIO2 See ID section  Might consider stopping amiodarone   CARDIOVASCULAR A:  AF-->now in NS CAD P:  Cont current meds Cont tele  Cards is seeing, has added b-blocker, also on ccb. If can maintain NSR might be helpful to stop amiodarone given CXR changes.   RENAL A:   Acute renal failure  Resolved. Possibly r/t hypotension  Changed vanc to zyvox (in setting of renal failure) Hypokalemia  P: Per primary   GASTROINTESTINAL A:   Dysphagia  P:   NPO Place FT  HEMATOLOGIC A:   Anemia of chronic disease.   P:  [place] heparin  Trend CBC  INFECTIOUS A:   Aspiration vs HCAP Progression of Left > right airspace disease.  P:   HCAP coverage Change vanc to zyvox.  Trend PCT   ENDOCRINE A:   DM, hyperglycemia  P:   ssi  NEUROLOGIC A:  Prior hemorrhagic CVA      Deconditioning       Right sided hemiplegia      Dysphagia and aphasia  P:    Supportive care NPO Placed FT   TODAY'S SUMMARY:  Chronic aspiration, possibly mild ALI. Worsening renal fxn... May be vanc related. Will change vanc to zyvox. No need for azith here. Make him NPO, place FT. He will likely need PEG.   I have personally obtained a history, examined the patient, evaluated laboratory and imaging results, formulated the assessment and plan and placed orders.   Dorcas Carrow Beeper  [phone]  Cell  [phone]  If no response or cell goes to voicemail, call beeper [phone]  10/15/2013, 10:48 AM

## 2013-10-16 ENCOUNTER — Ambulatory Visit (HOSPITAL_COMMUNITY): Payer: Medicare Other

## 2013-10-16 DIAGNOSIS — I6350 Cerebral infarction due to unspecified occlusion or stenosis of unspecified cerebral artery: Secondary | ICD-10-CM

## 2013-10-16 LAB — CBC
HCT: 31.6 % — ABNORMAL LOW (ref 39.0–52.0)
Hemoglobin: 10.5 g/dL — ABNORMAL LOW (ref 13.0–17.0)
MCHC: 33.2 g/dL (ref 30.0–36.0)
MCV: 87.5 fL (ref 78.0–100.0)
RBC: 3.61 MIL/uL — ABNORMAL LOW (ref 4.22–5.81)
WBC: 17 10*3/uL — ABNORMAL HIGH (ref 4.0–10.5)

## 2013-10-16 LAB — BASIC METABOLIC PANEL
CO2: 18 mEq/L — ABNORMAL LOW (ref 19–32)
Chloride: 109 mEq/L (ref 96–112)
Potassium: 5 mEq/L (ref 3.5–5.1)
Sodium: 138 mEq/L (ref 135–145)

## 2013-10-16 LAB — PROCALCITONIN: Procalcitonin: 3.64 ng/mL

## 2013-10-16 LAB — CLOSTRIDIUM DIFFICILE BY PCR: Toxigenic C. Difficile by PCR: NEGATIVE

## 2013-10-16 LAB — GLUCOSE, CAPILLARY
Glucose-Capillary: 128 mg/dL — ABNORMAL HIGH (ref 70–99)
Glucose-Capillary: 127 mg/dL — ABNORMAL HIGH (ref 70–99)
Glucose-Capillary: 136 mg/dL — ABNORMAL HIGH (ref 70–99)

## 2013-10-16 LAB — PHOSPHORUS: Phosphorus: 2.7 mg/dL (ref 2.3–4.6)

## 2013-10-16 LAB — MAGNESIUM: Magnesium: 2.2 mg/dL (ref 1.5–2.5)

## 2013-10-16 MED ORDER — JEVITY 1.2 CAL PO LIQD
1000.0000 mL | ORAL | Status: DC
  Administered 2013-10-16: 14:00:00
  Administered 2013-10-17 – 2013-10-18 (×2): 1000 mL
  Filled 2013-10-16 (×9): qty 1000

## 2013-10-16 MED ORDER — PRO-STAT SUGAR FREE PO LIQD
30.0000 mL | Freq: Two times a day (BID) | ORAL | Status: DC
  Administered 2013-10-17 – 2013-10-23 (×12): 30 mL
  Filled 2013-10-16 (×15): qty 30

## 2013-10-16 NOTE — Clinical Social Work Note (Signed)
CSW contacted IT, who was able to help get [email or web address] connected. CSW submitted clinicals for insurance auth so pt can go to SNF when medically appropriate.    Maryclare Labrador, MSW, [facility] Clinical Social Worker [phone]

## 2013-10-16 NOTE — Progress Notes (Signed)
NUTRITION FOLLOW UP  Intervention:   1.  Enteral nutrition; advance Jevity 1.2 by 10 mL q 6 hrs to 60 mL/hr with Prostat BID provide 1928 kcal, 110g protein, and 1167 mL free water.  2. Monitor magnesium, potassium, and phosphorus daily until WNL.  Pharmacy to replete phos today.  Note hyperkalemia.  Nutrition Dx:   Malnutrition related to inadequate oral intake as evidenced by severe depletion of muscle mass and subcutaneous fat mass with 11% weight loss within the past month; ongoing.   Goal:  Intake to meet >90% of estimated nutrition needs, not met.   Monitor:  TF initiation/tolerance, labs, weight trend.  Assessment:   Patient with a past medical history of left frontal hemorrhagic CVA, discharged from the stroke service on 08/06/2013 to skilled nursing facility; was transferred to the ED from his SNF on 11/5 with complaints of overall functional decline/failure to thrive. Severe malnutrition identified by RD on admission.   Per MD likely aspiration PNA, pt needs to be NPO. SLP has signed off as pt/son are pursuing PEG placement. Per MD note, prognosis is poor.   Pt currently on Jevity 1.2 @ 45 mL/hr which provides 1296 kcal, 59g protein, 885 mL free water.   Discussed with RN who reports pt not planning for PEG today.  Ongoing discussions with son.  Discussed new goal rate.   Height: Ht Readings from Last 1 Encounters:  10/12/13 6\' 2"  (1.88 m)    Weight Status:   Wt Readings from Last 1 Encounters:  10/16/13 153 lb 10.6 oz (69.7 kg)  Admission weight 129 lb (58.9 kg)  Re-estimated needs:  Kcal: 1900-2100  Protein: 100-120 gm  Fluid: 1.9-2.1 L  Skin: no issues noted  Diet Order:  NPO   Intake/Output Summary (Last 24 hours) at 10/16/13 1153 Last data filed at 10/16/13 1111  Gross per 24 hour  Intake   3426 ml  Output    955 ml  Net   2471 ml    Last BM: 11/10, loose stools   Labs:   Recent Labs Lab 10/14/13 0450 10/15/13 0436 10/16/13 0425  NA 132*  143 138  K 3.8 3.2* 5.0  CL 91* 108 109  CO2 29 20 18*  BUN 19 10 11   CREATININE 3.26* 0.76 0.63  CALCIUM 9.3 7.5* 7.0*  MG 2.0 2.2 2.2  PHOS  --  2.7 2.7  GLUCOSE 236* 101* 126*    CBG (last 3)   Recent Labs  10/16/13 0032 10/16/13 0452 10/16/13 0745  GLUCAP 160* 119* 128*    Scheduled Meds: . amiodarone  200 mg Per Tube BID  . antiseptic oral rinse  15 mL Mouth Rinse q12n4p  . aspirin  81 mg Per Tube Daily  . chlorhexidine  15 mL Mouth Rinse BID  . diltiazem  120 mg Per Tube Q8H  . free water  200 mL Per Tube Q6H  . guaifenesin  600 mg Per Tube BID  . heparin subcutaneous  5,000 Units Subcutaneous Q8H  . linezolid  600 mg Intravenous Q12H  . metoprolol tartrate  25 mg Oral BID  . mupirocin ointment  1 application Nasal BID  . pantoprazole sodium  40 mg Per Tube Daily  . piperacillin-tazobactam (ZOSYN)  IV  3.375 g Intravenous Q8H  . sodium chloride  3 mL Intravenous Q12H  . Valproic Acid  750 mg Per Tube BID    Continuous Infusions: . feeding supplement (JEVITY 1.2 CAL) 35 mL/hr at 10/15/13 1800   [place]  Ashley RoyaltyMatthews, MS RD LDN Clinical Inpatient Dietitian Pager: [phone] Weekend/After hours pager: [phone]

## 2013-10-16 NOTE — Clinical Documentation Improvement (Signed)
Possible Clinical Conditions? - Severe Protein Calorie Malnutrition - Other Condition  Supporting Information: Per evaluation 11/6 by Registered Dietician:"meets criteria for Severe MALNUTRITION in the context of chronic illness as evidenced by severe depletion of muscle mass and subcutaneous fat mass with 11% weight loss within the past month. Subcutaneous Fat:  Orbital Region: severe depletion  Upper Arm Region: severe depletion  Thoracic and Lumbar Region: severe depletion Muscle:  Temple Region: severe depletion  Clavicle Bone Region: severe depletion  Clavicle and Acromion Bone Region: severe depletion  Scapular Bone Region: severe depletion  Dorsal Hand: severe depletion  Patellar Region: severe depletion  Anterior Thigh Region: severe depletion  Posterior Calf Region: severe depletion   Thank You, Tacey Ruiz Clinical Documentation Specialist:  [phone]  [facility]- Health Information Management

## 2013-10-16 NOTE — Progress Notes (Signed)
Subjective:  Patient awake in no distress.  Remains in sinusrhythm  Objective:  Vital Signs in the last 24 hours: Temp:  [97.7 F (36.5 C)-99.7 F (37.6 C)] 98.8 F (37.1 C) (11/10 1142) Pulse Rate:  [68-88] 79 (11/10 1142) Resp:  [16-32] 17 (11/10 1142) BP: (107-144)/(47-115) 136/57 mmHg (11/10 1142) SpO2:  [90 %-97 %] 95 % (11/10 1142) Weight:  [69.7 kg (153 lb 10.6 oz)] 69.7 kg (153 lb 10.6 oz) (11/10 0457)  Intake/Output from previous day: 11/09 0701 - 11/10 0700 In: 3896.8 [I.V.:1011.8; NG/GT:1797.5; IV Piggyback:1087.5] Out: 783 [Urine:783] Intake/Output from this shift: Total I/O In: 96 [I.V.:6; NG/GT:90] Out: 310 [Urine:310]  Physical Exam: Neck: no adenopathy, no carotid bruit, no JVD and supple, symmetrical, trachea midline Lungs: bilateral decreased breath sounds with rhonchi Heart: regular rate and rhythm, S1, S2 normal and soft systolic murmur noted Abdomen: soft, non-tender; bowel sounds normal; no masses,  no organomegaly Extremities: extremities normal, atraumatic, no cyanosis or edema  Lab Results:  Recent Labs  10/15/13 0436 10/16/13 0815  WBC 19.8* 17.0*  HGB 10.8* 10.5*  PLT 144* 140*    Recent Labs  10/15/13 0436 10/16/13 0425  NA 143 138  K 3.2* 5.0  CL 108 109  CO2 20 18*  GLUCOSE 101* 126*  BUN 10 11  CREATININE 0.76 0.63   No results found for this basename: TROPONINI, CK, MB,  in the last 72 hours Hepatic Function Panel No results found for this basename: PROT, ALBUMIN, AST, ALT, ALKPHOS, BILITOT, BILIDIR, IBILI,  in the last 72 hours No results found for this basename: CHOL,  in the last 72 hours No results found for this basename: PROTIME,  in the last 72 hours  Imaging: Imaging results have been reviewed and Dg Chest Port 1 View  10/15/2013   CLINICAL DATA:  Check tube placement  EXAM: PORTABLE CHEST - 1 VIEW  COMPARISON:  10/14/2013, 10/11/2013.  FINDINGS: There is no endotracheal to present within the field of view. There is  a nasogastric tube present with the tip projecting over the stomach. There is gaseous distention of the stomach.  There is mild right basilar airspace disease unchanged in the prior exam. There is diffuse left lung interstitial and alveolar airspace opacities involving the upper and lower lungs. The heart and mediastinum are stable. The osseous structures demonstrate no focal abnormality.  IMPRESSION: Diffuse left lung interstitial and alveolar airspace opacities concerning for multi lobar pneumonia. There is right lower lobe airspace disease which may reflect atelectasis versus pneumonia.  Nasogastric tube with the tip projecting over the stomach.  No endotracheal tube is present within the field of view.   Electronically Signed   By: Elige Ko   On: 10/15/2013 08:27   Dg Abd Portable 1v  10/14/2013   CLINICAL DATA:  Ileus  EXAM: PORTABLE ABDOMEN - 1 VIEW  COMPARISON:  Prior film same day  FINDINGS: No definite free abdominal air is noted. NG tube in place. Again noted mild gaseous distension of transverse colon.  IMPRESSION: No definite free abdominal air. Again noted mild gaseous distension transverse colon.   Electronically Signed   By: Natasha Mead M.D.   On: 10/14/2013 17:51   Dg Abd Portable 1v  10/14/2013   CLINICAL DATA:  NG tube check.  EXAM: PORTABLE ABDOMEN - 1 VIEW  COMPARISON:  Chest x-ray 10/11/2013 and 08/08/2013  FINDINGS: There has been placement of an enteric tube with tip over the stomach in the left upper quadrant. There  is a mildly dilated air-filled transverse colon. There is suggestion of airspace opacification over the left mid to upper lung. There are degenerative changes of the spine.  IMPRESSION: Minimally dilated air-filled transverse colon as cannot exclude distal colonic obstruction or ileus. Recommend followup abdominal films as clinically indicated.  Airspace opacification over the left mid to upper lung. Recommend correlation with chest radiograph.  Enteric tube with tip over  the stomach in the left upper quadrant.   Electronically Signed   By: Elberta Fortis M.D.   On: 10/14/2013 13:30    Cardiac Studies:  Assessment/Plan:  Status postParoxysmal recurrent A. fib with RVR  Hypertension  Coronary artery disease history of questionable septal MI in the past  Status post hemorrhagic left frontoparietal CVA with right paresis  Bilateral pneumonia Marked leukocytosis Diarrhea rule out antibiotic associated colitis Dysphagia  Glucose intolerance  Seizure disorder  History of adenocarcinoma of colon  Failure to thrive Plan Continue present management per primary team I will follow p.r.n.  LOS: 5 days    Darragh Nay N 10/16/2013, 11:46 AM

## 2013-10-16 NOTE — Clinical Social Work Note (Addendum)
[email or web address], the website used to submit clinicals for insurance authorization, is down. CSW called [employer] and got the fax number and has faxed clinicals for insurance auth so pt can return to [facility]  Addendum: Fax number provided by [employer] was busy. CSW attempted to fax clinicals a second and third time, but still received busy signal. CSW called [employer] for another fax number, and was given two other phone numbers. Both of these numbers also produced a busy signal. [employer] rep told CSW she can provide clinical information over the phone for insurance auth by speaking with one of their nurses. CSW will call [employer] back to be forwarded to the nurse after speaking with RNCM, who might be able to provide this information more efficiently and effectively than CSW.  Addendum: CSW called [employer] and spoke with representative, who took CSW's phone number and will forward CSW contact info to nurse so insurance auth might be acquired over the telephone. CSW continues to check [email or web address], which is still down, to send clinicals for insurance auth. CSW will also continue to fax clinicals to the 3 fax numbers provided for [employer] for auth.  Addendum: Fax numbers still busy and clinicals unable to go through. [email or web address] is also still down.  Addendum: CSW called IT and left a message asking them to return her call, as CSW is getting an error message reading:  "This Page Cannot Be Displayed. The system cannot communicate with the external server ( [email or web address] ). The Internet server may be busy, may be permanently down, or may be unreachable because of network problems. Please check the spelling of the Internet address entered. If it is correct, try this request later. Your Information and Technology Services team needs feedback from you to appropriately tune this web filtering service in order to protect [place] resources while at the same time allowing access for legitimate business or clinical  care. If you have questions, please contact IT Service Desk at [phone] and provide the codes shown below."  CSW awaiting IT to return her call in the hopes that they can fix the connection with [email or web address] so CSW can get insurance authorization.  Maryclare Labrador, MSW, [facility] Clinical Social Worker [phone]

## 2013-10-16 NOTE — Progress Notes (Signed)
SLP Cancellation Note  Patient Details Name: Francisco Johnston MRN: [id number] DOB: 01/04/1934   Cancelled treatment and D/C note:  Pt currently NPO with TF and pending PEG placement.  No SLP needs identified given decision by son to pursue PEG.  SLP will sign off.   [name] 10/16/2013, 10:49 AM

## 2013-10-16 NOTE — Progress Notes (Signed)
PULMONARY  / CRITICAL CARE MEDICINE  Name: BARNES FLOREK MRN: [id number] DOB: 1934-03-16    ADMISSION DATE:  10/11/2013 CONSULTATION DATE:  10/14/2013  REFERRING MD :  [facility] PRIMARY SERVICE: TRH  CHIEF COMPLAINT:  Pneumonia  BRIEF PATIENT DESCRIPTION:  79 yo nursing home resident with CVA and dysphagia admitted 11/5 with aspiration pneumonia.  SIGNIFICANT EVENTS / STUDIES:  11/8  Renal US >>> no ydronephrosis 11/6  TTE >>> EF 55-65%  LINES / TUBES:  CULTURES: 11/5 Blood >>> neg  ANTIBIOTICS: Vancomycin 11/5 >>>11/8 Zyvox 11/8 >>> Zosyn 11/5 >>> Azithromycin 11/8 x 1  SUBJECTIVE: No overnight issues.  VITAL SIGNS: Temp:  [97.7 F (36.5 C)-99.7 F (37.6 C)] 99 F (37.2 C) (11/10 0745) Pulse Rate:  [68-88] 86 (11/10 1111) Resp:  [16-32] 28 (11/10 0800) BP: (107-144)/(47-115) 129/52 mmHg (11/10 1111) SpO2:  [90 %-97 %] 92 % (11/10 0800) Weight:  [69.7 kg (153 lb 10.6 oz)] 69.7 kg (153 lb 10.6 oz) (11/10 0457)  INTAKE / OUTPUT: Intake/Output     11/09 0701 - 11/10 0700 11/10 0701 - 11/11 0700   I.V. (mL/kg) 1011.8 (14.5) 6 (0.1)   NG/GT 1797.5 90   IV Piggyback 1087.5    Total Intake(mL/kg) 3896.8 (55.9) 96 (1.4)   Urine (mL/kg/hr) 783 (0.5) 310 (1)   Total Output 783 310   Net +3113.8 -214        Urine Occurrence 1 x    Stool Occurrence 3 x      PHYSICAL EXAMINATION: General:  Ill appearing, no distress Neuro:  Sleepy, but arouses to stimulation, nonverbal, follows commands HEENT:  Dry membranes Cardiovascular:  Regular, no murmurs Lungs:  Bilateral air entry, no added breath sounds Abdomen: soft, nontender Musculoskeletal:  Intact  Skin:  Intact   LABS:  CBC  Recent Labs Lab 10/13/13 0317 10/15/13 0436 10/16/13 0815  WBC 12.3* 19.8* 17.0*  HGB 10.4* 10.8* 10.5*  HCT 32.1* 33.2* 31.6*  PLT 142* 144* 140*   Coag's  Recent Labs Lab 10/11/13 1700  APTT 32  INR 1.39   BMET  Recent Labs Lab 10/14/13 0450 10/15/13 0436 10/16/13 0425   NA 132* 143 138  K 3.8 3.2* 5.0  CL 91* 108 109  CO2 29 20 18*  BUN 19 10 11   CREATININE 3.26* 0.76 0.63  GLUCOSE 236* 101* 126*   Electrolytes  Recent Labs Lab 10/14/13 0450 10/15/13 0436 10/16/13 0425  CALCIUM 9.3 7.5* 7.0*  MG 2.0 2.2 2.2  PHOS  --  2.7 2.7   Sepsis Markers  Recent Labs Lab 10/14/13 0450 10/15/13 0436 10/16/13 0815  PROCALCITON 0.42 3.42 3.64   ABG No results found for this basename: PHART, PCO2ART, PO2ART,  in the last 168 hours  Liver Enzymes  Recent Labs Lab 10/11/13 1700  AST 27  ALT 14  ALKPHOS 52  BILITOT 0.6  ALBUMIN 1.6*   Cardiac Enzymes  Recent Labs Lab 10/12/13 0500 10/12/13 0944 10/12/13 1521  TROPONINI <0.30 <0.30 <0.30   Glucose  Recent Labs Lab 10/15/13 1141 10/15/13 1758 10/15/13 2101 10/16/13 0032 10/16/13 0452 10/16/13 0745  GLUCAP 87 106* 120* 160* 119* 128*   CXR:   ASSESSMENT / PLAN:  PULMONARY A: Aspiration pneumonia / HCAP. Asymmetric infiltrates - doubt Amiodarone toxicity. P:   Supplemental oxygen PRN Goal SpO2>92 Trend CXR Abx as above  CARDIOVASCULAR A: CAD. P:  Cardiology following Amiodarone, Cardizem, Lopressor Lopressor PRN  RENAL A:  Acute renal failure - resolved.  Hypovolemia.  Normal  gap metabolic acidosis (GI losses?) P: Consider supplementing bicarbonate  GASTROINTESTINAL A:  Dysphagia  P:   NPO TF+free wate Consider d/c Protonix as suspicion for C.dif  HEMATOLOGIC A:  Anemia of chronic disease.  P:  Heparin for DVT Px  INFECTIOUS A:  Aspiration pneumonia / HCAP.  P:   Abx / cultures as above C.dif PCR  ENDOCRINE A:  DM. Hyperglycemia. P:   SSI  NEUROLOGIC A:  CVA with residual right sided hemiplegia. P:   No intervention required Consider limiting opioids Valproic acid  PCCM will sign off.  Please reconsult if necessary.  I have personally obtained a history, examined the patient, evaluated laboratory and imaging results, formulated the  assessment and plan and placed orders.  Lonia Farber, MD Pulmonary and Critical Care Medicine [facility] Pager: [phone]  10/16/2013, 11:24 AM

## 2013-10-16 NOTE — Progress Notes (Addendum)
[facility]                                                                                Patient Demographics  Francisco Johnston, is a 79 y.o. male, DOB - 18-Sep-1934, AVW:[id number]  Admit date - 10/11/2013   Admitting Physician Jeralyn Bennett, MD  Outpatient Primary MD for the patient is [name], MD  LOS - 5   Chief Complaint  Patient presents with  . Altered Mental Status       Summary   79 year old African American male who had a hemorrhagic CVA causing right-sided hemiparesis, dysphagia and aphasia few months ago, he was since living at a nursing home, came in on 10/11/2013 from nursing home for fevers, lethargy and workup was suggestive of HCAP, he was started on vancomycin and Zosyn upon admissions, cultures are negative to date, on the night of admission he developed atrial fibrillation with RVR, cardiology was consulted, he required digoxin-amiodarone-Cardizem-Lopressor to control his rate, he's finally converted to sinus, he was transiently hypotensive due to sustained tachycardia.  On day 3 he has developed acute renal failure, despite antibiotics his left-sided infiltrate is much worse and he is febrile again. I have discussed with her son and his prognosis is poor, however he wants aggressive care, pulmonary critical care has been consulted for further input. I have added azithromycin for atypical coverage although this is highly unlikely in the light of him being a nursing home resident for the last few months.    Assessment & Plan     Failure to thrive/functional decline from Met Encephalopathy HCAP  -Patient reportedly have a temperature 101 at his nursing home, developing chills, with lab work showing white count in the 16,000 range, and chest x-ray showing LUL  Infiltrate, since admission he is on IV vancomycin and Zosyn, date cultures are negative, followed by speech therapist, currently changed to Tube feeds per PCCM via NG on 10-14-13. PCCM recommeds G-J  tube as ? Recurrent aspiration, son informed.   However in the morning of 10/14/2013 he looks more lethargic and sick, repeated chest x-ray which shows worsening of left-sided infiltrate. PCCM following and have adjusted his ABX, CXR monitor.     New onset A fib with RVR Cardiology following, on PO amiodarone + Cardizem, stable TSH and echogram with EF of around 60%, he is not a Candidate for anticoagulation due to recent hemorrhagic CVA, into the son clearly. Remains at risk for a new ischemic stroke.  Lab Results  Component Value Date   TSH 1.741 10/12/2013       Hypotension Due to combination of pneumonia along with A. fib with RVR. Blood pressure improved after IV fluids.      Acute renal failure  Likely due to ATN caused by hypotension during his episodes of atrial fibrillation with RVR, resolved post IVF.       History of seizures post recent hemorrhagic CVA with chronic right-sided hemiparesis and aphasia and dysphagia -valproic acid level within therapeutic range -continue his home dose of valproic acid at 750 mg by mouth twice a day. -currently changed to Tube feeds per PCCM via NG on 10-14-13. PCCM recommeds G-J tube as ?  Recurrent aspiration, son informed. -GJ requested via IR      Hypernatremia  Resolved after hydration with initially D5W and now with normal saline.      Hypokalemia  Replaced and stable       Code Status: full, D/W with patient's son. Explained poor prognosis and poor quality of life x 2 to son louis  Family Communication: none  Disposition Plan: inpatient    Procedures echo gram, renal ultrasound ordered, chest x-ray CT head   Consults cardiology, PCCM   Medications  Scheduled Meds: . amiodarone  200 mg Per Tube BID  . antiseptic oral rinse  15 mL Mouth Rinse q12n4p  . aspirin  81 mg Per Tube Daily  . chlorhexidine  15 mL Mouth Rinse BID  . diltiazem  120 mg Per Tube Q8H  . free water  200 mL Per Tube Q6H   . guaifenesin  600 mg Per Tube BID  . heparin subcutaneous  5,000 Units Subcutaneous Q8H  . linezolid  600 mg Intravenous Q12H  . metoprolol tartrate  25 mg Oral BID  . mupirocin ointment  1 application Nasal BID  . pantoprazole sodium  40 mg Per Tube Daily  . piperacillin-tazobactam (ZOSYN)  IV  3.375 g Intravenous Q8H  . sodium chloride  3 mL Intravenous Q12H  . Valproic Acid  750 mg Per Tube BID   Continuous Infusions: . feeding supplement (JEVITY 1.2 CAL) 35 mL/hr at 10/15/13 1800   PRN Meds:.acetaminophen, acetaminophen, alum & mag hydroxide-simeth, metoprolol, ondansetron (ZOFRAN) IV, ondansetron, oxyCODONE, sorbitol  DVT Prophylaxis  SCD  Lab Results  Component Value Date   PLT 140* 10/16/2013    Antibiotics  IV zosyn and IV vancomycin   Anti-infectives   Start     Dose/Rate Route Frequency Ordered Stop   10/16/13 0300  vancomycin (VANCOCIN) IVPB 750 mg/150 ml premix  Status:  Discontinued     750 mg 150 mL/hr over 60 Minutes Intravenous Every 48 hours 10/14/13 1022 10/14/13 1030   10/15/13 1300  piperacillin-tazobactam (ZOSYN) IVPB 3.375 g     3.375 g 12.5 mL/hr over 240 Minutes Intravenous 3 times per day 10/15/13 0943     10/14/13 1200  piperacillin-tazobactam (ZOSYN) IVPB 2.25 g  Status:  Discontinued     2.25 g 100 mL/hr over 30 Minutes Intravenous 4 times per day 10/14/13 1022 10/15/13 0943   10/14/13 1045  linezolid (ZYVOX) IVPB 600 mg     600 mg 300 mL/hr over 60 Minutes Intravenous Every 12 hours 10/14/13 1030     10/14/13 0915  azithromycin (ZITHROMAX) 500 mg in dextrose 5 % 250 mL IVPB  Status:  Discontinued     500 mg 250 mL/hr over 60 Minutes Intravenous Every 24 hours 10/14/13 0910 10/14/13 1030   10/14/13 0421  vancomycin (VANCOCIN) IVPB 750 mg/150 ml premix  Status:  Discontinued     750 mg 150 mL/hr over 60 Minutes Intravenous Every 8 hours 10/13/13 2031 10/14/13 1015   10/12/13 0600  vancomycin (VANCOCIN) IVPB 750 mg/150 ml premix  Status:   Discontinued     750 mg 150 mL/hr over 60 Minutes Intravenous Every 12 hours 10/11/13 2051 10/13/13 2031   10/12/13 0400  piperacillin-tazobactam (ZOSYN) IVPB 3.375 g  Status:  Discontinued     3.375 g 12.5 mL/hr over 240 Minutes Intravenous 3 times per day 10/11/13 2051 10/14/13 1022   10/11/13 1745  vancomycin (VANCOCIN) IVPB 1000 mg/200 mL premix     1,000 mg 200 mL/hr  over 60 Minutes Intravenous  Once 10/11/13 1736 10/11/13 1902   10/11/13 1715  ciprofloxacin (CIPRO) IVPB 400 mg  Status:  Discontinued     400 mg 200 mL/hr over 60 Minutes Intravenous  Once 10/11/13 1700 10/11/13 2002   10/11/13 1715  vancomycin (VANCOCIN) 15 mg/kg in sodium chloride 0.9 % 100 mL IVPB  Status:  Discontinued     15 mg/kg 100 mL/hr over 60 Minutes Intravenous  Once 10/11/13 1700 10/11/13 1735   10/11/13 1715  piperacillin-tazobactam (ZOSYN) IVPB 3.375 g     3.375 g 100 mL/hr over 30 Minutes Intravenous  Once 10/11/13 1700 10/11/13 1803          Subjective:   Guadalupe Dawn -unable to obtain objective.   Objective:   Filed Vitals:   10/16/13 0457 10/16/13 0600 10/16/13 0745 10/16/13 0800  BP:  141/47 144/115 136/53  Pulse:  78 83 77  Temp:   99 F (37.2 C)   TempSrc:   Oral   Resp:  23 28 28   Height:      Weight: 69.7 kg (153 lb 10.6 oz)     SpO2:  97% 94% 92%    Wt Readings from Last 3 Encounters:  10/16/13 69.7 kg (153 lb 10.6 oz)  08/07/13 69.1 kg (152 lb 5.4 oz)  04/05/13 72.122 kg (159 lb)     Intake/Output Summary (Last 24 hours) at 10/16/13 1002 Last data filed at 10/16/13 0900  Gross per 24 hour  Intake   3470 ml  Output    955 ml  Net   2515 ml    Exam Aphasic, Awake, able to node head and follows with gaze, chronic right-sided hemiparesis Supple Neck,No JVD, No cervical lymphadenopathy appriciated.  Symmetrical Chest wall movement, Good air movement bilaterally , coarse breath sounds left more than right Irregular rate and rhythm, No Gallops,Rubs or new Murmurs,  No Parasternal Heave +B.Sounds, Abd Soft, Non tender, No organomegaly appriciated, No rebound - guarding or rigidity. NG in place, has foley No Cyanosis, Clubbing or edema, No new Rash or bruise unable to move right arm and right leg. Muscle tone on the left side intact.    Data Review   Micro Results Recent Results (from the past 240 hour(s))  MRSA PCR SCREENING     Status: Abnormal   Collection Time    10/11/13  8:07 PM      Result Value Range Status   MRSA by PCR POSITIVE (*) NEGATIVE Final   Comment:            The GeneXpert MRSA Assay (FDA     approved for NASAL specimens     only), is one component of a     comprehensive MRSA colonization     surveillance program. It is not     intended to diagnose MRSA     infection nor to guide or     monitor treatment for     MRSA infections.     RESULT CALLED TO, READ BACK BY AND VERIFIED WITH:     ELANEY BY TCLEVELAND 10/12/13 AT 12:28AM  CULTURE, BLOOD (ROUTINE X 2)     Status: None   Collection Time    10/11/13  9:50 PM      Result Value Range Status   Specimen Description BLOOD LEFT HAND   Final   Special Requests BOTTLES DRAWN AEROBIC ONLY 5CC   Final   Culture  Setup Time     Final   Value:  10/12/2013 01:56     Performed at [facility]   Culture     Final   Value:        BLOOD CULTURE RECEIVED NO GROWTH TO DATE CULTURE WILL BE HELD FOR 5 DAYS BEFORE ISSUING A FINAL NEGATIVE REPORT     Performed at [facility]   Report Status PENDING   Incomplete  CULTURE, BLOOD (ROUTINE X 2)     Status: None   Collection Time    10/11/13 10:05 PM      Result Value Range Status   Specimen Description BLOOD LEFT HAND   Final   Special Requests BOTTLES DRAWN AEROBIC ONLY 5CC   Final   Culture  Setup Time     Final   Value: 10/12/2013 01:57     Performed at [facility]   Culture     Final   Value:        BLOOD CULTURE RECEIVED NO GROWTH TO DATE CULTURE WILL BE HELD FOR 5 DAYS BEFORE ISSUING A FINAL NEGATIVE REPORT      Performed at [facility]   Report Status PENDING   Incomplete  CLOSTRIDIUM DIFFICILE BY PCR     Status: None   Collection Time    10/15/13 11:57 PM      Result Value Range Status   C difficile by pcr NEGATIVE  NEGATIVE Final    Radiology Reports Dg Chest 2 View  10/11/2013   CLINICAL DATA:  Altered mental status.  EXAM: CHEST  2 VIEW  COMPARISON:  08/08/2013.  FINDINGS: There is left upper lobe airspace disease. There is no pleural effusion or pneumothorax. The heart size is normal. The osseous structures are unremarkable.  IMPRESSION: Left upper lobe pneumonia. Recommend followup radiography in 4-6 weeks, to document complete resolution following adequate medical therapy. If there is not complete resolution, then recommend further evaluation with CT of the chest to exclude underlying pathology.   Electronically Signed   By: Elige Ko   On: 10/11/2013 15:06   Ct Head Wo Contrast  10/11/2013   CLINICAL DATA:  Slurred speech. Altered mental status.  EXAM: CT HEAD WITHOUT CONTRAST  TECHNIQUE: Contiguous axial images were obtained from the base of the skull through the vertex without intravenous contrast.  COMPARISON:  CT 08/08/2013  FINDINGS: Acute hemorrhage in the left parietal white matter on the prior study has resolved. There is a 2 cm hypodensity in this area. No acute hemorrhage.  Moderate to advanced atrophy. Negative for hydrocephalus. Chronic microvascular ischemic change in the white matter. Negative for acute infarct or mass.  IMPRESSION: Left parietal white matter hypodensity consistent with resolving hematoma compared with the prior study.  Extensive atrophy which has progressed from the prior study. No acute infarct or hemorrhage.   Electronically Signed   By: Marlan Palau M.D.   On: 10/11/2013 16:40    CBC  Recent Labs Lab 10/11/13 1700 10/11/13 2204 10/12/13 0500 10/13/13 0317 10/15/13 0436 10/16/13 0815  WBC 16.4* 13.9* 13.1* 12.3* 19.8* 17.0*  HGB 10.8*  10.9* 11.4* 10.4* 10.8* 10.5*  HCT 33.1* 34.4* 35.4* 32.1* 33.2* 31.6*  PLT 175 153 156 142* 144* 140*  MCV 90.4 92.0 90.8 90.4 89.7 87.5  MCH 29.5 29.1 29.2 29.3 29.2 29.1  MCHC 32.6 31.7 32.2 32.4 32.5 33.2  RDW 15.9* 16.0* 15.9* 15.8* 15.8* 15.4  LYMPHSABS 1.3  --   --   --   --   --   MONOABS 1.3*  --   --   --   --   --  EOSABS 0.1  --   --   --   --   --   BASOSABS 0.0  --   --   --   --   --     Chemistries   Recent Labs Lab 10/11/13 1700  10/12/13 0500 10/13/13 0317 10/14/13 0450 10/15/13 0436 10/16/13 0425  NA 147*  --  153* 146* 132* 143 138  K 4.4  --  4.2 3.1* 3.8 3.2* 5.0  CL 114*  --  118* 113* 91* 108 109  CO2 23  --  24 24 29 20  18*  GLUCOSE 179*  --  100* 113* 236* 101* 126*  BUN 24*  --  20 15 19 10 11   CREATININE 0.75  < > 0.80 0.81 3.26* 0.76 0.63  CALCIUM 7.9*  --  8.4 7.6* 9.3 7.5* 7.0*  MG  --   --   --  2.2 2.0 2.2 2.2  AST 27  --   --   --   --   --   --   ALT 14  --   --   --   --   --   --   ALKPHOS 52  --   --   --   --   --   --   BILITOT 0.6  --   --   --   --   --   --   < > = values in this interval not displayed. ------------------------------------------------------------------------------------------------------------------ estimated creatinine clearance is 77.4 ml/min (by C-G formula based on Cr of 0.63). ------------------------------------------------------------------------------------------------------------------  Coagulation profile  Recent Labs Lab 10/11/13 1700  INR 1.39    Cardiac Enzymes  Recent Labs Lab 10/12/13 0500 10/12/13 0944 10/12/13 1521  TROPONINI <0.30 <0.30 <0.30   ------------------------------------------------------------------------------------------------------------------     Time Spent in minutes   45   Conte, Tessa PA-S on 10/16/2013 at 10:02 AM  Between 7am to 7pm - Pager - [phone]  After 7pm go to [email or web address] - password [email or web address]  And look for the night coverage person  covering for me after hours  [facility] Office  [phone]

## 2013-10-17 ENCOUNTER — Inpatient Hospital Stay (HOSPITAL_COMMUNITY): Payer: Medicare Other

## 2013-10-17 DIAGNOSIS — M7989 Other specified soft tissue disorders: Secondary | ICD-10-CM

## 2013-10-17 LAB — GLUCOSE, CAPILLARY
Glucose-Capillary: 149 mg/dL — ABNORMAL HIGH (ref 70–99)
Glucose-Capillary: 156 mg/dL — ABNORMAL HIGH (ref 70–99)
Glucose-Capillary: 188 mg/dL — ABNORMAL HIGH (ref 70–99)

## 2013-10-17 LAB — CBC
MCH: 28.9 pg (ref 26.0–34.0)
MCHC: 33.4 g/dL (ref 30.0–36.0)
Platelets: 122 10*3/uL — ABNORMAL LOW (ref 150–400)
RBC: 3.43 MIL/uL — ABNORMAL LOW (ref 4.22–5.81)
RDW: 15.7 % — ABNORMAL HIGH (ref 11.5–15.5)
WBC: 15.4 10*3/uL — ABNORMAL HIGH (ref 4.0–10.5)

## 2013-10-17 LAB — BASIC METABOLIC PANEL
CO2: 23 mEq/L (ref 19–32)
Calcium: 7.1 mg/dL — ABNORMAL LOW (ref 8.4–10.5)
Chloride: 109 mEq/L (ref 96–112)
Chloride: 108 mEq/L (ref 96–112)
Creatinine, Ser: 0.86 mg/dL (ref 0.50–1.35)
GFR calc Af Amer: >90 mL/min (ref >90)
GFR calc non Af Amer: 84 mL/min — ABNORMAL LOW (ref >90)
Potassium: 3.7 mEq/L (ref 3.5–5.1)
Potassium: 2.4 mEq/L — CL (ref 3.5–5.1)
Sodium: 142 mEq/L (ref 135–145)
Sodium: 141 mEq/L (ref 135–145)

## 2013-10-17 LAB — PHOSPHORUS: Phosphorus: 2.2 mg/dL — ABNORMAL LOW (ref 2.3–4.6)

## 2013-10-17 LAB — CALCIUM, IONIZED: Calcium, Ion: 1.08 mmol/L — ABNORMAL LOW (ref 1.13–1.30)

## 2013-10-17 LAB — MAGNESIUM: Magnesium: 2.3 mg/dL (ref 1.5–2.5)

## 2013-10-17 MED ORDER — POTASSIUM CHLORIDE 10 MEQ/50ML IV SOLN
INTRAVENOUS | Status: AC
  Administered 2013-10-17: 10 meq
  Filled 2013-10-17: qty 50

## 2013-10-17 MED ORDER — POTASSIUM CHLORIDE 10 MEQ/100ML IV SOLN
10.0000 meq | INTRAVENOUS | Status: AC
Start: 2013-10-17 — End: 2013-10-18
  Administered 2013-10-17 – 2013-10-18 (×5): 10 meq via INTRAVENOUS
  Filled 2013-10-17 (×5): qty 100

## 2013-10-17 NOTE — Clinical Social Work Note (Signed)
CSW called [employer] to verify they got pt's clinicals over [email or web address]; representative explained that case managers are backed up on insurance authorization requests. CSW resubmitted clinicals, requesting a replay, and will call later in the week when pt is closer to discharge to verify he has insurance authorization if CSW does not hear from [facility].   Maryclare Labrador, MSW, [facility] Clinical Social Worker [phone]

## 2013-10-17 NOTE — Progress Notes (Addendum)
Bilateral lower and right upper extremity venous duplex completed.  Right lower extremity:  DVT noted in the common femoral, femoral, popliteal, and posterior tibial veins.  No evidence of superficial thrombosis or a Baker's cyst.  Left lower extremity:  No evidence of DVT, superficial thrombosis, or Baker's cyst. Right upper extremity venous:  No evidence of DVT.  There appears to be superficial thrombosis in the cephalic vein.

## 2013-10-17 NOTE — Procedures (Signed)
Central Venous Catheter Insertion Procedure Note Francisco Johnston [id number] January 15, 1934  Procedure: Insertion of Central Venous Catheter Indications: Assessment of intravascular volume, Drug and/or fluid administration and Frequent blood sampling  Procedure Details Consent: Unable to obtain consent because of altered level of consciousness. Time Out: Verified patient identification, verified procedure, site/side was marked, verified correct patient position, special equipment/implants available, medications/allergies/relevent history reviewed, required imaging and test results available.  Performed  Maximum sterile technique was used including antiseptics, cap, gloves, gown, hand hygiene, mask and sheet. Skin prep: Chlorhexidine; local anesthetic administered A antimicrobial bonded/coated triple lumen catheter was placed in the left internal jugular vein using the Seldinger technique.  Evaluation Blood flow good Complications: No apparent complications Patient did tolerate procedure well. Chest X-ray ordered to verify placement.  CXR: pending.  Rutherford Guys, PA - S 10/17/2013, 9:39 AM  Supervised and was present through the entire procedure.  Lonia Farber, MD Pulmonary and Critical Care Medicine [facility] Pager: [phone]

## 2013-10-17 NOTE — Progress Notes (Signed)
Physical Therapy Treatment Patient Details Name: Francisco Johnston MRN: [id number] DOB: 14-Apr-1934 Today's Date: 10/17/2013 Time: 1040-1100 PT Time Calculation (min): 20 min  PT Assessment / Plan / Recommendation  History of Present Illness Francisco Johnston is a 80 y.o. male with a past medical history of left frontal hemorrhagic CVA, discharged from the stroke service on 08/06/2013 to skilled nursing facility, was transferred to the emergent apartment from his SNF this evening with complaints of overall functional decline/failure to thrive. History was obtained from the patient's son who was present at bedside. Francisco Johnston has become increasingly lethargic, having excessive daytime sleepiness, minimal by mouth intake, having an overall steep decline in the last 4 days. His son notes that he has had chills as well as fever, temperature of 101 at his nursing home   PT Comments   Limited session due to tech present for procedure.  Pt very fatigue however able to open eyes and follow one step commands inconsistently. Pt able to assist with left LE OOB.     Follow Up Recommendations  SNF     Equipment Recommendations  None recommended by PT    Frequency Min 2X/week   Progress towards PT Goals Progress towards PT goals: Progressing toward goals  Plan Current plan remains appropriate    Precautions / Restrictions Precautions Precautions: Fall Precaution Comments: Orange Isolation precautions Restrictions Weight Bearing Restrictions: No   Pertinent Vitals/Pain Pt unable to state if in pain however moan with mobility.    Mobility  Bed Mobility Bed Mobility: Supine to Sit;Sitting - Scoot to [place] of Bed;Sit to Supine;Scooting to [facility] Supine to Sit: 1: +2 Total assist;With rails Supine to Sit: Patient Percentage: 30% Sitting - Scoot to Edge of Bed: 2: Max assist Sit to Supine: 1: +2 Total assist Sit to Supine: Patient Percentage: 10% Scooting to HOB: 1: +1 Total assist Details for Bed  Mobility Assistance: pt helped to sit bringing left leg off bed and pulling up with left hand, but leans to right in sitting; to supine needed assist with both legs and to manage trunk Transfers Transfers: Not assessed (limited due to procedure. ) Ambulation/Gait Ambulation/Gait Assistance: Not tested (comment)    Exercises     PT Diagnosis:    PT Problem List:   PT Treatment Interventions:     PT Goals (current goals can now be found in the care plan section) Acute Rehab PT Goals Patient Stated Goal: Unable to state PT Goal Formulation: Patient unable to participate in goal setting Time For Goal Achievement: Nov 11, 2013 Potential to Achieve Goals: Fair  Visit Information  Last PT Received On: 10/17/13 Assistance Needed: +2 History of Present Illness: Francisco Johnston is a 79 y.o. male with a past medical history of left frontal hemorrhagic CVA, discharged from the stroke service on 08/06/2013 to skilled nursing facility, was transferred to the emergent apartment from his SNF this evening with complaints of overall functional decline/failure to thrive. History was obtained from the patient's son who was present at bedside. Francisco Johnston has become increasingly lethargic, having excessive daytime sleepiness, minimal by mouth intake, having an overall steep decline in the last 4 days. His son notes that he has had chills as well as fever, temperature of 101 at his nursing home    Subjective Data  Patient Stated Goal: Unable to state   Cognition  Cognition Arousal/Alertness: Lethargic Behavior During Therapy: Flat affect Overall Cognitive Status: Difficult to assess (Pt able to follow one step commands inconsistently. )  Difficult to assess due to: Impaired communication    Balance  Balance Balance Assessed: Yes Static Sitting Balance Static Sitting - Balance Support: Left upper extremity supported;Feet supported Static Sitting - Level of Assistance: 3: Mod assist;2: Max assist Static  Sitting - Comment/# of Minutes: Pt needs initially max (A) to maintain upright posture and overall balance due to leaning to right side.   End of Session PT - End of Session Equipment Utilized During Treatment: Oxygen Activity Tolerance: Patient limited by fatigue;Other (comment) (Limited session due to procedure. ) Patient left: in bed;with call bell/phone within reach Nurse Communication: Mobility status   GP     Iyanna Drummer 10/17/2013, 12:21 PM  Jake Shark, PT DPT [phone]

## 2013-10-17 NOTE — Progress Notes (Signed)
Patient ID: Francisco Johnston, male   DOB: December 11, 1933, 79 y.o.   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 Additional hx noted of afib, seizures, colon ca with rt hemicolectomy 2012, CAD, ARF. Exam: pt aphasic, currently sleeping;NG tube in place; chronic rt HP; chest- dim BS bases; heart- tachy, irreg; abd- soft,+BS       Filed Vitals:   10/17/13 0335 10/17/13 0500 10/17/13 0800 10/17/13 1205  BP: 136/47  119/63 103/60  Pulse:   88 101  Temp: 98.9 F (37.2 C)  98.8 F (37.1 C) 98.2 F (36.8 C)  TempSrc: Oral  Oral Oral  Resp:   30 37  Height:      Weight:  158 lb 15.2 oz (72.1 kg)    SpO2:   100% 93%   Past Medical History  Diagnosis Date  . Heart disease   . Hypertension   . Iron deficiency anemia 06/12/2011  . CAD (coronary artery disease)   . Noncompliance   . ED (erectile dysfunction)   . IFG (impaired fasting glucose)   . Lacunar stroke   . Myocardial infarction 1980's X2  . Prostate cancer 1990's    "took radiation tx for that" (10/11/2013)  . Adenocarcinoma of colon 06/12/2011  . Pneumonia     "maybe now, never before" (10/11/2013)  . Stroke 08/06/2013    "paralyzed on the right side since" (10/11/2013)  . Arthritis     "probably; joints" (10/11/2013)  . Chronic kidney disease     "one kidney's smaller than the other; been like that forever" (10/11/2013)   Past Surgical History  Procedure Laterality Date  . Colonoscopy    . Colectomy  ~ 2011  . Cataract extraction Right   . Ankle fracture surgery Left 1990's    "put pins in it" (10/11/2013)   Dg Chest 2 View  10/11/2013   CLINICAL DATA:  Altered mental status.  EXAM: CHEST  2 VIEW  COMPARISON:  08/08/2013.  FINDINGS: There is left upper lobe airspace disease. There is no pleural effusion or pneumothorax. The heart size is normal. The osseous  structures are unremarkable.  IMPRESSION: Left upper lobe pneumonia. Recommend followup radiography in 4-6 weeks, to document complete resolution following adequate medical therapy. If there is not complete resolution, then recommend further evaluation with CT of the chest to exclude underlying pathology.   Electronically Signed   By: Elige Ko   On: 10/11/2013 15:06   Ct Head Wo Contrast  10/11/2013   CLINICAL DATA:  Slurred speech. Altered mental status.  EXAM: CT HEAD WITHOUT CONTRAST  TECHNIQUE: Contiguous axial images were obtained from the base of the skull through the vertex without intravenous contrast.  COMPARISON:  CT 08/08/2013  FINDINGS: Acute hemorrhage in the left parietal white matter on the prior study has resolved. There is a 2 cm hypodensity in this area. No acute hemorrhage.  Moderate to advanced atrophy. Negative for hydrocephalus. Chronic microvascular ischemic change in the white matter. Negative for acute infarct or mass.  IMPRESSION: Left parietal white matter hypodensity consistent with resolving hematoma compared with the prior study.  Extensive atrophy which has progressed from the prior study. No acute infarct or hemorrhage.   Electronically Signed   By: Marlan Palau M.D.   On: 10/11/2013 16:40   US Renal  10/14/2013  CLINICAL DATA:  Acute renal failure  EXAM: RENAL/URINARY TRACT ULTRASOUND COMPLETE  COMPARISON:  None.  FINDINGS: Right Kidney  Length: 9.9. Echogenicity within normal limits. No mass or hydronephrosis visualized.  Left Kidney  Length: 10.5. Echogenicity within normal limits. No mass or hydronephrosis visualized.  Bladder  Decompressed by indwelling Foley catheter.  Additional comments: Left pleural effusion.  IMPRESSION: No hydronephrosis.  Bladder decompressed by indwelling Foley catheter.   Electronically Signed   By: Charline Bills M.D.   On: 10/14/2013 09:51   Dg Chest Port 1 View  10/17/2013   CLINICAL DATA:  Left central line placement  EXAM:  PORTABLE CHEST - 1 VIEW  COMPARISON:  10/17/2013  FINDINGS: New left central line insertion, tip at the proximal SVC level. This could be advanced 6 cm to the SVC RA junction. Stable heart size and vascularity. Dense consolidative airspace process throughout the left lung compatible with pneumonia. Stable right lung aeration. No enlarging effusion or pneumothorax.  IMPRESSION: New left IJ central line tip proximal SVC, could be advanced 6 cm to the SVC RA junction  No significant change in dense left lung airspace process/ pneumonia  No pneumothorax   Electronically Signed   By: Ruel Favors M.D.   On: 10/17/2013 09:07   Dg Chest Port 1 View  10/17/2013   CLINICAL DATA:  Weakness. Shortness of Breath.  EXAM: PORTABLE CHEST - 1 VIEW  COMPARISON:  10/15/2013 and 10/11/2013  FINDINGS: Left lower lobe consolidation may represent atelectasis or infiltrate.  Remainder of the left lung with diffuse airspace disease which may reflect underlying pneumonia.  Right lower lobe airspace disease may represent atelectasis or infiltrate.  Recommend follow-up on to clearance to exclude underlying mass.  Cardiomegaly.  Pulmonary vascular prominence.  Tortuous aorta.  Nasogastric tube tip gastric fundus.  No gross pneumothorax.  IMPRESSION: No significant change since the prior examination in appearance of asymmetric airspace disease (much more notable on the left) as detailed above.   Electronically Signed   By: Bridgett Larsson M.D.   On: 10/17/2013 07:28   Dg Chest Port 1 View  10/15/2013   CLINICAL DATA:  Check tube placement  EXAM: PORTABLE CHEST - 1 VIEW  COMPARISON:  10/14/2013, 10/11/2013.  FINDINGS: There is no endotracheal to present within the field of view. There is a nasogastric tube present with the tip projecting over the stomach. There is gaseous distention of the stomach.  There is mild right basilar airspace disease unchanged in the prior exam. There is diffuse left lung interstitial and alveolar airspace  opacities involving the upper and lower lungs. The heart and mediastinum are stable. The osseous structures demonstrate no focal abnormality.  IMPRESSION: Diffuse left lung interstitial and alveolar airspace opacities concerning for multi lobar pneumonia. There is right lower lobe airspace disease which may reflect atelectasis versus pneumonia.  Nasogastric tube with the tip projecting over the stomach.  No endotracheal tube is present within the field of view.   Electronically Signed   By: Elige Ko   On: 10/15/2013 08:27   Dg Chest Port 1 View  10/14/2013   CLINICAL DATA:  Shortness of breath.  EXAM: PORTABLE CHEST - 1 VIEW  COMPARISON:  10/11/2013  FINDINGS: Significant worsening of left lung pneumonia is noted by chest x-ray now involving essentially the entire left lung. No overt edema or significant associated pleural fluid is identified. The heart size and mediastinal contours are stable.  IMPRESSION: Significant worsening of left lung pneumonia.   Electronically  Signed   By: Irish Lack M.D.   On: 10/14/2013 08:14   Dg Abd Portable 1v  10/14/2013   CLINICAL DATA:  Ileus  EXAM: PORTABLE ABDOMEN - 1 VIEW  COMPARISON:  Prior film same day  FINDINGS: No definite free abdominal air is noted. NG tube in place. Again noted mild gaseous distension of transverse colon.  IMPRESSION: No definite free abdominal air. Again noted mild gaseous distension transverse colon.   Electronically Signed   By: Natasha Mead M.D.   On: 10/14/2013 17:51   Dg Abd Portable 1v  10/14/2013   CLINICAL DATA:  NG tube check.  EXAM: PORTABLE ABDOMEN - 1 VIEW  COMPARISON:  Chest x-ray 10/11/2013 and 08/08/2013  FINDINGS: There has been placement of an enteric tube with tip over the stomach in the left upper quadrant. There is a mildly dilated air-filled transverse colon. There is suggestion of airspace opacification over the left mid to upper lung. There are degenerative changes of the spine.  IMPRESSION: Minimally dilated  air-filled transverse colon as cannot exclude distal colonic obstruction or ileus. Recommend followup abdominal films as clinically indicated.  Airspace opacification over the left mid to upper lung. Recommend correlation with chest radiograph.  Enteric tube with tip over the stomach in the left upper quadrant.   Electronically Signed   By: Elberta Fortis M.D.   On: 10/14/2013 13:30  Results for orders placed during the hospital encounter of 10/11/13  CULTURE, BLOOD (ROUTINE X 2)      Result Value Range   Specimen Description BLOOD LEFT HAND     Special Requests BOTTLES DRAWN AEROBIC ONLY 5CC     Culture  Setup Time       Value: 10/12/2013 01:56     Performed at [facility]   Culture       Value:        BLOOD CULTURE RECEIVED NO GROWTH TO DATE CULTURE WILL BE HELD FOR 5 DAYS BEFORE ISSUING A FINAL NEGATIVE REPORT     Performed at [facility]   Report Status PENDING    CULTURE, BLOOD (ROUTINE X 2)      Result Value Range   Specimen Description BLOOD LEFT HAND     Special Requests BOTTLES DRAWN AEROBIC ONLY 5CC     Culture  Setup Time       Value: 10/12/2013 01:57     Performed at [facility]   Culture       Value:        BLOOD CULTURE RECEIVED NO GROWTH TO DATE CULTURE WILL BE HELD FOR 5 DAYS BEFORE ISSUING A FINAL NEGATIVE REPORT     Performed at [facility]   Report Status PENDING    MRSA PCR SCREENING      Result Value Range   MRSA by PCR POSITIVE (*) NEGATIVE  CLOSTRIDIUM DIFFICILE BY PCR      Result Value Range   C difficile by pcr NEGATIVE  NEGATIVE  CBC WITH DIFFERENTIAL      Result Value Range   WBC 16.4 (*) 4.0 - 10.5 K/uL   RBC 3.66 (*) 4.22 - 5.81 MIL/uL   Hemoglobin 10.8 (*) 13.0 - 17.0 g/dL   HCT 16.1 (*) 09.6 - 04.5 %   MCV 90.4  78.0 - 100.0 fL   MCH 29.5  26.0 - 34.0 pg   MCHC 32.6  30.0 - 36.0 g/dL   RDW 40.9 (*) 81.1 - 91.4 %   Platelets  175  150 - 400 K/uL   Neutrophils Relative % 84 (*) 43 - 77 %   Neutro Abs 13.8 (*) 1.7  - 7.7 K/uL   Lymphocytes Relative 8 (*) 12 - 46 %   Lymphs Abs 1.3  0.7 - 4.0 K/uL   Monocytes Relative 8  3 - 12 %   Monocytes Absolute 1.3 (*) 0.1 - 1.0 K/uL   Eosinophils Relative 0  0 - 5 %   Eosinophils Absolute 0.1  0.0 - 0.7 K/uL   Basophils Relative 0  0 - 1 %   Basophils Absolute 0.0  0.0 - 0.1 K/uL  COMPREHENSIVE METABOLIC PANEL      Result Value Range   Sodium 147 (*) 135 - 145 mEq/L   Potassium 4.4  3.5 - 5.1 mEq/L   Chloride 114 (*) 96 - 112 mEq/L   CO2 23  19 - 32 mEq/L   Glucose, Bld 179 (*) 70 - 99 mg/dL   BUN 24 (*) 6 - 23 mg/dL   Creatinine, Ser 1.61  0.50 - 1.35 mg/dL   Calcium 7.9 (*) 8.4 - 10.5 mg/dL   Total Protein 7.2  6.0 - 8.3 g/dL   Albumin 1.6 (*) 3.5 - 5.2 g/dL   AST 27  0 - 37 U/L   ALT 14  0 - 53 U/L   Alkaline Phosphatase 52  39 - 117 U/L   Total Bilirubin 0.6  0.3 - 1.2 mg/dL   GFR calc non Af Amer 87 (*) >90 mL/min   GFR calc Af Amer >90  >90 mL/min  TROPONIN I      Result Value Range   Troponin I <0.30  <0.30 ng/mL  URINALYSIS, ROUTINE W REFLEX MICROSCOPIC      Result Value Range   Color, Urine AMBER (*) YELLOW   APPearance CLEAR  CLEAR   Specific Gravity, Urine 1.028  1.005 - 1.030   pH 6.0  5.0 - 8.0   Glucose, UA 250 (*) NEGATIVE mg/dL   Hgb urine dipstick NEGATIVE  NEGATIVE   Bilirubin Urine SMALL (*) NEGATIVE   Ketones, ur 15 (*) NEGATIVE mg/dL   Protein, ur 30 (*) NEGATIVE mg/dL   Urobilinogen, UA >0.9 (*) 0.0 - 1.0 mg/dL   Nitrite POSITIVE (*) NEGATIVE   Leukocytes, UA TRACE (*) NEGATIVE  PROTIME-INR      Result Value Range   Prothrombin Time 16.7 (*) 11.6 - 15.2 seconds   INR 1.39  0.00 - 1.49  APTT      Result Value Range   aPTT 32  24 - 37 seconds  VALPROIC ACID LEVEL      Result Value Range   Valproic Acid Lvl 59.4  50.0 - 100.0 ug/mL  URINE RAPID DRUG SCREEN (HOSP PERFORMED)      Result Value Range   Opiates NONE DETECTED  NONE DETECTED   Cocaine NONE DETECTED  NONE DETECTED   Benzodiazepines NONE DETECTED  NONE  DETECTED   Amphetamines NONE DETECTED  NONE DETECTED   Tetrahydrocannabinol NONE DETECTED  NONE DETECTED   Barbiturates NONE DETECTED  NONE DETECTED  GLUCOSE, CAPILLARY      Result Value Range   Glucose-Capillary 61 (*) 70 - 99 mg/dL  GLUCOSE, CAPILLARY      Result Value Range   Glucose-Capillary 123 (*) 70 - 99 mg/dL  URINE MICROSCOPIC-ADD ON      Result Value Range   Squamous Epithelial / LPF RARE  RARE   WBC, UA 0-2  <3  WBC/hpf   RBC / HPF 0-2  <3 RBC/hpf   Bacteria, UA MANY (*) RARE   Urine-Other MUCOUS PRESENT    GLUCOSE, CAPILLARY      Result Value Range   Glucose-Capillary 121 (*) 70 - 99 mg/dL  CBC      Result Value Range   WBC 13.9 (*) 4.0 - 10.5 K/uL   RBC 3.74 (*) 4.22 - 5.81 MIL/uL   Hemoglobin 10.9 (*) 13.0 - 17.0 g/dL   HCT 16.1 (*) 09.6 - 04.5 %   MCV 92.0  78.0 - 100.0 fL   MCH 29.1  26.0 - 34.0 pg   MCHC 31.7  30.0 - 36.0 g/dL   RDW 40.9 (*) 81.1 - 91.4 %   Platelets 153  150 - 400 K/uL  CREATININE, SERUM      Result Value Range   Creatinine, Ser 0.83  0.50 - 1.35 mg/dL   GFR calc non Af Amer 83 (*) >90 mL/min   GFR calc Af Amer >90  >90 mL/min  BASIC METABOLIC PANEL      Result Value Range   Sodium 153 (*) 135 - 145 mEq/L   Potassium 4.2  3.5 - 5.1 mEq/L   Chloride 118 (*) 96 - 112 mEq/L   CO2 24  19 - 32 mEq/L   Glucose, Bld 100 (*) 70 - 99 mg/dL   BUN 20  6 - 23 mg/dL   Creatinine, Ser 7.82  0.50 - 1.35 mg/dL   Calcium 8.4  8.4 - 95.6 mg/dL   GFR calc non Af Amer 85 (*) >90 mL/min   GFR calc Af Amer >90  >90 mL/min  CBC      Result Value Range   WBC 13.1 (*) 4.0 - 10.5 K/uL   RBC 3.90 (*) 4.22 - 5.81 MIL/uL   Hemoglobin 11.4 (*) 13.0 - 17.0 g/dL   HCT 21.3 (*) 08.6 - 57.8 %   MCV 90.8  78.0 - 100.0 fL   MCH 29.2  26.0 - 34.0 pg   MCHC 32.2  30.0 - 36.0 g/dL   RDW 46.9 (*) 62.9 - 52.8 %   Platelets 156  150 - 400 K/uL  TROPONIN I      Result Value Range   Troponin I <0.30  <0.30 ng/mL  TSH      Result Value Range   TSH 1.741  0.350 -  4.500 uIU/mL  TROPONIN I      Result Value Range   Troponin I <0.30  <0.30 ng/mL  TROPONIN I      Result Value Range   Troponin I <0.30  <0.30 ng/mL  SODIUM, URINE, RANDOM      Result Value Range   Sodium, Ur 16    OSMOLALITY, URINE      Result Value Range   Osmolality, Ur 860  390 - 1090 mOsm/kg  CREATININE, URINE, RANDOM      Result Value Range   Creatinine, Urine 177.40    OSMOLALITY      Result Value Range   Osmolality 307 (*) 275 - 300 mOsm/kg  CBC      Result Value Range   WBC 12.3 (*) 4.0 - 10.5 K/uL   RBC 3.55 (*) 4.22 - 5.81 MIL/uL   Hemoglobin 10.4 (*) 13.0 - 17.0 g/dL   HCT 41.3 (*) 24.4 - 01.0 %   MCV 90.4  78.0 - 100.0 fL   MCH 29.3  26.0 - 34.0 pg   MCHC 32.4  30.0 -  36.0 g/dL   RDW 09.8 (*) 11.9 - 14.7 %   Platelets 142 (*) 150 - 400 K/uL  BASIC METABOLIC PANEL      Result Value Range   Sodium 146 (*) 135 - 145 mEq/L   Potassium 3.1 (*) 3.5 - 5.1 mEq/L   Chloride 113 (*) 96 - 112 mEq/L   CO2 24  19 - 32 mEq/L   Glucose, Bld 113 (*) 70 - 99 mg/dL   BUN 15  6 - 23 mg/dL   Creatinine, Ser 8.29  0.50 - 1.35 mg/dL   Calcium 7.6 (*) 8.4 - 10.5 mg/dL   GFR calc non Af Amer 84 (*) >90 mL/min   GFR calc Af Amer >90  >90 mL/min  MAGNESIUM      Result Value Range   Magnesium 2.2  1.5 - 2.5 mg/dL  VANCOMYCIN, TROUGH      Result Value Range   Vancomycin Tr 9.1 (*) 10.0 - 20.0 ug/mL  BASIC METABOLIC PANEL      Result Value Range   Sodium 132 (*) 135 - 145 mEq/L   Potassium 3.8  3.5 - 5.1 mEq/L   Chloride 91 (*) 96 - 112 mEq/L   CO2 29  19 - 32 mEq/L   Glucose, Bld 236 (*) 70 - 99 mg/dL   BUN 19  6 - 23 mg/dL   Creatinine, Ser 5.62 (*) 0.50 - 1.35 mg/dL   Calcium 9.3  8.4 - 13.0 mg/dL   GFR calc non Af Amer 17 (*) >90 mL/min   GFR calc Af Amer 20 (*) >90 mL/min  MAGNESIUM      Result Value Range   Magnesium 2.0  1.5 - 2.5 mg/dL  OSMOLALITY      Result Value Range   Osmolality 296  275 - 300 mOsm/kg  CREATININE, URINE, RANDOM      Result Value Range    Creatinine, Urine 210.28    OSMOLALITY, URINE      Result Value Range   Osmolality, Ur 650  390 - 1090 mOsm/kg  SODIUM, URINE, RANDOM      Result Value Range   Sodium, Ur <10    URINALYSIS W MICROSCOPIC + REFLEX CULTURE      Result Value Range   Color, Urine AMBER (*) YELLOW   APPearance CLOUDY (*) CLEAR   Specific Gravity, Urine 1.039 (*) 1.005 - 1.030   pH 6.0  5.0 - 8.0   Glucose, UA NEGATIVE  NEGATIVE mg/dL   Hgb urine dipstick NEGATIVE  NEGATIVE   Bilirubin Urine SMALL (*) NEGATIVE   Ketones, ur 15 (*) NEGATIVE mg/dL   Protein, ur 30 (*) NEGATIVE mg/dL   Urobilinogen, UA 1.0  0.0 - 1.0 mg/dL   Nitrite NEGATIVE  NEGATIVE   Leukocytes, UA NEGATIVE  NEGATIVE   WBC, UA 0-2  <3 WBC/hpf   RBC / HPF 0-2  <3 RBC/hpf   Squamous Epithelial / LPF RARE  RARE  PROCALCITONIN      Result Value Range   Procalcitonin 0.42    SEDIMENTATION RATE      Result Value Range   Sed Rate 108 (*) 0 - 16 mm/hr  GLUCOSE, CAPILLARY      Result Value Range   Glucose-Capillary 130 (*) 70 - 99 mg/dL   Comment 1 Notify RN    BASIC METABOLIC PANEL      Result Value Range   Sodium 143  135 - 145 mEq/L   Potassium 3.2 (*) 3.5 - 5.1  mEq/L   Chloride 108  96 - 112 mEq/L   CO2 20  19 - 32 mEq/L   Glucose, Bld 101 (*) 70 - 99 mg/dL   BUN 10  6 - 23 mg/dL   Creatinine, Ser 1.61  0.50 - 1.35 mg/dL   Calcium 7.5 (*) 8.4 - 10.5 mg/dL   GFR calc non Af Amer 86 (*) >90 mL/min   GFR calc Af Amer >90  >90 mL/min  CBC      Result Value Range   WBC 19.8 (*) 4.0 - 10.5 K/uL   RBC 3.70 (*) 4.22 - 5.81 MIL/uL   Hemoglobin 10.8 (*) 13.0 - 17.0 g/dL   HCT 09.6 (*) 04.5 - 40.9 %   MCV 89.7  78.0 - 100.0 fL   MCH 29.2  26.0 - 34.0 pg   MCHC 32.5  30.0 - 36.0 g/dL   RDW 81.1 (*) 91.4 - 78.2 %   Platelets 144 (*) 150 - 400 K/uL  PHOSPHORUS      Result Value Range   Phosphorus 2.7  2.3 - 4.6 mg/dL  MAGNESIUM      Result Value Range   Magnesium 2.2  1.5 - 2.5 mg/dL  GLUCOSE, CAPILLARY      Result Value  Range   Glucose-Capillary 140 (*) 70 - 99 mg/dL   Comment 1 Notify RN    PROCALCITONIN      Result Value Range   Procalcitonin 3.42    GLUCOSE, CAPILLARY      Result Value Range   Glucose-Capillary 123 (*) 70 - 99 mg/dL  GLUCOSE, CAPILLARY      Result Value Range   Glucose-Capillary 139 (*) 70 - 99 mg/dL  GLUCOSE, CAPILLARY      Result Value Range   Glucose-Capillary 121 (*) 70 - 99 mg/dL  GLUCOSE, CAPILLARY      Result Value Range   Glucose-Capillary 109 (*) 70 - 99 mg/dL  GLUCOSE, CAPILLARY      Result Value Range   Glucose-Capillary 87  70 - 99 mg/dL  BASIC METABOLIC PANEL      Result Value Range   Sodium 138  135 - 145 mEq/L   Potassium 5.0  3.5 - 5.1 mEq/L   Chloride 109  96 - 112 mEq/L   CO2 18 (*) 19 - 32 mEq/L   Glucose, Bld 126 (*) 70 - 99 mg/dL   BUN 11  6 - 23 mg/dL   Creatinine, Ser 9.56  0.50 - 1.35 mg/dL   Calcium 7.0 (*) 8.4 - 10.5 mg/dL   GFR calc non Af Amer >90  >90 mL/min   GFR calc Af Amer >90  >90 mL/min  PHOSPHORUS      Result Value Range   Phosphorus 2.7  2.3 - 4.6 mg/dL  MAGNESIUM      Result Value Range   Magnesium 2.2  1.5 - 2.5 mg/dL  GLUCOSE, CAPILLARY      Result Value Range   Glucose-Capillary 106 (*) 70 - 99 mg/dL  GLUCOSE, CAPILLARY      Result Value Range   Glucose-Capillary 120 (*) 70 - 99 mg/dL  GLUCOSE, CAPILLARY      Result Value Range   Glucose-Capillary 160 (*) 70 - 99 mg/dL  GLUCOSE, CAPILLARY      Result Value Range   Glucose-Capillary 119 (*) 70 - 99 mg/dL  CBC      Result Value Range   WBC 17.0 (*) 4.0 - 10.5 K/uL   RBC 3.61 (*)  4.22 - 5.81 MIL/uL   Hemoglobin 10.5 (*) 13.0 - 17.0 g/dL   HCT 40.9 (*) 81.1 - 91.4 %   MCV 87.5  78.0 - 100.0 fL   MCH 29.1  26.0 - 34.0 pg   MCHC 33.2  30.0 - 36.0 g/dL   RDW 78.2  95.6 - 21.3 %   Platelets 140 (*) 150 - 400 K/uL  PROCALCITONIN      Result Value Range   Procalcitonin 3.64    GLUCOSE, CAPILLARY      Result Value Range   Glucose-Capillary 128 (*) 70 - 99 mg/dL   GLUCOSE, CAPILLARY      Result Value Range   Glucose-Capillary 127 (*) 70 - 99 mg/dL  BASIC METABOLIC PANEL      Result Value Range   Sodium 142  135 - 145 mEq/L   Potassium 3.7  3.5 - 5.1 mEq/L   Chloride 109  96 - 112 mEq/L   CO2 20  19 - 32 mEq/L   Glucose, Bld 153 (*) 70 - 99 mg/dL   BUN 14  6 - 23 mg/dL   Creatinine, Ser 0.86  0.50 - 1.35 mg/dL   Calcium 7.3 (*) 8.4 - 10.5 mg/dL   GFR calc non Af Amer 84 (*) >90 mL/min   GFR calc Af Amer >90  >90 mL/min  PHOSPHORUS      Result Value Range   Phosphorus 2.2 (*) 2.3 - 4.6 mg/dL  MAGNESIUM      Result Value Range   Magnesium 2.3  1.5 - 2.5 mg/dL  CBC      Result Value Range   WBC 15.4 (*) 4.0 - 10.5 K/uL   RBC 3.43 (*) 4.22 - 5.81 MIL/uL   Hemoglobin 9.9 (*) 13.0 - 17.0 g/dL   HCT 57.8 (*) 46.9 - 62.9 %   MCV 86.3  78.0 - 100.0 fL   MCH 28.9  26.0 - 34.0 pg   MCHC 33.4  30.0 - 36.0 g/dL   RDW 52.8 (*) 41.3 - 24.4 %   Platelets 122 (*) 150 - 400 K/uL  GLUCOSE, CAPILLARY      Result Value Range   Glucose-Capillary 136 (*) 70 - 99 mg/dL  GLUCOSE, CAPILLARY      Result Value Range   Glucose-Capillary 128 (*) 70 - 99 mg/dL  GLUCOSE, CAPILLARY      Result Value Range   Glucose-Capillary 188 (*) 70 - 99 mg/dL  CALCIUM, IONIZED      Result Value Range   Calcium, Ion 1.08 (*) 1.13 - 1.30 mmol/L  GLUCOSE, CAPILLARY      Result Value Range   Glucose-Capillary 160 (*) 70 - 99 mg/dL  GLUCOSE, CAPILLARY      Result Value Range   Glucose-Capillary 170 (*) 70 - 99 mg/dL  POCT I-STAT TROPONIN I      Result Value Range   Troponin i, poc 0.00  0.00 - 0.08 ng/mL   Comment 3            A/P: Pt with hx of hemorrhagic CVA with rt HP 07/2013, dysphagia, aphasia, PNA with high asp risk ,FTT,PCM, afib, colon ca -s/p left hemicolectomy 2012, newly diagnosed RLE DVT, prior renal failure. Tent plan is for IVC filter placement on 11/12 and possible G-J tube later this week. Details/risks of procedures were d/w pt's son, Freemon Binford,  with his understanding and consent.

## 2013-10-17 NOTE — Progress Notes (Addendum)
[facility]                                                                                Patient Demographics  Francisco Johnston, is a 79 y.o. male, DOB - 06/07/1934, ZOX:[id number]  Admit date - 10/11/2013   Admitting Physician Jeralyn Bennett, MD  Outpatient Primary MD for the patient is [name], MD  LOS - 6   Chief Complaint  Patient presents with  . Altered Mental Status       Summary   79 year old African American male who had a hemorrhagic CVA causing right-sided hemiparesis, dysphagia and aphasia few months ago, he was since living at a nursing home, came in on 10/11/2013 from nursing home for fevers, lethargy and workup was suggestive of HCAP, he was started on vancomycin and Zosyn upon admissions, cultures are negative to date, on the night of admission he developed atrial fibrillation with RVR, cardiology was consulted, he required digoxin-amiodarone-Cardizem-Lopressor to control his rate, he's finally converted to sinus, he was transiently hypotensive due to sustained tachycardia.  On day 3 he has developed acute renal failure, despite antibiotics his left-sided infiltrate is much worse and he is febrile again. I have discussed with her son and his prognosis is poor, however he wants aggressive care, pulmonary critical care has been consulted for further input.  His chest x-ray continues to show diffuse interstitial pattern worse on the left side of unclear etiology, pulmonary critical care has somehow signed off on the case on 10/16/2013. I will request them again to continue monitoring the patient closely as his primary problem seems to be pulmonary in nature.    Assessment & Plan     Failure to thrive/functional decline from Met Encephalopathy HCAP  -Patient reportedly have a temperature 101 at his nursing home, developing chills, with lab work showing white count in the 16,000 range, and chest x-ray showing LUL  Infiltrate, since admission he is on IV  vancomycin and Zosyn, date cultures are negative, followed by speech therapist, currently changed to Tube feeds per PCCM via NG on 10-14-13. PCCM recommeds G-J tube as ? Recurrent aspiration, son informed.   However in the morning of 10/14/2013 he looks more lethargic and sick, repeated chest x-ray which shows worsening of left-sided infiltrate. PCCM following and have adjusted his ABX, CXR continues to show bilateral diffuse interstitial pattern left worse than right, continue present antibiotics and supportive care.  GJ tube to be placed by IR once they deemed him to be relatively infection free.      New onset A fib with RVR Cardiology following, on PO amiodarone + Cardizem, stable TSH and echogram with EF of around 60%, he is not a Candidate for anticoagulation due to recent hemorrhagic CVA, into the son clearly. Remains at risk for a new ischemic stroke.  Lab Results  Component Value Date   TSH 1.741 10/12/2013       Hypotension Due to combination of pneumonia along with A. fib with RVR. Blood pressure improved after IV fluids.      Acute renal failure  Likely due to ATN caused by hypotension during his episodes of atrial fibrillation with RVR, resolved post IVF.  History of seizures post recent hemorrhagic CVA with chronic right-sided hemiparesis and aphasia and dysphagia -valproic acid level within therapeutic range -continue his home dose of valproic acid at 750 mg by mouth twice a day. -currently changed to Tube feeds per PCCM via NG on 10-14-13. PCCM recommeds G-J tube as ? Recurrent aspiration, son informed, GJ requested via IR      Hypernatremia  Resolved after hydration with initially D5W and now with normal saline.      Hypokalemia  Replaced and stable     Right arm & Leg swelling    I have requested pulmonary critical care on 10/17/2013 to put a central line, have requested nursing staff to discontinue all IV access in the right arm,  will check right upper extremity ultrasound to rule out DVT. If he has DVT he will be an extremely poor candidate for full term anticoagulation in the light of his intracerebral bleed which happened in August of 2014.  Addendum-  right upper extremity ultrasound is unremarkable however he has a large DVT in the right leg. Note he has hemiparesis on the right side. He has SCDs bilaterally, at this time I discussed his case with neurologist Dr. Pearlean Brownie who had taken care of him during his hemorrhagic CVA few months ago, he wants me that he remains very high risk for repeat bleed on full anticoagulation. IVC filter will be placed.    Low calcium levels.  Check ionized calcium as abdomen 2 low due to severe protein calorie malnutrition.    Severe protein calorie malnutrition  Dietitian on board getting tube feeds through NG for now.       Code Status: full, D/W with patient's son. Explained poor prognosis and poor quality of life x 2 to son Mr.[name].  D/W 2nd son Jewel Baize on 10-17-13 - ok for IVC filter, he understands poor prognosis, he will D/W Family about DNR and possible Hospice if he continues to decline.    Family Communication: none  Disposition Plan: inpatient    Procedures echo gram, renal ultrasound ordered, chest x-ray CT head, C line requested 10-17-13, G-J tube requested IR following   Consults cardiology, PCCM, IR   Medications  Scheduled Meds: . amiodarone  200 mg Per Tube BID  . antiseptic oral rinse  15 mL Mouth Rinse q12n4p  . aspirin  81 mg Per Tube Daily  . chlorhexidine  15 mL Mouth Rinse BID  . diltiazem  120 mg Per Tube Q8H  . feeding supplement (PRO-STAT SUGAR FREE 64)  30 mL Per Tube BID  . free water  200 mL Per Tube Q6H  . guaifenesin  600 mg Per Tube BID  . heparin subcutaneous  5,000 Units Subcutaneous Q8H  . linezolid  600 mg Intravenous Q12H  . metoprolol tartrate  25 mg Oral BID  . pantoprazole sodium  40 mg Per Tube Daily  .  piperacillin-tazobactam (ZOSYN)  IV  3.375 g Intravenous Q8H  . sodium chloride  3 mL Intravenous Q12H  . Valproic Acid  750 mg Per Tube BID   Continuous Infusions: . feeding supplement (JEVITY 1.2 CAL) 55 mL/hr at 10/16/13 1350   PRN Meds:.acetaminophen, acetaminophen, alum & mag hydroxide-simeth, metoprolol, ondansetron (ZOFRAN) IV, ondansetron, oxyCODONE, sorbitol  DVT Prophylaxis  SCD  Lab Results  Component Value Date   PLT 122* 10/17/2013    Antibiotics  IV zosyn and IV vancomycin   Anti-infectives   Start     Dose/Rate Route Frequency Ordered Stop  10/16/13 0300  vancomycin (VANCOCIN) IVPB 750 mg/150 ml premix  Status:  Discontinued     750 mg 150 mL/hr over 60 Minutes Intravenous Every 48 hours 10/14/13 1022 10/14/13 1030   10/15/13 1300  piperacillin-tazobactam (ZOSYN) IVPB 3.375 g     3.375 g 12.5 mL/hr over 240 Minutes Intravenous 3 times per day 10/15/13 0943     10/14/13 1200  piperacillin-tazobactam (ZOSYN) IVPB 2.25 g  Status:  Discontinued     2.25 g 100 mL/hr over 30 Minutes Intravenous 4 times per day 10/14/13 1022 10/15/13 0943   10/14/13 1045  linezolid (ZYVOX) IVPB 600 mg     600 mg 300 mL/hr over 60 Minutes Intravenous Every 12 hours 10/14/13 1030     10/14/13 0915  azithromycin (ZITHROMAX) 500 mg in dextrose 5 % 250 mL IVPB  Status:  Discontinued     500 mg 250 mL/hr over 60 Minutes Intravenous Every 24 hours 10/14/13 0910 10/14/13 1030   10/14/13 0421  vancomycin (VANCOCIN) IVPB 750 mg/150 ml premix  Status:  Discontinued     750 mg 150 mL/hr over 60 Minutes Intravenous Every 8 hours 10/13/13 2031 10/14/13 1015   10/12/13 0600  vancomycin (VANCOCIN) IVPB 750 mg/150 ml premix  Status:  Discontinued     750 mg 150 mL/hr over 60 Minutes Intravenous Every 12 hours 10/11/13 2051 10/13/13 2031   10/12/13 0400  piperacillin-tazobactam (ZOSYN) IVPB 3.375 g  Status:  Discontinued     3.375 g 12.5 mL/hr over 240 Minutes Intravenous 3 times per day 10/11/13  2051 10/14/13 1022   10/11/13 1745  vancomycin (VANCOCIN) IVPB 1000 mg/200 mL premix     1,000 mg 200 mL/hr over 60 Minutes Intravenous  Once 10/11/13 1736 10/11/13 1902   10/11/13 1715  ciprofloxacin (CIPRO) IVPB 400 mg  Status:  Discontinued     400 mg 200 mL/hr over 60 Minutes Intravenous  Once 10/11/13 1700 10/11/13 2002   10/11/13 1715  vancomycin (VANCOCIN) 15 mg/kg in sodium chloride 0.9 % 100 mL IVPB  Status:  Discontinued     15 mg/kg 100 mL/hr over 60 Minutes Intravenous  Once 10/11/13 1700 10/11/13 1735   10/11/13 1715  piperacillin-tazobactam (ZOSYN) IVPB 3.375 g     3.375 g 100 mL/hr over 30 Minutes Intravenous  Once 10/11/13 1700 10/11/13 1803          Subjective:   Guadalupe Dawn -nods no to headache, chest or abdominal pain. Denies shortness of breath.   Objective:   Filed Vitals:   10/16/13 2351 10/17/13 0335 10/17/13 0500 10/17/13 0800  BP: 121/50 136/47  119/63  Pulse:    88  Temp: 99.9 F (37.7 C) 98.9 F (37.2 C)  98.8 F (37.1 C)  TempSrc: Oral Oral  Oral  Resp:    30  Height:      Weight:   72.1 kg (158 lb 15.2 oz)   SpO2:    100%    Wt Readings from Last 3 Encounters:  10/17/13 72.1 kg (158 lb 15.2 oz)  08/07/13 69.1 kg (152 lb 5.4 oz)  04/05/13 72.122 kg (159 lb)     Intake/Output Summary (Last 24 hours) at 10/17/13 0845 Last data filed at 10/17/13 0800  Gross per 24 hour  Intake 2347.67 ml  Output    295 ml  Net 2052.67 ml    Exam Aphasic, Awake, able to node head and follows with gaze, chronic right-sided hemiparesis and aphasia Supple Neck,No JVD, No cervical lymphadenopathy appriciated.  Symmetrical Chest wall movement, Good air movement bilaterally , coarse breath sounds left more than right Irregular rate and rhythm, No Gallops,Rubs or new Murmurs, No Parasternal Heave +B.Sounds, Abd Soft, Non tender, No organomegaly appriciated, No rebound - guarding or rigidity. NG in place, has foley No Cyanosis, Clubbing or edema, No new  Rash or bruise unable to move right arm and right leg. Muscle tone on the left side intact.  R.arm and leg swollen  Data Review   Micro Results Recent Results (from the past 240 hour(s))  MRSA PCR SCREENING     Status: Abnormal   Collection Time    10/11/13  8:07 PM      Result Value Range Status   MRSA by PCR POSITIVE (*) NEGATIVE Final   Comment:            The GeneXpert MRSA Assay (FDA     approved for NASAL specimens     only), is one component of a     comprehensive MRSA colonization     surveillance program. It is not     intended to diagnose MRSA     infection nor to guide or     monitor treatment for     MRSA infections.     RESULT CALLED TO, READ BACK BY AND VERIFIED WITH:     ELANEY BY TCLEVELAND 10/12/13 AT 12:28AM  CULTURE, BLOOD (ROUTINE X 2)     Status: None   Collection Time    10/11/13  9:50 PM      Result Value Range Status   Specimen Description BLOOD LEFT HAND   Final   Special Requests BOTTLES DRAWN AEROBIC ONLY 5CC   Final   Culture  Setup Time     Final   Value: 10/12/2013 01:56     Performed at [facility]   Culture     Final   Value:        BLOOD CULTURE RECEIVED NO GROWTH TO DATE CULTURE WILL BE HELD FOR 5 DAYS BEFORE ISSUING A FINAL NEGATIVE REPORT     Performed at [facility]   Report Status PENDING   Incomplete  CULTURE, BLOOD (ROUTINE X 2)     Status: None   Collection Time    10/11/13 10:05 PM      Result Value Range Status   Specimen Description BLOOD LEFT HAND   Final   Special Requests BOTTLES DRAWN AEROBIC ONLY 5CC   Final   Culture  Setup Time     Final   Value: 10/12/2013 01:57     Performed at [facility]   Culture     Final   Value:        BLOOD CULTURE RECEIVED NO GROWTH TO DATE CULTURE WILL BE HELD FOR 5 DAYS BEFORE ISSUING A FINAL NEGATIVE REPORT     Performed at [facility]   Report Status PENDING   Incomplete  CLOSTRIDIUM DIFFICILE BY PCR     Status: None   Collection Time    10/15/13  11:57 PM      Result Value Range Status   C difficile by pcr NEGATIVE  NEGATIVE Final    Radiology Reports Dg Chest 2 View  10/11/2013   CLINICAL DATA:  Altered mental status.  EXAM: CHEST  2 VIEW  COMPARISON:  08/08/2013.  FINDINGS: There is left upper lobe airspace disease. There is no pleural effusion or pneumothorax. The heart size is normal. The osseous structures are unremarkable.  IMPRESSION: Left upper lobe pneumonia. Recommend followup radiography in 4-6 weeks, to document complete resolution following adequate medical therapy. If there is not complete resolution, then recommend further evaluation with CT of the chest to exclude underlying pathology.   Electronically Signed   By: Elige Ko   On: 10/11/2013 15:06   Ct Head Wo Contrast  10/11/2013   CLINICAL DATA:  Slurred speech. Altered mental status.  EXAM: CT HEAD WITHOUT CONTRAST  TECHNIQUE: Contiguous axial images were obtained from the base of the skull through the vertex without intravenous contrast.  COMPARISON:  CT 08/08/2013  FINDINGS: Acute hemorrhage in the left parietal white matter on the prior study has resolved. There is a 2 cm hypodensity in this area. No acute hemorrhage.  Moderate to advanced atrophy. Negative for hydrocephalus. Chronic microvascular ischemic change in the white matter. Negative for acute infarct or mass.  IMPRESSION: Left parietal white matter hypodensity consistent with resolving hematoma compared with the prior study.  Extensive atrophy which has progressed from the prior study. No acute infarct or hemorrhage.   Electronically Signed   By: Marlan Palau M.D.   On: 10/11/2013 16:40    CBC  Recent Labs Lab 10/11/13 1700  10/12/13 0500 10/13/13 0317 10/15/13 0436 10/16/13 0815 10/17/13 0545  WBC 16.4*  < > 13.1* 12.3* 19.8* 17.0* 15.4*  HGB 10.8*  < > 11.4* 10.4* 10.8* 10.5* 9.9*  HCT 33.1*  < > 35.4* 32.1* 33.2* 31.6* 29.6*  PLT 175  < > 156 142* 144* 140* 122*  MCV 90.4  < > 90.8 90.4 89.7  87.5 86.3  MCH 29.5  < > 29.2 29.3 29.2 29.1 28.9  MCHC 32.6  < > 32.2 32.4 32.5 33.2 33.4  RDW 15.9*  < > 15.9* 15.8* 15.8* 15.4 15.7*  LYMPHSABS 1.3  --   --   --   --   --   --   MONOABS 1.3*  --   --   --   --   --   --   EOSABS 0.1  --   --   --   --   --   --   BASOSABS 0.0  --   --   --   --   --   --   < > = values in this interval not displayed.  Chemistries   Recent Labs Lab 10/11/13 1700  10/13/13 0317 10/14/13 0450 10/15/13 0436 10/16/13 0425 10/17/13 0545  NA 147*  < > 146* 132* 143 138 142  K 4.4  < > 3.1* 3.8 3.2* 5.0 3.7  CL 114*  < > 113* 91* 108 109 109  CO2 23  < > 24 29 20  18* 20  GLUCOSE 179*  < > 113* 236* 101* 126* 153*  BUN 24*  < > 15 19 10 11 14   CREATININE 0.75  < > 0.81 3.26* 0.76 0.63 0.82  CALCIUM 7.9*  < > 7.6* 9.3 7.5* 7.0* 7.3*  MG  --   --  2.2 2.0 2.2 2.2 2.3  AST 27  --   --   --   --   --   --   ALT 14  --   --   --   --   --   --   ALKPHOS 52  --   --   --   --   --   --   BILITOT 0.6  --   --   --   --   --   --   < > =  values in this interval not displayed. ------------------------------------------------------------------------------------------------------------------ estimated creatinine clearance is 78.2 ml/min (by C-G formula based on Cr of 0.82). ------------------------------------------------------------------------------------------------------------------  Coagulation profile  Recent Labs Lab 10/11/13 1700  INR 1.39    Cardiac Enzymes  Recent Labs Lab 10/12/13 0500 10/12/13 0944 10/12/13 1521  TROPONINI <0.30 <0.30 <0.30   ------------------------------------------------------------------------------------------------------------------     Time Spent in minutes   45   Asa Lente, Tessa PA-S on 10/17/2013 at 8:45 AM  Between 7am to 7pm - Pager - [phone]  After 7pm go to [email or web address] - password [email or web address]  And look for the night coverage person covering for me after hours  [facility] Office   [phone]

## 2013-10-17 NOTE — Clinical Social Work Note (Addendum)
CSW received a phone call from [place], admissions rep from [facility], who wanted an update on pt. CSW explained that discharge will likely be next week, and that he will not be leaving the hospital in the next few days. CSW explained that CSW is not medically trained to know when discharge will occur, but that from conversations with RNCM this seems to be a good estimate. Tresa Endo explained that the facility is undergoing renovations, and that they only have one male bed at this time, and the pt's family is not holding a bed. Tresa Endo is doing everything she can to ensure pt has a bed when he discharges. CSW will call Tresa Endo [phone]) at the end of the week to update her on pt's discharge date and to see if pt will need a different facility if [facility] unable to provide bed.  Addendum: CSW submitted PT note to Tresa Endo to get insurance auth for SNF.  Maryclare Labrador, MSW, [facility] Clinical Social Worker [phone]

## 2013-10-18 ENCOUNTER — Inpatient Hospital Stay (HOSPITAL_COMMUNITY): Payer: Medicare Other

## 2013-10-18 ENCOUNTER — Ambulatory Visit (HOSPITAL_COMMUNITY): Payer: Medicare Other

## 2013-10-18 LAB — CULTURE, BLOOD (ROUTINE X 2): Culture: NO GROWTH

## 2013-10-18 LAB — GLUCOSE, CAPILLARY
Glucose-Capillary: 106 mg/dL — ABNORMAL HIGH (ref 70–99)
Glucose-Capillary: 135 mg/dL — ABNORMAL HIGH (ref 70–99)
Glucose-Capillary: 196 mg/dL — ABNORMAL HIGH (ref 70–99)
Glucose-Capillary: 85 mg/dL (ref 70–99)
Glucose-Capillary: 97 mg/dL (ref 70–99)

## 2013-10-18 LAB — BASIC METABOLIC PANEL
BUN: 16 mg/dL (ref 6–23)
CO2: 24 mEq/L (ref 19–32)
Calcium: 6.9 mg/dL — ABNORMAL LOW (ref 8.4–10.5)
Chloride: 106 mEq/L (ref 96–112)
Glucose, Bld: 175 mg/dL — ABNORMAL HIGH (ref 70–99)
Potassium: 2.7 mEq/L — CL (ref 3.5–5.1)
Sodium: 140 mEq/L (ref 135–145)

## 2013-10-18 LAB — CBC
Hemoglobin: 9.1 g/dL — ABNORMAL LOW (ref 13.0–17.0)
MCH: 29 pg (ref 26.0–34.0)
MCHC: 33.2 g/dL (ref 30.0–36.0)
MCV: 87.3 fL (ref 78.0–100.0)
Platelets: 127 10*3/uL — ABNORMAL LOW (ref 150–400)
RBC: 3.14 MIL/uL — ABNORMAL LOW (ref 4.22–5.81)
WBC: 15.6 10*3/uL — ABNORMAL HIGH (ref 4.0–10.5)

## 2013-10-18 LAB — APTT: aPTT: 31 seconds (ref 24–37)

## 2013-10-18 LAB — POTASSIUM
Potassium: 2.6 mEq/L — CL (ref 3.5–5.1)
Potassium: 3.1 mEq/L — ABNORMAL LOW (ref 3.5–5.1)

## 2013-10-18 MED ORDER — MIDAZOLAM HCL 2 MG/2ML IJ SOLN
INTRAMUSCULAR | Status: AC
  Filled 2013-10-18: qty 2

## 2013-10-18 MED ORDER — FENTANYL CITRATE 0.05 MG/ML IJ SOLN
INTRAMUSCULAR | Status: AC
  Filled 2013-10-18: qty 2

## 2013-10-18 MED ORDER — POTASSIUM CHLORIDE 10 MEQ/100ML IV SOLN
10.0000 meq | INTRAVENOUS | Status: AC
  Administered 2013-10-18 (×4): 10 meq via INTRAVENOUS
  Filled 2013-10-18 (×4): qty 100

## 2013-10-18 MED ORDER — POTASSIUM CHLORIDE 20 MEQ/15ML (10%) PO LIQD
40.0000 meq | Freq: Three times a day (TID) | ORAL | Status: DC
  Administered 2013-10-18 – 2013-10-19 (×5): 40 meq via ORAL
  Filled 2013-10-18 (×8): qty 30

## 2013-10-18 MED ORDER — HEPARIN SODIUM (PORCINE) 5000 UNIT/ML IJ SOLN
5000.0000 [IU] | Freq: Three times a day (TID) | INTRAMUSCULAR | Status: AC
  Administered 2013-10-18 (×2): 5000 [IU] via SUBCUTANEOUS
  Filled 2013-10-18 (×2): qty 1

## 2013-10-18 MED ORDER — HEPARIN SODIUM (PORCINE) 5000 UNIT/ML IJ SOLN
5000.0000 [IU] | Freq: Three times a day (TID) | INTRAMUSCULAR | Status: DC
  Filled 2013-10-18: qty 1

## 2013-10-18 MED ORDER — MIDAZOLAM HCL 2 MG/2ML IJ SOLN
INTRAMUSCULAR | Status: AC | PRN
  Administered 2013-10-18: 1 mg via INTRAVENOUS

## 2013-10-18 MED ORDER — FENTANYL CITRATE 0.05 MG/ML IJ SOLN
INTRAMUSCULAR | Status: AC | PRN
  Administered 2013-10-18: 50 ug via INTRAVENOUS

## 2013-10-18 MED ORDER — IOHEXOL 300 MG/ML  SOLN
100.0000 mL | Freq: Once | INTRAMUSCULAR | Status: AC | PRN
  Administered 2013-10-18: 50 mL via INTRAVENOUS

## 2013-10-18 MED ORDER — POTASSIUM CHLORIDE 20 MEQ/15ML (10%) PO LIQD
40.0000 meq | Freq: Two times a day (BID) | ORAL | Status: DC
  Administered 2013-10-18: 40 meq via ORAL
  Filled 2013-10-18 (×2): qty 30

## 2013-10-18 NOTE — ED Notes (Signed)
report

## 2013-10-18 NOTE — Clinical Social Work Note (Signed)
Per RNCM, pt to get a feeding tube following his infection--CSW continues to follow case.   Maryclare Labrador, MSW, [facility] Clinical Social Worker [phone]

## 2013-10-18 NOTE — Progress Notes (Signed)
Nutrition Brief Note  RD following pt on nutrition support.  Pt requiring NGT replacement yesterday. Planning for PEG placement later this week.   RD notes current prescription of zyvox, a medication with potential interactions with high tyramine-containing foods.  Pt is currently on TFs- Jevity 1.2 which is not high in tyramine.  No changes warranted at this time.   Continue current interventions.  RD to follow.   Loyce Dys, MS RD LDN Clinical Inpatient Dietitian Pager: [phone] Weekend/After hours pager: [phone]

## 2013-10-18 NOTE — Progress Notes (Signed)
Came into pt's room to find NGT in pt;s leflt hand. New #12 NGT inserted into right nare.  Placement  With air bolus v verified by RNs x 2.  KUB ordered for placement prior to restarting tube feeds.  Green mitten applied to left hand.

## 2013-10-18 NOTE — Progress Notes (Signed)
ANTIBIOTIC CONSULT NOTE - FOLLOW UP  Pharmacy Consult for Zosyn  Indication: pneumonia  No Known Allergies  Patient Measurements: Height: 6\' 2"  (188 cm) Weight: 160 lb 11.5 oz (72.9 kg) IBW/kg (Calculated) : 82.2  Vital Signs: Temp: 97.7 F (36.5 C) (11/12 0800) Temp src: Oral (11/12 0800) BP: 125/61 mmHg (11/12 0800) Pulse Rate: 79 (11/12 0800) Intake/Output from previous day: 11/11 0701 - 11/12 0700 In: 4095 [NG/GT:2270; IV Piggyback:1825] Out: 1000 [Urine:1000] Intake/Output from this shift: Total I/O In: 112.5 [IV Piggyback:112.5] Out: -   Labs:  Recent Labs  10/16/13 0815 10/17/13 0545 10/17/13 1817 10/18/13 0355  WBC 17.0* 15.4*  --  15.6*  HGB 10.5* 9.9*  --  9.1*  PLT 140* 122*  --  127*  CREATININE  --  0.82 0.86 0.81   Estimated Creatinine Clearance: 80 ml/min (by C-G formula based on Cr of 0.81). No results found for this basename: VANCOTROUGH, Leodis Binet, VANCORANDOM, GENTTROUGH, GENTPEAK, GENTRANDOM, TOBRATROUGH, TOBRAPEAK, TOBRARND, AMIKACINPEAK, AMIKACINTROU, AMIKACIN,  in the last 72 hours   Microbiology: Recent Results (from the past 720 hour(s))  MRSA PCR SCREENING     Status: Abnormal   Collection Time    10/11/13  8:07 PM      Result Value Range Status   MRSA by PCR POSITIVE (*) NEGATIVE Final   Comment:            The GeneXpert MRSA Assay (FDA     approved for NASAL specimens     only), is one component of a     comprehensive MRSA colonization     surveillance program. It is not     intended to diagnose MRSA     infection nor to guide or     monitor treatment for     MRSA infections.     RESULT CALLED TO, READ BACK BY AND VERIFIED WITH:     ELANEY BY [facility] 10/12/13 AT 12:28AM  CULTURE, BLOOD (ROUTINE X 2)     Status: None   Collection Time    10/11/13  9:50 PM      Result Value Range Status   Specimen Description BLOOD LEFT HAND   Final   Special Requests BOTTLES DRAWN AEROBIC ONLY 5CC   Final   Culture  Setup Time     Final    Value: 10/12/2013 01:56     Performed at [facility]   Culture     Final   Value: NO GROWTH 5 DAYS     Performed at [facility]   Report Status 10/18/2013 FINAL   Final  CULTURE, BLOOD (ROUTINE X 2)     Status: None   Collection Time    10/11/13 10:05 PM      Result Value Range Status   Specimen Description BLOOD LEFT HAND   Final   Special Requests BOTTLES DRAWN AEROBIC ONLY 5CC   Final   Culture  Setup Time     Final   Value: 10/12/2013 01:57     Performed at [facility]   Culture     Final   Value: NO GROWTH 5 DAYS     Performed at [facility]   Report Status 10/18/2013 FINAL   Final  CLOSTRIDIUM DIFFICILE BY PCR     Status: None   Collection Time    10/15/13 11:57 PM      Result Value Range Status   C difficile by pcr NEGATIVE  NEGATIVE Final    Anti-infectives  Start     Dose/Rate Route Frequency Ordered Stop   10/16/13 0300  vancomycin (VANCOCIN) IVPB 750 mg/150 ml premix  Status:  Discontinued     750 mg 150 mL/hr over 60 Minutes Intravenous Every 48 hours 10/14/13 1022 10/14/13 1030   10/15/13 1300  piperacillin-tazobactam (ZOSYN) IVPB 3.375 g     3.375 g 12.5 mL/hr over 240 Minutes Intravenous 3 times per day 10/15/13 0943     10/14/13 1200  piperacillin-tazobactam (ZOSYN) IVPB 2.25 g  Status:  Discontinued     2.25 g 100 mL/hr over 30 Minutes Intravenous 4 times per day 10/14/13 1022 10/15/13 0943   10/14/13 1045  linezolid (ZYVOX) IVPB 600 mg     600 mg 300 mL/hr over 60 Minutes Intravenous Every 12 hours 10/14/13 1030     10/14/13 0915  azithromycin (ZITHROMAX) 500 mg in dextrose 5 % 250 mL IVPB  Status:  Discontinued     500 mg 250 mL/hr over 60 Minutes Intravenous Every 24 hours 10/14/13 0910 10/14/13 1030   10/14/13 0421  vancomycin (VANCOCIN) IVPB 750 mg/150 ml premix  Status:  Discontinued     750 mg 150 mL/hr over 60 Minutes Intravenous Every 8 hours 10/13/13 2031 10/14/13 1015   10/12/13 0600  vancomycin  (VANCOCIN) IVPB 750 mg/150 ml premix  Status:  Discontinued     750 mg 150 mL/hr over 60 Minutes Intravenous Every 12 hours 10/11/13 2051 10/13/13 2031   10/12/13 0400  piperacillin-tazobactam (ZOSYN) IVPB 3.375 g  Status:  Discontinued     3.375 g 12.5 mL/hr over 240 Minutes Intravenous 3 times per day 10/11/13 2051 10/14/13 1022   10/11/13 1745  vancomycin (VANCOCIN) IVPB 1000 mg/200 mL premix     1,000 mg 200 mL/hr over 60 Minutes Intravenous  Once 10/11/13 1736 10/11/13 1902   10/11/13 1715  ciprofloxacin (CIPRO) IVPB 400 mg  Status:  Discontinued     400 mg 200 mL/hr over 60 Minutes Intravenous  Once 10/11/13 1700 10/11/13 2002   10/11/13 1715  vancomycin (VANCOCIN) 15 mg/kg in sodium chloride 0.9 % 100 mL IVPB  Status:  Discontinued     15 mg/kg 100 mL/hr over 60 Minutes Intravenous  Once 10/11/13 1700 10/11/13 1735   10/11/13 1715  piperacillin-tazobactam (ZOSYN) IVPB 3.375 g     3.375 g 100 mL/hr over 30 Minutes Intravenous  Once 10/11/13 1700 10/11/13 1803      79yo male admitted 10/11/2013 with lethargy, minimal intake and failure to thrive. His family states he has also had fever and chills. Pharmacy consulted to dose vanc/zosyn  PMH:: hemorrhagic CVA 8/14, HTN, CAD, Colon CA,   Events:  Plan for IVC filter 11/12  and G-J tube later this week  Anticoagulation: none pta > VTE Px, SQ heparin , h/h &  plts, low but stable, follow closely on zyvox  Infectious Disease: Pneumonia, Afebrile, WBC stable ~15, CCM changed vanc to zyvox due to worsening infiltrates on vanc  Vancomycin 11/5>11/8 11/8 Zyvox>> Zosyn 11/5>  11/5 BC x 2 > ngtd  Cardiovascular: CAD, HTN, PAF hx,  amiodarone, ASA, dilt 120 q8, metop HR=70-130s, BP 100-120s  Endocrinology: glucose <200, no coverage, no DM hx  Gastrointestinal / Nutrition: Protonix, LFTs wnl, TF @ 60 ml/hr  Neurology: hx of CVA 8/14, dced to SNF at that time, with aphasia, on depakote (level 59.4) 750 mg bid  Nephrology: CrCl  75-80 ml/min, ARF resolved 11/9 with IV fluids , K consistently low, replacing PO and IV; Mg  2.3  Pulmonary: RA  PTA Medication Issues:Home medications not ordered:  Amlodipine 5 daily,  Best Practices: sq heparin  Goal of Therapy: Renal adjustment of antibiotics.   Plan: - continue zosyn3.375g IV q8 - continue zyvox 600mg  IV q12    Thank you for allowing pharmacy to be a part of this patients care team.  Lovenia Kim Pharm.D., BCPS Clinical Pharmacist 10/18/2013 8:52 AM Pager: [phone] Phone: [phone]

## 2013-10-18 NOTE — ED Notes (Signed)
Report given to [place] RN on 2H; pt to go to CT after filter placed. [place] RN stated that pt is allowed to travel without RN at bedside.

## 2013-10-18 NOTE — Progress Notes (Signed)
CRITICAL VALUE ALERT  Critical value received:  K 2.6  Date of notification:  10/18/2013  Time of notification:  0700  Critical value read back: Yes  Nurse who received alert:  [place] Delayne Sanzo RN    MD notified (1st page): Dr aware. Lab redrawn per Dr. Jarrett Ables.

## 2013-10-18 NOTE — Progress Notes (Signed)
Patient ID: Francisco Johnston  male  ZOX:[id number]    DOB: 1934-10-21    DOA: 10/11/2013  PCP: [name], MD  Interval history 79 year old African American male who had a hemorrhagic CVA causing right-sided hemiparesis, dysphagia and aphasia in 07/2013, from skilled nursing facility, admitted on 10/11/2013 from nursing home for fevers, lethargy and workup was suggestive of HCAP, he was started on vancomycin and Zosyn upon admissions, cultures are negative to date, on the night of admission he developed atrial fibrillation with RVR, cardiology was consulted, he required digoxin-amiodarone-Cardizem-Lopressor to control his rate, he's finally converted to sinus, he was transiently hypotensive due to sustained tachycardia.  On day 3 he has developed acute renal failure, despite antibiotics,left-sided infiltrate is much worse.   Assessment/Plan:  Failure to thrive/functional decline from Met Encephalopathy HCAP  - Patient reportedly had fever, chills at SNF, leukocytosis, chest x-ray showing LUL Infiltrate - since admission he is on IV vancomycin and Zosyn, cultures are negative, followed by speech therapist, currently changed to Tube feeds per PCCM via NG on 10-14-13.  - PCCM recommeded G-J tube, IR to place tomorrow  New onset A fib with RVR  - Cardiology following, on PO amiodarone + Cardizem, stable TSH  - 2-D echo with EF of 55-60%, normal wall motion, not a Candidate for anticoagulation due to recent hemorrhagic CVA. Remains at risk for a new ischemic stroke  Hypotension  Due to combination of pneumonia along with A. fib with RVR. Blood pressure improved after IV fluids.   Acute renal failure  Likely due to ATN caused by hypotension during his episodes of atrial fibrillation with RVR, resolved post IVF.   History of seizures post recent hemorrhagic CVA with chronic right-sided hemiparesis and aphasia and dysphagia  -valproic acid level within therapeutic range  -continue his home dose of  valproic acid at 750 mg by mouth twice a day.  -currently changed to Tube feeds. PCCM recommeded G-J tube as ? Recurrent aspiration, IR to place tomorrow   Hypernatremia  Resolved after hydration with initially D5W and now with normal saline.   Severe recurrent Hypokalemia: Likely due to diarrhea - Placed on oral potassium replacement TID and Kcl IV, will recheck it later today  Right arm & Leg swelling  RUE [place] is unremarkable however he has a large DVT in the right leg. Note he has hemiparesis on the right side. He has SCDs bilaterally, at this time, Dr Thedore Mins discussed his case with neurologist Dr. Pearlean Brownie who had taken care of him during his hemorrhagic CVA few months ago, per Dr Pearlean Brownie, he remains very high risk for repeat bleed on full anticoagulation.  - IVC filter placed today by IR  Severe protein calorie malnutrition   tube feeds through NG for now.   DVT Prophylaxis: Heparin subcutaneous  Code Status: Full CODE STATUS  Disposition:    Subjective: No acute events overnight, afebrile  Objective: Weight change: 0.8 kg (1 lb 12.2 oz)  Intake/Output Summary (Last 24 hours) at 10/18/13 1604 Last data filed at 10/18/13 1100  Gross per 24 hour  Intake 2829.9 ml  Output    550 ml  Net 2279.9 ml   Blood pressure 117/58, pulse 75, temperature 99.3 F (37.4 C), temperature source Oral, resp. rate 19, height 6\' 2"  (1.88 m), weight 72.9 kg (160 lb 11.5 oz), SpO2 96.00%.  Physical Exam: General: Chronic right-sided hemiparesis and aphasia, nonverbal CVS: S1-S2 clear, no murmur rubs or gallops Chest: clear to auscultation bilaterally, no wheezing, rales  or rhonchi Abdomen: soft nontender, nondistended, normal bowel sounds  Extremities: no cyanosis, clubbing or edema noted bilaterally  Lab Results: Basic Metabolic Panel:  Recent Labs Lab 10/17/13 0545 10/17/13 1817 10/18/13 0355 10/18/13 0600  NA 142 141 140  --   K 3.7 2.4* 2.7* 2.6*  CL 109 108 106  --   CO2 20 23 24    --   GLUCOSE 153* 173* 175*  --   BUN 14 16 16   --   CREATININE 0.82 0.86 0.81  --   CALCIUM 7.3* 7.1* 6.9*  --   MG 2.3  --   --   --   PHOS 2.2*  --   --   --    Liver Function Tests:  Recent Labs Lab 10/11/13 1700  AST 27  ALT 14  ALKPHOS 52  BILITOT 0.6  PROT 7.2  ALBUMIN 1.6*   No results found for this basename: LIPASE, AMYLASE,  in the last 168 hours No results found for this basename: AMMONIA,  in the last 168 hours CBC:  Recent Labs Lab 10/11/13 1700  10/17/13 0545 10/18/13 0355  WBC 16.4*  < > 15.4* 15.6*  NEUTROABS 13.8*  --   --   --   HGB 10.8*  < > 9.9* 9.1*  HCT 33.1*  < > 29.6* 27.4*  MCV 90.4  < > 86.3 87.3  PLT 175  < > 122* 127*  < > = values in this interval not displayed. Cardiac Enzymes:  Recent Labs Lab 10/12/13 0500 10/12/13 0944 10/12/13 1521  TROPONINI <0.30 <0.30 <0.30   BNP: No components found with this basename: POCBNP,  CBG:  Recent Labs Lab 10/17/13 1645 10/17/13 1939 10/17/13 2340 10/18/13 0335 10/18/13 0822  GLUCAP 149* 156* 196* 135* 135*     Micro Results: Recent Results (from the past 240 hour(s))  MRSA PCR SCREENING     Status: Abnormal   Collection Time    10/11/13  8:07 PM      Result Value Range Status   MRSA by PCR POSITIVE (*) NEGATIVE Final   Comment:            The GeneXpert MRSA Assay (FDA     approved for NASAL specimens     only), is one component of a     comprehensive MRSA colonization     surveillance program. It is not     intended to diagnose MRSA     infection nor to guide or     monitor treatment for     MRSA infections.     RESULT CALLED TO, READ BACK BY AND VERIFIED WITH:     ELANEY BY TCLEVELAND 10/12/13 AT 12:28AM  CULTURE, BLOOD (ROUTINE X 2)     Status: None   Collection Time    10/11/13  9:50 PM      Result Value Range Status   Specimen Description BLOOD LEFT HAND   Final   Special Requests BOTTLES DRAWN AEROBIC ONLY 5CC   Final   Culture  Setup Time     Final   Value:  10/12/2013 01:56     Performed at [facility]   Culture     Final   Value: NO GROWTH 5 DAYS     Performed at [facility]   Report Status 10/18/2013 FINAL   Final  CULTURE, BLOOD (ROUTINE X 2)     Status: None   Collection Time    10/11/13 10:05 PM  Result Value Range Status   Specimen Description BLOOD LEFT HAND   Final   Special Requests BOTTLES DRAWN AEROBIC ONLY 5CC   Final   Culture  Setup Time     Final   Value: 10/12/2013 01:57     Performed at [facility]   Culture     Final   Value: NO GROWTH 5 DAYS     Performed at [facility]   Report Status 10/18/2013 FINAL   Final  CLOSTRIDIUM DIFFICILE BY PCR     Status: None   Collection Time    10/15/13 11:57 PM      Result Value Range Status   C difficile by pcr NEGATIVE  NEGATIVE Final    Studies/Results: Ct Abdomen Wo Contrast  10/18/2013   CLINICAL DATA:  Evaluate anatomy for GJ tube.  EXAM: CT ABDOMEN WITHOUT CONTRAST  TECHNIQUE: Multidetector CT imaging of the abdomen was performed following the standard protocol without IV contrast.  COMPARISON:  CT 10/11/2012  FINDINGS: There are small to moderate bilateral pleural effusions, left larger than right. Compressive atelectasis or infiltrates in the lower lobes. Heart is borderline in size.  NG tube is present in the stomach with the tip in the region of the antrum. The stomach is decompressed distally, partially distended proximally. There is gaseous distention of the colon which overlies much of the stomach. Visualized small bowel is unremarkable.  IVC filter is in place.  Aorta is normal caliber.  Layering gallstones within the gallbladder. Liver, spleen, pancreas, adrenals and kidneys have an unremarkable unenhanced appearance. Trace ascites in the right paracolic gutter and inferior to the right liver.  Degenerative changes in the thoracolumbar spine, most pronounced in the lower lumbar spine.  IMPRESSION: NG tube present within the stomach  with the tip in the antrum.  Gaseous distention of the transverse colon which overlies much of the stomach.  Trace ascites in the right abdomen.  Cholelithiasis.   Electronically Signed   By: Charlett Nose M.D.   On: 10/18/2013 15:29   Dg Chest 2 View  10/11/2013   CLINICAL DATA:  Altered mental status.  EXAM: CHEST  2 VIEW  COMPARISON:  08/08/2013.  FINDINGS: There is left upper lobe airspace disease. There is no pleural effusion or pneumothorax. The heart size is normal. The osseous structures are unremarkable.  IMPRESSION: Left upper lobe pneumonia. Recommend followup radiography in 4-6 weeks, to document complete resolution following adequate medical therapy. If there is not complete resolution, then recommend further evaluation with CT of the chest to exclude underlying pathology.   Electronically Signed   By: Elige Ko   On: 10/11/2013 15:06   Dg Abd 1 View  10/18/2013   CLINICAL DATA:  NG placement  EXAM: ABDOMEN - 1 VIEW  COMPARISON:  10/14/2013  FINDINGS: NG tube tip in the antrum of the stomach. The stomach is mildly distended contains retained food.  Adynamic ileus with dilated right colon. Mild small bowel distention.  IMPRESSION: NG tube tip in the gastric antrum.  Ileus   Electronically Signed   By: Marlan Palau M.D.   On: 10/18/2013 08:26   Ct Head Wo Contrast  10/11/2013   CLINICAL DATA:  Slurred speech. Altered mental status.  EXAM: CT HEAD WITHOUT CONTRAST  TECHNIQUE: Contiguous axial images were obtained from the base of the skull through the vertex without intravenous contrast.  COMPARISON:  CT 08/08/2013  FINDINGS: Acute hemorrhage in the left parietal white matter on the prior study  has resolved. There is a 2 cm hypodensity in this area. No acute hemorrhage.  Moderate to advanced atrophy. Negative for hydrocephalus. Chronic microvascular ischemic change in the white matter. Negative for acute infarct or mass.  IMPRESSION: Left parietal white matter hypodensity consistent with  resolving hematoma compared with the prior study.  Extensive atrophy which has progressed from the prior study. No acute infarct or hemorrhage.   Electronically Signed   By: Marlan Palau M.D.   On: 10/11/2013 16:40   Ir Ivc Filter Plmt / S&i /img Guid/mod Sed  10/18/2013   INDICATION: 79 year old male with history of recent hemorrhagic stroke and now acute lower extremity DVT. With a contraindication to anticoagulation, he requires caval interruption for PE prophylaxis.  EXAM: ULTRASOUND GUIDANCE FOR VASCULAR ACCESS  IVC CATHETERIZATION AND VENOGRAM  IVC FILTER INSERTION  MEDICATIONS: Fentanyl 1 mcg IV; Versed 50 mg IV  ANESTHESIA/SEDATION: Sedation Time  10 minutes  CONTRAST:  50mL OMNIPAQUE IOHEXOL 300 MG/ML  SOLN  COMPARISON:  CT head 08/07/2013; CT abdomen/ pelvis 10/11/2012  FLUOROSCOPY TIME:  30 seconds  PROCEDURE: Informed consent was obtained from the patient following explanation of the procedure, risks, benefits and alternatives. The patient understands, agrees and consents for the procedure. All questions were addressed. A time out was performed prior to the initiation of the procedure.  Maximal barrier sterile technique utilized including caps, mask, sterile gowns, sterile gloves, large sterile drape, hand hygiene, and Betadine prep.  Under sterile condition and local anesthesia, right internal jugular venous access was performed with ultrasound. An ultrasound image was saved and sent to PACS. Over a guidewire, the IVC filter delivery sheath and inner dilator were advanced into the IVC just above the IVC bifurcation. Contrast injection was performed for an IVC venogram.  Through the delivery sheath, a retrievable Denali IVC filter was deployed below the level of the renal veins and above the IVC bifurcation. Limited post deployment venacavagram was performed.  The delivery sheath was removed and hemostasis was obtained with manual compression. A dressing was placed. The patient tolerated the  procedure well without immediate post procedural complication.  COMPLICATIONS: None immediate  FINDINGS: The IVC is patent. No evidence of thrombus, stenosis, or occlusion. No variant venous anatomy. Successful placement of the IVC filter below the level of the renal veins.  IMPRESSION: Successful ultrasound and fluoroscopically guided placement of an infrarenal retrievable IVC filter via right jugular approach.  Given the patient's history of prior hemorrhagic stroke, this filter should be considered a permanent device.  Signed,  Sterling Big, MD  [facility]  [facility]   Electronically Signed   By: Malachy Moan M.D.   On: 10/18/2013 15:28   US Renal  10/14/2013   CLINICAL DATA:  Acute renal failure  EXAM: RENAL/URINARY TRACT ULTRASOUND COMPLETE  COMPARISON:  None.  FINDINGS: Right Kidney  Length: 9.9. Echogenicity within normal limits. No mass or hydronephrosis visualized.  Left Kidney  Length: 10.5. Echogenicity within normal limits. No mass or hydronephrosis visualized.  Bladder  Decompressed by indwelling Foley catheter.  Additional comments: Left pleural effusion.  IMPRESSION: No hydronephrosis.  Bladder decompressed by indwelling Foley catheter.   Electronically Signed   By: Charline Bills M.D.   On: 10/14/2013 09:51   Dg Chest Port 1 View  10/17/2013   CLINICAL DATA:  Left central line placement  EXAM: PORTABLE CHEST - 1 VIEW  COMPARISON:  10/17/2013  FINDINGS: New left central line insertion, tip at the proximal SVC  level. This could be advanced 6 cm to the SVC RA junction. Stable heart size and vascularity. Dense consolidative airspace process throughout the left lung compatible with pneumonia. Stable right lung aeration. No enlarging effusion or pneumothorax.  IMPRESSION: New left IJ central line tip proximal SVC, could be advanced 6 cm to the SVC RA junction  No significant change in dense left lung airspace process/ pneumonia  No  pneumothorax   Electronically Signed   By: Ruel Favors M.D.   On: 10/17/2013 09:07   Dg Chest Port 1 View  10/17/2013   CLINICAL DATA:  Weakness. Shortness of Breath.  EXAM: PORTABLE CHEST - 1 VIEW  COMPARISON:  10/15/2013 and 10/11/2013  FINDINGS: Left lower lobe consolidation may represent atelectasis or infiltrate.  Remainder of the left lung with diffuse airspace disease which may reflect underlying pneumonia.  Right lower lobe airspace disease may represent atelectasis or infiltrate.  Recommend follow-up on to clearance to exclude underlying mass.  Cardiomegaly.  Pulmonary vascular prominence.  Tortuous aorta.  Nasogastric tube tip gastric fundus.  No gross pneumothorax.  IMPRESSION: No significant change since the prior examination in appearance of asymmetric airspace disease (much more notable on the left) as detailed above.   Electronically Signed   By: Bridgett Larsson M.D.   On: 10/17/2013 07:28   Dg Chest Port 1 View  10/15/2013   CLINICAL DATA:  Check tube placement  EXAM: PORTABLE CHEST - 1 VIEW  COMPARISON:  10/14/2013, 10/11/2013.  FINDINGS: There is no endotracheal to present within the field of view. There is a nasogastric tube present with the tip projecting over the stomach. There is gaseous distention of the stomach.  There is mild right basilar airspace disease unchanged in the prior exam. There is diffuse left lung interstitial and alveolar airspace opacities involving the upper and lower lungs. The heart and mediastinum are stable. The osseous structures demonstrate no focal abnormality.  IMPRESSION: Diffuse left lung interstitial and alveolar airspace opacities concerning for multi lobar pneumonia. There is right lower lobe airspace disease which may reflect atelectasis versus pneumonia.  Nasogastric tube with the tip projecting over the stomach.  No endotracheal tube is present within the field of view.   Electronically Signed   By: Elige Ko   On: 10/15/2013 08:27   Dg Chest Port  1 View  10/14/2013   CLINICAL DATA:  Shortness of breath.  EXAM: PORTABLE CHEST - 1 VIEW  COMPARISON:  10/11/2013  FINDINGS: Significant worsening of left lung pneumonia is noted by chest x-ray now involving essentially the entire left lung. No overt edema or significant associated pleural fluid is identified. The heart size and mediastinal contours are stable.  IMPRESSION: Significant worsening of left lung pneumonia.   Electronically Signed   By: Irish Lack M.D.   On: 10/14/2013 08:14   Dg Abd Portable 1v  10/14/2013   CLINICAL DATA:  Ileus  EXAM: PORTABLE ABDOMEN - 1 VIEW  COMPARISON:  Prior film same day  FINDINGS: No definite free abdominal air is noted. NG tube in place. Again noted mild gaseous distension of transverse colon.  IMPRESSION: No definite free abdominal air. Again noted mild gaseous distension transverse colon.   Electronically Signed   By: Natasha Mead M.D.   On: 10/14/2013 17:51   Dg Abd Portable 1v  10/14/2013   CLINICAL DATA:  NG tube check.  EXAM: PORTABLE ABDOMEN - 1 VIEW  COMPARISON:  Chest x-ray 10/11/2013 and 08/08/2013  FINDINGS: There has been placement  of an enteric tube with tip over the stomach in the left upper quadrant. There is a mildly dilated air-filled transverse colon. There is suggestion of airspace opacification over the left mid to upper lung. There are degenerative changes of the spine.  IMPRESSION: Minimally dilated air-filled transverse colon as cannot exclude distal colonic obstruction or ileus. Recommend followup abdominal films as clinically indicated.  Airspace opacification over the left mid to upper lung. Recommend correlation with chest radiograph.  Enteric tube with tip over the stomach in the left upper quadrant.   Electronically Signed   By: Elberta Fortis M.D.   On: 10/14/2013 13:30    Medications: Scheduled Meds: . amiodarone  200 mg Per Tube BID  . antiseptic oral rinse  15 mL Mouth Rinse q12n4p  . aspirin  81 mg Per Tube Daily  .  chlorhexidine  15 mL Mouth Rinse BID  . diltiazem  120 mg Per Tube Q8H  . feeding supplement (PRO-STAT SUGAR FREE 64)  30 mL Per Tube BID  . fentaNYL      . free water  200 mL Per Tube Q6H  . guaifenesin  600 mg Per Tube BID  . heparin subcutaneous  5,000 Units Subcutaneous Q8H  . [START ON 10/20/2013] heparin subcutaneous  5,000 Units Subcutaneous Q8H  . linezolid  600 mg Intravenous Q12H  . metoprolol tartrate  25 mg Oral BID  . midazolam      . pantoprazole sodium  40 mg Per Tube Daily  . piperacillin-tazobactam (ZOSYN)  IV  3.375 g Intravenous Q8H  . potassium chloride  40 mEq Oral BID  . sodium chloride  3 mL Intravenous Q12H  . Valproic Acid  750 mg Per Tube BID      LOS: 7 days   Octivia Canion M.D. [facility] 10/18/2013, 4:04 PM Pager: [phone]  If 7PM-7AM, please contact night-coverage [email or web address] Password [email or web address]

## 2013-10-19 ENCOUNTER — Inpatient Hospital Stay (HOSPITAL_COMMUNITY): Payer: Medicare Other

## 2013-10-19 LAB — GLUCOSE, CAPILLARY
Glucose-Capillary: 132 mg/dL — ABNORMAL HIGH (ref 70–99)
Glucose-Capillary: 138 mg/dL — ABNORMAL HIGH (ref 70–99)
Glucose-Capillary: 152 mg/dL — ABNORMAL HIGH (ref 70–99)
Glucose-Capillary: 170 mg/dL — ABNORMAL HIGH (ref 70–99)

## 2013-10-19 LAB — BASIC METABOLIC PANEL
BUN: 14 mg/dL (ref 6–23)
Calcium: 7.2 mg/dL — ABNORMAL LOW (ref 8.4–10.5)
Chloride: 108 mEq/L (ref 96–112)
GFR calc Af Amer: >90 mL/min (ref >90)
Glucose, Bld: 166 mg/dL — ABNORMAL HIGH (ref 70–99)
Potassium: 3.6 mEq/L (ref 3.5–5.1)
Sodium: 140 mEq/L (ref 135–145)

## 2013-10-19 LAB — CBC
HCT: 27.2 % — ABNORMAL LOW (ref 39.0–52.0)
Hemoglobin: 9.2 g/dL — ABNORMAL LOW (ref 13.0–17.0)
MCH: 29.3 pg (ref 26.0–34.0)
MCHC: 33.8 g/dL (ref 30.0–36.0)
Platelets: 90 10*3/uL — ABNORMAL LOW (ref 150–400)
RBC: 3.14 MIL/uL — ABNORMAL LOW (ref 4.22–5.81)

## 2013-10-19 MED ORDER — HEPARIN SODIUM (PORCINE) 5000 UNIT/ML IJ SOLN: 5000.0000 [IU] | Freq: Three times a day (TID) | INTRAMUSCULAR | Status: DC

## 2013-10-19 MED ORDER — HEPARIN SODIUM (PORCINE) 5000 UNIT/ML IJ SOLN
5000.0000 [IU] | Freq: Three times a day (TID) | INTRAMUSCULAR | Status: DC
  Administered 2013-10-20 – 2013-10-21 (×4): 5000 [IU] via SUBCUTANEOUS
  Filled 2013-10-19 (×7): qty 1

## 2013-10-19 MED ORDER — HEPARIN SODIUM (PORCINE) 5000 UNIT/ML IJ SOLN
5000.0000 [IU] | Freq: Three times a day (TID) | INTRAMUSCULAR | Status: DC
  Filled 2013-10-19 (×2): qty 1

## 2013-10-19 NOTE — Progress Notes (Signed)
Got a call from Radiology PA to stop suction and restart tube feeding. PEG insertion will be  tomorrow

## 2013-10-19 NOTE — Progress Notes (Signed)
Placed on right lat. Lower  Position  And NGt on low gomco suction. Cont. To monitor.

## 2013-10-19 NOTE — Clinical Social Work Note (Addendum)
CSW received call from [facility] that pt is authorized to go back to [facility] at discharge.  Addendum: CSW will call Tresa Endo (admissions rep at [facility]) tomorrow to update her pt has insurance auth and to ask if she believes he will have a bed at time of discharge as family is not holding a bed. If not, CSW might need to search for new SNF.  Addendum: CSW received a call from [place] (admissions [facility]), who says [employer] alerted her insurance authorization and pt can come back to facility when ready for discharge. Facility should have bed; CSW will call Tresa Endo when discharge is closer and confirm pt can come back.  Addendum: CSW received call from [facility] saying pt may go tomorrow depending on how he does with feeding tube--tomorrow or Monday potentially. CSW informed Tresa Endo at [facility].  Maryclare Labrador, MSW, [facility] Clinical Social Worker [phone]

## 2013-10-19 NOTE — Progress Notes (Signed)
Patient ID: Francisco Johnston  male  WUJ:[id number]    DOB: 04/05/1934    DOA: 10/11/2013  PCP: [name], MD  Interval history 79 year old African American male who had a hemorrhagic CVA causing right-sided hemiparesis, dysphagia and aphasia in 07/2013, from skilled nursing facility, admitted on 10/11/2013 from nursing home for fevers, lethargy and workup was suggestive of HCAP, he was started on vancomycin and Zosyn upon admissions, cultures are negative to date, on the night of admission he developed atrial fibrillation with RVR, cardiology was consulted, he required digoxin-amiodarone-Cardizem-Lopressor to control his rate, he's finally converted to sinus, he was transiently hypotensive due to sustained tachycardia.  On day 3 he has developed acute renal failure, despite antibiotics,left-sided infiltrate is much worse.   Assessment/Plan:  Failure to thrive/functional decline from Met Encephalopathy HCAP  - Patient reportedly had fever, chills at SNF, leukocytosis, chest x-ray showing LUL Infiltrate - since admission he is on IV vancomycin and Zosyn, cultures are negative, followed by speech therapist, currently changed to Tube feeds per PCCM via NG on 10-14-13.  - PCCM recommeded G-J tube, IR to place- this has been postponed until tomorrow  New onset A fib with RVR  - Cardiology following, on PO amiodarone + Cardizem, stable TSH  - 2-D echo with EF of 55-60%, normal wall motion, not a Candidate for anticoagulation due to recent hemorrhagic CVA. Remains at risk for a new ischemic stroke  Hypotension  Due to combination of pneumonia along with A. fib with RVR. Blood pressure improved after IV fluids.   Acute renal failure  Likely due to ATN caused by hypotension during his episodes of atrial fibrillation with RVR, resolved post IVF.   History of seizures post recent hemorrhagic CVA with chronic right-sided hemiparesis and aphasia and dysphagia  -valproic acid level within therapeutic range   -continue his home dose of valproic acid at 750 mg by mouth twice a day.  -currently changed to Tube feeds. PCCM recommeded G-J tube as ? Recurrent aspiration, IR to place tomorrow   Hypernatremia  Resolved after hydration with initially D5W and now with normal saline.   Severe recurrent Hypokalemia: Likely due to diarrhea - Placed on oral potassium replacement TID and Kcl IV, will recheck it later today  Right arm & Leg swelling  RUE [place] is unremarkable however he has a large DVT in the right leg. Note he has hemiparesis on the right side. He has SCDs bilaterally, at this time, Dr Thedore Mins discussed his case with neurologist Dr. Pearlean Brownie who had taken care of him during his hemorrhagic CVA few months ago, per Dr Pearlean Brownie, he remains very high risk for repeat bleed on full anticoagulation.  - IVC filter placed today by IR  Severe protein calorie malnutrition   tube feeds through NG for now.   DVT Prophylaxis: Heparin subcutaneous  Code Status: Full CODE STATUS  Disposition: return to SNF possibly on Monday    Subjective: No acute events overnight, afebrile- confused and restless  Objective: Weight change: -0.6 kg (-1 lb 5.2 oz)  Intake/Output Summary (Last 24 hours) at 10/19/13 1708 Last data filed at 10/19/13 1600  Gross per 24 hour  Intake 1712.5 ml  Output   1050 ml  Net  662.5 ml   Blood pressure 123/53, pulse 73, temperature 97.7 F (36.5 C), temperature source Oral, resp. rate 26, height 6\' 2"  (1.88 m), weight 72.3 kg (159 lb 6.3 oz), SpO2 96.00%.  Physical Exam: General: Chronic right-sided hemiparesis and aphasia, nonverbal CVS: S1-S2  clear, no murmur rubs or gallops Chest: clear to auscultation bilaterally, no wheezing, rales or rhonchi Abdomen: soft nontender, nondistended, normal bowel sounds  Extremities: no cyanosis, clubbing or edema noted bilaterally  Lab Results: Basic Metabolic Panel:  Recent Labs Lab 10/17/13 0545  10/18/13 0355  10/18/13 1600  10/19/13 0500  NA 142  < > 140  --   --  140  K 3.7  < > 2.7*  < > 3.1* 3.6  CL 109  < > 106  --   --  108  CO2 20  < > 24  --   --  25  GLUCOSE 153*  < > 175*  --   --  166*  BUN 14  < > 16  --   --  14  CREATININE 0.82  < > 0.81  --   --  0.69  CALCIUM 7.3*  < > 6.9*  --   --  7.2*  MG 2.3  --   --   --  2.1  --   PHOS 2.2*  --   --   --   --   --   < > = values in this interval not displayed. Liver Function Tests: No results found for this basename: AST, ALT, ALKPHOS, BILITOT, PROT, ALBUMIN,  in the last 168 hours No results found for this basename: LIPASE, AMYLASE,  in the last 168 hours No results found for this basename: AMMONIA,  in the last 168 hours CBC:  Recent Labs Lab 10/18/13 0355 10/19/13 0500  WBC 15.6* 15.6*  HGB 9.1* 9.2*  HCT 27.4* 27.2*  MCV 87.3 86.6  PLT 127* 90*   Cardiac Enzymes: No results found for this basename: CKTOTAL, CKMB, CKMBINDEX, TROPONINI,  in the last 168 hours BNP: No components found with this basename: POCBNP,  CBG:  Recent Labs Lab 10/18/13 1932 10/18/13 2337 10/19/13 0400 10/19/13 0806 10/19/13 1202  GLUCAP 97 170* 152* 146* 179*     Micro Results: Recent Results (from the past 240 hour(s))  MRSA PCR SCREENING     Status: Abnormal   Collection Time    10/11/13  8:07 PM      Result Value Range Status   MRSA by PCR POSITIVE (*) NEGATIVE Final   Comment:            The GeneXpert MRSA Assay (FDA     approved for NASAL specimens     only), is one component of a     comprehensive MRSA colonization     surveillance program. It is not     intended to diagnose MRSA     infection nor to guide or     monitor treatment for     MRSA infections.     RESULT CALLED TO, READ BACK BY AND VERIFIED WITH:     ELANEY BY TCLEVELAND 10/12/13 AT 12:28AM  CULTURE, BLOOD (ROUTINE X 2)     Status: None   Collection Time    10/11/13  9:50 PM      Result Value Range Status   Specimen Description BLOOD LEFT HAND   Final   Special Requests  BOTTLES DRAWN AEROBIC ONLY 5CC   Final   Culture  Setup Time     Final   Value: 10/12/2013 01:56     Performed at [facility]   Culture     Final   Value: NO GROWTH 5 DAYS     Performed at [facility]   Report  Status 10/18/2013 FINAL   Final  CULTURE, BLOOD (ROUTINE X 2)     Status: None   Collection Time    10/11/13 10:05 PM      Result Value Range Status   Specimen Description BLOOD LEFT HAND   Final   Special Requests BOTTLES DRAWN AEROBIC ONLY 5CC   Final   Culture  Setup Time     Final   Value: 10/12/2013 01:57     Performed at [facility]   Culture     Final   Value: NO GROWTH 5 DAYS     Performed at [facility]   Report Status 10/18/2013 FINAL   Final  CLOSTRIDIUM DIFFICILE BY PCR     Status: None   Collection Time    10/15/13 11:57 PM      Result Value Range Status   C difficile by pcr NEGATIVE  NEGATIVE Final    Studies/Results: Ct Abdomen Wo Contrast  10/18/2013   CLINICAL DATA:  Evaluate anatomy for GJ tube.  EXAM: CT ABDOMEN WITHOUT CONTRAST  TECHNIQUE: Multidetector CT imaging of the abdomen was performed following the standard protocol without IV contrast.  COMPARISON:  CT 10/11/2012  FINDINGS: There are small to moderate bilateral pleural effusions, left larger than right. Compressive atelectasis or infiltrates in the lower lobes. Heart is borderline in size.  NG tube is present in the stomach with the tip in the region of the antrum. The stomach is decompressed distally, partially distended proximally. There is gaseous distention of the colon which overlies much of the stomach. Visualized small bowel is unremarkable.  IVC filter is in place.  Aorta is normal caliber.  Layering gallstones within the gallbladder. Liver, spleen, pancreas, adrenals and kidneys have an unremarkable unenhanced appearance. Trace ascites in the right paracolic gutter and inferior to the right liver.  Degenerative changes in the thoracolumbar spine, most  pronounced in the lower lumbar spine.  IMPRESSION: NG tube present within the stomach with the tip in the antrum.  Gaseous distention of the transverse colon which overlies much of the stomach.  Trace ascites in the right abdomen.  Cholelithiasis.   Electronically Signed   By: Charlett Nose M.D.   On: 10/18/2013 15:29   Dg Chest 2 View  10/11/2013   CLINICAL DATA:  Altered mental status.  EXAM: CHEST  2 VIEW  COMPARISON:  08/08/2013.  FINDINGS: There is left upper lobe airspace disease. There is no pleural effusion or pneumothorax. The heart size is normal. The osseous structures are unremarkable.  IMPRESSION: Left upper lobe pneumonia. Recommend followup radiography in 4-6 weeks, to document complete resolution following adequate medical therapy. If there is not complete resolution, then recommend further evaluation with CT of the chest to exclude underlying pathology.   Electronically Signed   By: Elige Ko   On: 10/11/2013 15:06   Dg Abd 1 View  10/18/2013   CLINICAL DATA:  NG placement  EXAM: ABDOMEN - 1 VIEW  COMPARISON:  10/14/2013  FINDINGS: NG tube tip in the antrum of the stomach. The stomach is mildly distended contains retained food.  Adynamic ileus with dilated right colon. Mild small bowel distention.  IMPRESSION: NG tube tip in the gastric antrum.  Ileus   Electronically Signed   By: Marlan Palau M.D.   On: 10/18/2013 08:26   Ct Head Wo Contrast  10/11/2013   CLINICAL DATA:  Slurred speech. Altered mental status.  EXAM: CT HEAD WITHOUT CONTRAST  TECHNIQUE: Contiguous axial images  were obtained from the base of the skull through the vertex without intravenous contrast.  COMPARISON:  CT 08/08/2013  FINDINGS: Acute hemorrhage in the left parietal white matter on the prior study has resolved. There is a 2 cm hypodensity in this area. No acute hemorrhage.  Moderate to advanced atrophy. Negative for hydrocephalus. Chronic microvascular ischemic change in the white matter. Negative for acute  infarct or mass.  IMPRESSION: Left parietal white matter hypodensity consistent with resolving hematoma compared with the prior study.  Extensive atrophy which has progressed from the prior study. No acute infarct or hemorrhage.   Electronically Signed   By: Marlan Palau M.D.   On: 10/11/2013 16:40   Ir Ivc Filter Plmt / S&i /img Guid/mod Sed  10/18/2013   INDICATION: 79 year old male with history of recent hemorrhagic stroke and now acute lower extremity DVT. With a contraindication to anticoagulation, he requires caval interruption for PE prophylaxis.  EXAM: ULTRASOUND GUIDANCE FOR VASCULAR ACCESS  IVC CATHETERIZATION AND VENOGRAM  IVC FILTER INSERTION  MEDICATIONS: Fentanyl 1 mcg IV; Versed 50 mg IV  ANESTHESIA/SEDATION: Sedation Time  10 minutes  CONTRAST:  50mL OMNIPAQUE IOHEXOL 300 MG/ML  SOLN  COMPARISON:  CT head 08/07/2013; CT abdomen/ pelvis 10/11/2012  FLUOROSCOPY TIME:  30 seconds  PROCEDURE: Informed consent was obtained from the patient following explanation of the procedure, risks, benefits and alternatives. The patient understands, agrees and consents for the procedure. All questions were addressed. A time out was performed prior to the initiation of the procedure.  Maximal barrier sterile technique utilized including caps, mask, sterile gowns, sterile gloves, large sterile drape, hand hygiene, and Betadine prep.  Under sterile condition and local anesthesia, right internal jugular venous access was performed with ultrasound. An ultrasound image was saved and sent to PACS. Over a guidewire, the IVC filter delivery sheath and inner dilator were advanced into the IVC just above the IVC bifurcation. Contrast injection was performed for an IVC venogram.  Through the delivery sheath, a retrievable Denali IVC filter was deployed below the level of the renal veins and above the IVC bifurcation. Limited post deployment venacavagram was performed.  The delivery sheath was removed and hemostasis was  obtained with manual compression. A dressing was placed. The patient tolerated the procedure well without immediate post procedural complication.  COMPLICATIONS: None immediate  FINDINGS: The IVC is patent. No evidence of thrombus, stenosis, or occlusion. No variant venous anatomy. Successful placement of the IVC filter below the level of the renal veins.  IMPRESSION: Successful ultrasound and fluoroscopically guided placement of an infrarenal retrievable IVC filter via right jugular approach.  Given the patient's history of prior hemorrhagic stroke, this filter should be considered a permanent device.  Signed,  Sterling Big, MD  [facility]  [facility]   Electronically Signed   By: Malachy Moan M.D.   On: 10/18/2013 15:28   US Renal  10/14/2013   CLINICAL DATA:  Acute renal failure  EXAM: RENAL/URINARY TRACT ULTRASOUND COMPLETE  COMPARISON:  None.  FINDINGS: Right Kidney  Length: 9.9. Echogenicity within normal limits. No mass or hydronephrosis visualized.  Left Kidney  Length: 10.5. Echogenicity within normal limits. No mass or hydronephrosis visualized.  Bladder  Decompressed by indwelling Foley catheter.  Additional comments: Left pleural effusion.  IMPRESSION: No hydronephrosis.  Bladder decompressed by indwelling Foley catheter.   Electronically Signed   By: Charline Bills M.D.   On: 10/14/2013 09:51   Dg Chest [facility]  10/17/2013   CLINICAL DATA:  Left central line placement  EXAM: PORTABLE CHEST - 1 VIEW  COMPARISON:  10/17/2013  FINDINGS: New left central line insertion, tip at the proximal SVC level. This could be advanced 6 cm to the SVC RA junction. Stable heart size and vascularity. Dense consolidative airspace process throughout the left lung compatible with pneumonia. Stable right lung aeration. No enlarging effusion or pneumothorax.  IMPRESSION: New left IJ central line tip proximal SVC, could be advanced 6 cm to the SVC RA  junction  No significant change in dense left lung airspace process/ pneumonia  No pneumothorax   Electronically Signed   By: Ruel Favors M.D.   On: 10/17/2013 09:07   Dg Chest Port 1 View  10/17/2013   CLINICAL DATA:  Weakness. Shortness of Breath.  EXAM: PORTABLE CHEST - 1 VIEW  COMPARISON:  10/15/2013 and 10/11/2013  FINDINGS: Left lower lobe consolidation may represent atelectasis or infiltrate.  Remainder of the left lung with diffuse airspace disease which may reflect underlying pneumonia.  Right lower lobe airspace disease may represent atelectasis or infiltrate.  Recommend follow-up on to clearance to exclude underlying mass.  Cardiomegaly.  Pulmonary vascular prominence.  Tortuous aorta.  Nasogastric tube tip gastric fundus.  No gross pneumothorax.  IMPRESSION: No significant change since the prior examination in appearance of asymmetric airspace disease (much more notable on the left) as detailed above.   Electronically Signed   By: Bridgett Larsson M.D.   On: 10/17/2013 07:28   Dg Chest Port 1 View  10/15/2013   CLINICAL DATA:  Check tube placement  EXAM: PORTABLE CHEST - 1 VIEW  COMPARISON:  10/14/2013, 10/11/2013.  FINDINGS: There is no endotracheal to present within the field of view. There is a nasogastric tube present with the tip projecting over the stomach. There is gaseous distention of the stomach.  There is mild right basilar airspace disease unchanged in the prior exam. There is diffuse left lung interstitial and alveolar airspace opacities involving the upper and lower lungs. The heart and mediastinum are stable. The osseous structures demonstrate no focal abnormality.  IMPRESSION: Diffuse left lung interstitial and alveolar airspace opacities concerning for multi lobar pneumonia. There is right lower lobe airspace disease which may reflect atelectasis versus pneumonia.  Nasogastric tube with the tip projecting over the stomach.  No endotracheal tube is present within the field of view.    Electronically Signed   By: Elige Ko   On: 10/15/2013 08:27   Dg Chest Port 1 View  10/14/2013   CLINICAL DATA:  Shortness of breath.  EXAM: PORTABLE CHEST - 1 VIEW  COMPARISON:  10/11/2013  FINDINGS: Significant worsening of left lung pneumonia is noted by chest x-ray now involving essentially the entire left lung. No overt edema or significant associated pleural fluid is identified. The heart size and mediastinal contours are stable.  IMPRESSION: Significant worsening of left lung pneumonia.   Electronically Signed   By: Irish Lack M.D.   On: 10/14/2013 08:14   Dg Abd Portable 1v  10/14/2013   CLINICAL DATA:  Ileus  EXAM: PORTABLE ABDOMEN - 1 VIEW  COMPARISON:  Prior film same day  FINDINGS: No definite free abdominal air is noted. NG tube in place. Again noted mild gaseous distension of transverse colon.  IMPRESSION: No definite free abdominal air. Again noted mild gaseous distension transverse colon.   Electronically Signed   By: Natasha Mead M.D.   On: 10/14/2013 17:51   Dg Abd  Portable 1v  10/14/2013   CLINICAL DATA:  NG tube check.  EXAM: PORTABLE ABDOMEN - 1 VIEW  COMPARISON:  Chest x-ray 10/11/2013 and 08/08/2013  FINDINGS: There has been placement of an enteric tube with tip over the stomach in the left upper quadrant. There is a mildly dilated air-filled transverse colon. There is suggestion of airspace opacification over the left mid to upper lung. There are degenerative changes of the spine.  IMPRESSION: Minimally dilated air-filled transverse colon as cannot exclude distal colonic obstruction or ileus. Recommend followup abdominal films as clinically indicated.  Airspace opacification over the left mid to upper lung. Recommend correlation with chest radiograph.  Enteric tube with tip over the stomach in the left upper quadrant.   Electronically Signed   By: Elberta Fortis M.D.   On: 10/14/2013 13:30    Medications: Scheduled Meds: . amiodarone  200 mg Per Tube BID  . antiseptic  oral rinse  15 mL Mouth Rinse q12n4p  . aspirin  81 mg Per Tube Daily  . chlorhexidine  15 mL Mouth Rinse BID  . diltiazem  120 mg Per Tube Q8H  . feeding supplement (PRO-STAT SUGAR FREE 64)  30 mL Per Tube BID  . free water  200 mL Per Tube Q6H  . guaifenesin  600 mg Per Tube BID  . [START ON 10/20/2013] heparin subcutaneous  5,000 Units Subcutaneous Q8H  . linezolid  600 mg Intravenous Q12H  . metoprolol tartrate  25 mg Oral BID  . pantoprazole sodium  40 mg Per Tube Daily  . piperacillin-tazobactam (ZOSYN)  IV  3.375 g Intravenous Q8H  . potassium chloride  40 mEq Oral TID  . sodium chloride  3 mL Intravenous Q12H  . Valproic Acid  750 mg Per Tube BID      LOS: 8 days   [facility] M.D. [facility] 10/19/2013, 5:08 PM Pager: [phone]  If 7PM-7AM, please contact night-coverage [email or web address] Password [email or web address]

## 2013-10-20 ENCOUNTER — Inpatient Hospital Stay (HOSPITAL_COMMUNITY): Payer: Medicare Other

## 2013-10-20 LAB — GLUCOSE, CAPILLARY
Glucose-Capillary: 144 mg/dL — ABNORMAL HIGH (ref 70–99)
Glucose-Capillary: 130 mg/dL — ABNORMAL HIGH (ref 70–99)
Glucose-Capillary: 124 mg/dL — ABNORMAL HIGH (ref 70–99)
Glucose-Capillary: 92 mg/dL (ref 70–99)
Glucose-Capillary: 95 mg/dL (ref 70–99)

## 2013-10-20 LAB — CBC
HCT: 27.5 % — ABNORMAL LOW (ref 39.0–52.0)
Hemoglobin: 9.2 g/dL — ABNORMAL LOW (ref 13.0–17.0)
MCH: 29 pg (ref 26.0–34.0)
RBC: 3.17 MIL/uL — ABNORMAL LOW (ref 4.22–5.81)
WBC: 15.3 10*3/uL — ABNORMAL HIGH (ref 4.0–10.5)

## 2013-10-20 LAB — BASIC METABOLIC PANEL
CO2: 25 mEq/L (ref 19–32)
GFR calc non Af Amer: >90 mL/min (ref >90)
Glucose, Bld: 168 mg/dL — ABNORMAL HIGH (ref 70–99)
Potassium: 4.3 mEq/L (ref 3.5–5.1)
Sodium: 143 mEq/L (ref 135–145)

## 2013-10-20 MED ORDER — FENTANYL CITRATE 0.05 MG/ML IJ SOLN
INTRAMUSCULAR | Status: AC | PRN
  Administered 2013-10-20: 25 ug via INTRAVENOUS

## 2013-10-20 MED ORDER — MIDAZOLAM HCL 2 MG/2ML IJ SOLN
INTRAMUSCULAR | Status: AC
  Filled 2013-10-20: qty 2

## 2013-10-20 MED ORDER — MIDAZOLAM HCL 2 MG/2ML IJ SOLN
INTRAMUSCULAR | Status: AC | PRN
  Administered 2013-10-20: 1 mg via INTRAVENOUS

## 2013-10-20 MED ORDER — IOHEXOL 300 MG/ML  SOLN
50.0000 mL | Freq: Once | INTRAMUSCULAR | Status: AC | PRN
  Administered 2013-10-20: 30 mL via INTRAVENOUS

## 2013-10-20 MED ORDER — FENTANYL CITRATE 0.05 MG/ML IJ SOLN
INTRAMUSCULAR | Status: AC
  Filled 2013-10-20: qty 2

## 2013-10-20 MED ORDER — SODIUM CHLORIDE 0.9 % IV SOLN
INTRAVENOUS | Status: DC
  Administered 2013-10-20: 21:00:00 via INTRAVENOUS

## 2013-10-20 MED ORDER — GLUCAGON HCL (RDNA) 1 MG IJ SOLR
INTRAMUSCULAR | Status: AC
  Administered 2013-10-20: 13:00:00
  Filled 2013-10-20: qty 1

## 2013-10-20 NOTE — Clinical Social Work Note (Signed)
CSW continues to follow case and will facilitate discharge to [facility] when medically appropriate.   Maryclare Labrador, MSW, [facility] Clinical Social Worker [phone]

## 2013-10-20 NOTE — Procedures (Signed)
Successful 73fr pull through g tube No comp Stable  Full use tomorrow

## 2013-10-20 NOTE — Progress Notes (Signed)
Physical Therapy Treatment Patient Details Name: Francisco Johnston MRN: [id number] DOB: 1934/02/21 Today's Date: 10/20/2013 Time: 7829-5621 PT Time Calculation (min): 29 min  PT Assessment / Plan / Recommendation  History of Present Illness Francisco Johnston is a 79 y.o. male with a past medical history of left frontal hemorrhagic CVA, discharged from the stroke service on 08/06/2013 to skilled nursing facility, was transferred to the emergent apartment from his SNF this evening with complaints of overall functional decline/failure to thrive. History was obtained from the patient's son who was present at bedside. Francisco Johnston has become increasingly lethargic, having excessive daytime sleepiness, minimal by mouth intake, having an overall steep decline in the last 4 days. His son notes that he has had chills as well as fever, temperature of 101 at his nursing home   PT Comments   Pt resistant to movement today and not attempting to assist with HEP other than very minimally and not assisting with transfers at all. Will follow for trial to see if pt able to advance with mobility.  Follow Up Recommendations  SNF     Does the patient have the potential to tolerate intense rehabilitation     Barriers to Discharge        Equipment Recommendations       Recommendations for Other Services    Frequency     Progress towards PT Goals Progress towards PT goals: Not progressing toward goals - comment (pt without active participation today)  Plan Current plan remains appropriate    Precautions / Restrictions Precautions Precautions: Fall Precaution Comments: Orange Isolation precautions Restrictions Weight Bearing Restrictions: No   Pertinent Vitals/Pain No pain noted.    Mobility  Bed Mobility Bed Mobility: Rolling Right;Rolling Left Rolling Right: 1: +2 Total assist Rolling Right: Patient Percentage: 0% Rolling Left: 1: +2 Total assist Rolling Left: Patient Percentage: 0% Scooting to  HOB: 1: +2 Total assist Scooting to [facility]: Patient Percentage: 0% Details for Bed Mobility Assistance: pt resistant to transfers today and pushing against railing with rolling. Rolled bilaterally with assist to bend knees rotate pelvis and trunk to change soiled pad. pt not attempting to assist with rolling either direction or scooting up in bed today. did not attempt sitting due to pt so resistant with in bed mobility Transfers Transfers: Not assessed Ambulation/Gait Ambulation/Gait Assistance: Not tested (comment)    Exercises General Exercises - Lower Extremity Ankle Circles/Pumps: PROM;Both;10 reps;Supine Heel Slides: PROM;AAROM;10 reps;Right;Left;Supine (PROm right, AAROm on left) Hip ABduction/ADduction: PROM;Both;10 reps;Supine (pt resisting movement wih LLE)   PT Diagnosis:    PT Problem List:   PT Treatment Interventions:     PT Goals (current goals can now be found in the care plan section)    Visit Information  Last PT Received On: 10/20/13 Assistance Needed: +2 History of Present Illness: Francisco Johnston is a 79 y.o. male with a past medical history of left frontal hemorrhagic CVA, discharged from the stroke service on 08/06/2013 to skilled nursing facility, was transferred to the emergent apartment from his SNF this evening with complaints of overall functional decline/failure to thrive. History was obtained from the patient's son who was present at bedside. Francisco Johnston has become increasingly lethargic, having excessive daytime sleepiness, minimal by mouth intake, having an overall steep decline in the last 4 days. His son notes that he has had chills as well as fever, temperature of 101 at his nursing home    Subjective Data      Cognition  Cognition  Arousal/Alertness: Lethargic Behavior During Therapy: Flat affect Overall Cognitive Status: Difficult to assess Difficult to assess due to: Impaired communication    Balance     End of Session PT - End of  Session Equipment Utilized During Treatment: Oxygen Activity Tolerance: Patient limited by fatigue;Other (comment) (limited by lack of participation) Patient left: in bed;with call bell/phone within reach Nurse Communication: Mobility status   GP     Toney Sang [facility] 10/20/2013, 10:36 AM Delaney Meigs, PT [phone]

## 2013-10-20 NOTE — Progress Notes (Signed)
Back from the IR by bed, with eyes closed but arousable., GT intact. dresssing dry and intact.

## 2013-10-20 NOTE — Progress Notes (Signed)
Patient ID: Francisco Johnston  male  VHQ:[id number]    DOB: 12/18/1933    DOA: 10/11/2013  PCP: [name], MD  Interval history 79 year old male who had a hemorrhagic CVA causing right-sided hemiparesis, dysphagia and aphasia in 07/2013, from skilled nursing facility, admitted on 10/11/2013 from nursing home for fevers, lethargy.  Workup was suggestive of HCAP, he was started on vancomycin and Zosyn.  On the night of admission he developed atrial fibrillation with RVR, cardiology was consulted, he required digoxin-amiodarone-Cardizem-Lopressor to control his rate, he's finally converted to sinus, he was transiently hypotensive due to sustained tachycardia.  On day 3 he has developed acute renal failure, and despite antibiotics a left-sided pulmonary infiltrate is much worse.   Assessment/Plan:  Failure to thrive / functional decline from Met Encephalopathy / HCAP  - Patient reportedly had fever, chills at SNF, leukocytosis, chest x-ray showing LUL Infiltrate - Received 3 days of Vanc and 9 days of Zosyn.  Completed 11/14. - Blood cultures negative - Placed on tube feeds per PCCM via NG on 10-14-13.  - IR placed 20 french G-tube on 11/14  New onset A fib with RVR  - Stable. - Cardiology, Dr. Sharyn Lull, was following - last note 11/10, on PO amiodarone, metoprolol, cardizem. - TSH 1.74 - 2-D echo with EF of 55-60%, normal wall motion, not a Candidate for anticoagulation due to recent hemorrhagic CVA. Remains at risk for a new ischemic stroke  Hypotension  -Resolved.  -Due to combination of pneumonia along with A. fib with RVR. Blood pressure improved after IV fluids.   Acute renal failure  -Resolved. -Likely due to ATN caused by hypotension during his episodes of atrial fibrillation with RVR   History of seizures post recent hemorrhagic CVA with chronic right-sided hemiparesis and aphasia and dysphagia  -valproic acid level within therapeutic range  -continue his home dose of valproic acid  at 750 mg by mouth twice a day.  -currently changed to Tube feeds. PCCM recommeded G-J tube as ? Recurrent aspiration,  -IR placed G-Tube 11/14.  Hypernatremia  -Resolved after hydration with initially D5W and now with normal saline.   Severe recurrent Hypokalemia: Likely due to diarrhea - Placed on oral potassium replacement TID and Kcl IV - resolved  Right arm & Leg swelling  RUE [place] is unremarkable however he has a large DVT in the right leg. Note he has hemiparesis on the right side. He has SCDs bilaterally, at this time, Dr Thedore Mins discussed his case with Neurologist Dr. Pearlean Brownie who had taken care of him during his hemorrhagic CVA few months ago, per Dr Pearlean Brownie, he remains very high risk for repeat bleed on full anticoagulation.  - IVC filter placed by IR 11/12  Severe protein calorie malnutrition  -tube feed to resume now s/p PEG tube insertion 11/14  DVT Prophylaxis: Heparin subcutaneous  Code Status: Full  Disposition: return to SNF [name]) possibly on Monday  Subjective: Patient unable to verbally communicate.  Objective: Weight change: -0.1 kg (-3.5 oz)  Intake/Output Summary (Last 24 hours) at 10/20/13 1612 Last data filed at 10/20/13 1400  Gross per 24 hour  Intake   1080 ml  Output   1800 ml  Net   -720 ml   Blood pressure 102/65, pulse 65, temperature 98.2 F (36.8 C), temperature source Oral, resp. rate 23, height 6\' 2"  (1.88 m), weight 72.2 kg (159 lb 2.8 oz), SpO2 100.00%.  Physical Exam: General:  Cachectic, nonverbal, edentulous, being cleaned up by PT, awake,  moving about. CVS: S1-S2 clear, no murmur rubs or gallops Chest: clear to auscultation bilaterally, no wheezing, rales or rhonchi Abdomen: soft nontender, nondistended, normal bowel sounds  Extremities: no cyanosis.  Right lower leg swollen in comparison to left. Neuro:Chronic right-sided hemiparesis and aphasia,  Lab Results: Basic Metabolic Panel:  Recent Labs Lab 10/17/13 0545   10/18/13 1600 10/19/13 0500 10/20/13 0355  NA 142  < >  --  140 143  K 3.7  < > 3.1* 3.6 4.3  CL 109  < >  --  108 110  CO2 20  < >  --  25 25  GLUCOSE 153*  < >  --  166* 168*  BUN 14  < >  --  14 11  CREATININE 0.82  < >  --  0.69 0.66  CALCIUM 7.3*  < >  --  7.2* 7.3*  MG 2.3  --  2.1  --   --   PHOS 2.2*  --   --   --   --   < > = values in this interval not displayed. CBC:  Recent Labs Lab 10/19/13 0500 10/20/13 0355  WBC 15.6* 15.3*  HGB 9.2* 9.2*  HCT 27.2* 27.5*  MCV 86.6 86.8  PLT 90* 83*   CBG:  Recent Labs Lab 10/19/13 1932 10/19/13 2346 10/20/13 0348 10/20/13 0744 10/20/13 1130  GLUCAP 138* 163* 144* 130* 124*    Micro Results: Recent Results (from the past 240 hour(s))  MRSA PCR SCREENING     Status: Abnormal   Collection Time    10/11/13  8:07 PM      Result Value Range Status   MRSA by PCR POSITIVE (*) NEGATIVE Final   Comment:            The GeneXpert MRSA Assay (FDA     approved for NASAL specimens     only), is one component of a     comprehensive MRSA colonization     surveillance program. It is not     intended to diagnose MRSA     infection nor to guide or     monitor treatment for     MRSA infections.     RESULT CALLED TO, READ BACK BY AND VERIFIED WITH:     ELANEY BY TCLEVELAND 10/12/13 AT 12:28AM  CULTURE, BLOOD (ROUTINE X 2)     Status: None   Collection Time    10/11/13  9:50 PM      Result Value Range Status   Specimen Description BLOOD LEFT HAND   Final   Special Requests BOTTLES DRAWN AEROBIC ONLY 5CC   Final   Culture  Setup Time     Final   Value: 10/12/2013 01:56     Performed at [facility]   Culture     Final   Value: NO GROWTH 5 DAYS     Performed at [facility]   Report Status 10/18/2013 FINAL   Final  CULTURE, BLOOD (ROUTINE X 2)     Status: None   Collection Time    10/11/13 10:05 PM      Result Value Range Status   Specimen Description BLOOD LEFT HAND   Final   Special Requests BOTTLES  DRAWN AEROBIC ONLY 5CC   Final   Culture  Setup Time     Final   Value: 10/12/2013 01:57     Performed at [facility]   Culture     Final  Value: NO GROWTH 5 DAYS     Performed at [facility]   Report Status 10/18/2013 FINAL   Final  CLOSTRIDIUM DIFFICILE BY PCR     Status: None   Collection Time    10/15/13 11:57 PM      Result Value Range Status   C difficile by pcr NEGATIVE  NEGATIVE Final    Medications: Scheduled Meds: . amiodarone  200 mg Per Tube BID  . antiseptic oral rinse  15 mL Mouth Rinse q12n4p  . aspirin  81 mg Per Tube Daily  . chlorhexidine  15 mL Mouth Rinse BID  . diltiazem  120 mg Per Tube Q8H  . feeding supplement (PRO-STAT SUGAR FREE 64)  30 mL Per Tube BID  . fentaNYL      . free water  200 mL Per Tube Q6H  . guaifenesin  600 mg Per Tube BID  . heparin subcutaneous  5,000 Units Subcutaneous Q8H  . metoprolol tartrate  25 mg Oral BID  . midazolam      . pantoprazole sodium  40 mg Per Tube Daily  . sodium chloride  3 mL Intravenous Q12H  . Valproic Acid  750 mg Per Tube BID     LOS: 9 days   York, Marianne L, PA-C [facility] 10/20/2013, 4:12 PM Pager: [phone]  If 7PM-7AM, please contact night-coverage [email or web address] Password [email or web address]  I have personally examined this patient and reviewed the entire database. I have reviewed the above note, made any necessary editorial changes, and agree with its content.  Lonia Blood, MD [facility]

## 2013-10-20 NOTE — Progress Notes (Signed)
Abdominal binder applied, G tube connected to low gomco suction. No drainage noted yet.

## 2013-10-21 LAB — BASIC METABOLIC PANEL
Chloride: 115 mEq/L — ABNORMAL HIGH (ref 96–112)
Creatinine, Ser: 0.77 mg/dL (ref 0.50–1.35)
GFR calc Af Amer: >90 mL/min (ref >90)
GFR calc non Af Amer: 86 mL/min — ABNORMAL LOW (ref >90)
Potassium: 4 mEq/L (ref 3.5–5.1)
Sodium: 148 mEq/L — ABNORMAL HIGH (ref 135–145)

## 2013-10-21 LAB — CBC
HCT: 25.1 % — ABNORMAL LOW (ref 39.0–52.0)
Hemoglobin: 8.5 g/dL — ABNORMAL LOW (ref 13.0–17.0)
MCH: 29.5 pg (ref 26.0–34.0)
MCHC: 33.9 g/dL (ref 30.0–36.0)
MCV: 87.2 fL (ref 78.0–100.0)
Platelets: 61 10*3/uL — ABNORMAL LOW (ref 150–400)
RBC: 2.88 MIL/uL — ABNORMAL LOW (ref 4.22–5.81)
RDW: 15.8 % — ABNORMAL HIGH (ref 11.5–15.5)
WBC: 12.9 10*3/uL — ABNORMAL HIGH (ref 4.0–10.5)

## 2013-10-21 LAB — GLUCOSE, CAPILLARY
Glucose-Capillary: 98 mg/dL (ref 70–99)
Glucose-Capillary: 108 mg/dL — ABNORMAL HIGH (ref 70–99)
Glucose-Capillary: 106 mg/dL — ABNORMAL HIGH (ref 70–99)
Glucose-Capillary: 129 mg/dL — ABNORMAL HIGH (ref 70–99)
Glucose-Capillary: 158 mg/dL — ABNORMAL HIGH (ref 70–99)

## 2013-10-21 MED ORDER — ONDANSETRON HCL 4 MG PO TABS: 4.0000 mg | ORAL_TABLET | Freq: Four times a day (QID) | ORAL | Status: DC | PRN

## 2013-10-21 MED ORDER — METOPROLOL TARTRATE 25 MG PO TABS
25.0000 mg | ORAL_TABLET | Freq: Two times a day (BID) | ORAL | Status: DC
  Administered 2013-10-21 – 2013-10-22 (×3): 25 mg
  Filled 2013-10-21 (×6): qty 1

## 2013-10-21 MED ORDER — SORBITOL 70 % SOLN
30.0000 mL | Freq: Every day | Status: DC | PRN
  Filled 2013-10-21: qty 30

## 2013-10-21 MED ORDER — ACETAMINOPHEN 650 MG RE SUPP: 650.0000 mg | Freq: Four times a day (QID) | RECTAL | Status: DC | PRN

## 2013-10-21 MED ORDER — SODIUM CHLORIDE 0.45 % IV SOLN
INTRAVENOUS | Status: DC
  Administered 2013-10-21: 20 mL via INTRAVENOUS

## 2013-10-21 MED ORDER — ONDANSETRON HCL 4 MG/2ML IJ SOLN: 4.0000 mg | Freq: Four times a day (QID) | INTRAMUSCULAR | Status: DC | PRN

## 2013-10-21 MED ORDER — FREE WATER
200.0000 mL | Freq: Four times a day (QID) | Status: DC
  Administered 2013-10-21 – 2013-10-22 (×5): 200 mL

## 2013-10-21 MED ORDER — JEVITY 1.2 CAL PO LIQD
1000.0000 mL | ORAL | Status: DC
  Administered 2013-10-21 – 2013-10-22 (×2): 1000 mL
  Filled 2013-10-21 (×6): qty 1000

## 2013-10-21 MED ORDER — OXYCODONE HCL 5 MG/5ML PO SOLN: 5.0000 mg | ORAL | Status: DC | PRN

## 2013-10-21 MED ORDER — ACETAMINOPHEN 325 MG PO TABS: 650.0000 mg | ORAL_TABLET | Freq: Four times a day (QID) | ORAL | Status: DC | PRN

## 2013-10-21 MED ORDER — FREE WATER: 200.0000 mL | Freq: Three times a day (TID) | Status: DC

## 2013-10-21 NOTE — Progress Notes (Signed)
[facility] Progress Note [place] TEAM 1 - Stepdown/ICU TEAM   ALY HAUSER JXB:[id number] DOB: 1933/12/19 DOA: 10/11/2013 PCP: [name], MD  Admit HPI / Brief Narrative: 79 year old male who had a hemorrhagic CVA causing right-sided hemiparesis, dysphagia and aphasia in August 2014, admitted from skilled nursing facility on 10/11/2013 for fevers, lethargy. Workup was suggestive of HCAP, he was started on vancomycin and zosyn. On the night of admission he developed atrial fibrillation with RVR, and Cardiology was consulted.  He required digoxin-amiodarone-cardizem-lopressor to control his rate.  He eventually converted to sinus, but was transiently hypotensive due to sustained tachycardia.   On day 3 he has developed acute renal failure, and despite antibiotics a left-sided pulmonary infiltrate appeared to be worsening.  Assessment/Plan:  L Multilobar HCAP  - Patient reportedly had fever and chills at SNF - chest x-ray revealed multilobar L lung infiltrates - received 3 days of Vanc and 9 days of Zosyn - completed 11/14 - Blood cultures negative   Failure to thrive / functional decline from Met Encephalopathy / Severe protein calorie malnutrition  - Placed on tube feeds per PCCM via NG on 10-14-13 - IR placed 20 french G-tube on 11/14  - resume tube feeds today via new PEG   New onset A fib with RVR  - maintaining NSR at this time  - Cardiology, Dr. Sharyn Lull, was following - last note 11/10, on PO amiodarone, metoprolol, cardizem - TSH 1.74  - 2-D echo with EF of 55-60%, normal wall motion - not a Candidate for anticoagulation due to recent hemorrhagic CVA remains at risk for a new embolic stroke   Hypotension  -Resolved - BP improved after IV fluids.  -Due to combination of pneumonia along with A. fib with RVR  Acute renal failure  -Resolved -Likely due to ATN caused by hypotension during his episodes of atrial fibrillation with RVR   History of seizures post  recent hemorrhagic CVA with chronic right-sided hemiparesis, aphasia, and dysphagia  -valproic acid level within therapeutic range  -continue his home dose of valproic acid  -currently tube feed dependent  Hypernatremia  -recurring - initiate free water via PEG and follow  Severe recurrent Hypokalemia - likely due to diarrhea  - placed on potassium replacement TID and Kcl IV - resolved   R Leg DVT -RUE [place] is unremarkable -he has a large DVT in the right leg (he has hemiparesis on the right side)  -Dr Thedore Mins discussed his case with Neurologist Dr. Pearlean Brownie who had taken care of him during his hemorrhagic CVA months ago - per Dr Pearlean Brownie he remains very high risk for repeat bleed on full anticoagulation -IVC filter placed by IR 11/12   Thrombocytopenia -progressive - linezolid was DC 11/14 - will DC heparin products today - no spontaneous bleeding at this time - check HIT panel  MRSA screen + Usual contact precautions   Code Status: FULL Family Communication: no family present at time of exam Disposition Plan: transfer to medical bed - initiate tube feeds + free water - return to SNF [facility]) possibly on Monday  Consultants: Cardiology IR PCCM  Procedures: 11/14 - PEG insertion in IR 11/12 - IVC filter placement  11/11 - L IJ CVL 11/6 TTE >>> EF 55-65%  Antibiotics: Vancomycin 11/5 >>11/7 Zyvox 11/8 >>11/13 Zosyn 11/5 >>> 11/13 Cipro 11/5  DVT prophylaxis: SQ heparin >> SCDs only   HPI/Subjective: Pt is non-communicative.  He does not appear to be in any distress.  Objective: Blood  pressure 117/57, pulse 66, temperature 97.6 F (36.4 C), temperature source Oral, resp. rate 25, height 6\' 2"  (1.88 m), weight 73.3 kg (161 lb 9.6 oz), SpO2 98.00%.  Intake/Output Summary (Last 24 hours) at 10/21/13 1007 Last data filed at 10/21/13 0752  Gross per 24 hour  Intake  732.5 ml  Output   1950 ml  Net -1217.5 ml   Exam: General: No acute respiratory distress Lungs:  Clear to auscultation bilaterally without wheezes or crackles - poor air movement B bases  Cardiovascular: Regular rate and rhythm without murmur gallop or rub  Abdomen: Nontender, nondistended, soft, bowel sounds positive, no rebound, no ascites, no appreciable mass - PEG insertion site clean and dry - abdom binder in place  Extremities: No significant cyanosis, clubbing:  2+ unilateral edema R lower extremity  Data Reviewed: Basic Metabolic Panel:  Recent Labs Lab 10/15/13 0436 10/16/13 0425 10/17/13 0545 10/17/13 1817 10/18/13 0355 10/18/13 0600 10/18/13 1600 10/19/13 0500 10/20/13 0355 10/21/13 0421  NA 143 138 142 141 140  --   --  140 143 148*  K 3.2* 5.0 3.7 2.4* 2.7* 2.6* 3.1* 3.6 4.3 4.0  CL 108 109 109 108 106  --   --  108 110 115*  CO2 20 18* 20 23 24   --   --  25 25 28   GLUCOSE 101* 126* 153* 173* 175*  --   --  166* 168* 109*  BUN 10 11 14 16 16   --   --  14 11 13   CREATININE 0.76 0.63 0.82 0.86 0.81  --   --  0.69 0.66 0.77  CALCIUM 7.5* 7.0* 7.3* 7.1* 6.9*  --   --  7.2* 7.3* 7.5*  MG 2.2 2.2 2.3  --   --   --  2.1  --   --   --   PHOS 2.7 2.7 2.2*  --   --   --   --   --   --   --    Liver Function Tests: No results found for this basename: AST, ALT, ALKPHOS, BILITOT, PROT, ALBUMIN,  in the last 168 hours  CBC:  Recent Labs Lab 10/17/13 0545 10/18/13 0355 10/19/13 0500 10/20/13 0355 10/21/13 0421  WBC 15.4* 15.6* 15.6* 15.3* 12.9*  HGB 9.9* 9.1* 9.2* 9.2* 8.5*  HCT 29.6* 27.4* 27.2* 27.5* 25.1*  MCV 86.3 87.3 86.6 86.8 87.2  PLT 122* 127* 90* 83* 61*    Recent Results (from the past 240 hour(s))  MRSA PCR SCREENING     Status: Abnormal   Collection Time    10/11/13  8:07 PM      Result Value Range Status   MRSA by PCR POSITIVE (*) NEGATIVE Final   Comment:            The GeneXpert MRSA Assay (FDA     approved for NASAL specimens     only), is one component of a     comprehensive MRSA colonization     surveillance program. It is not      intended to diagnose MRSA     infection nor to guide or     monitor treatment for     MRSA infections.     RESULT CALLED TO, READ BACK BY AND VERIFIED WITH:     ELANEY BY [facility] 10/12/13 AT 12:28AM  CULTURE, BLOOD (ROUTINE X 2)     Status: None   Collection Time    10/11/13  9:50 PM  Result Value Range Status   Specimen Description BLOOD LEFT HAND   Final   Special Requests BOTTLES DRAWN AEROBIC ONLY 5CC   Final   Culture  Setup Time     Final   Value: 10/12/2013 01:56     Performed at [facility]   Culture     Final   Value: NO GROWTH 5 DAYS     Performed at [facility]   Report Status 10/18/2013 FINAL   Final  CULTURE, BLOOD (ROUTINE X 2)     Status: None   Collection Time    10/11/13 10:05 PM      Result Value Range Status   Specimen Description BLOOD LEFT HAND   Final   Special Requests BOTTLES DRAWN AEROBIC ONLY 5CC   Final   Culture  Setup Time     Final   Value: 10/12/2013 01:57     Performed at [facility]   Culture     Final   Value: NO GROWTH 5 DAYS     Performed at [facility]   Report Status 10/18/2013 FINAL   Final  CLOSTRIDIUM DIFFICILE BY PCR     Status: None   Collection Time    10/15/13 11:57 PM      Result Value Range Status   C difficile by pcr NEGATIVE  NEGATIVE Final     Studies:  Recent x-ray studies have been reviewed in detail by the Attending Physician  Scheduled Meds:  Scheduled Meds: . amiodarone  200 mg Per Tube BID  . antiseptic oral rinse  15 mL Mouth Rinse q12n4p  . aspirin  81 mg Per Tube Daily  . chlorhexidine  15 mL Mouth Rinse BID  . diltiazem  120 mg Per Tube Q8H  . feeding supplement (PRO-STAT SUGAR FREE 64)  30 mL Per Tube BID  . free water  200 mL Per Tube Q6H  . guaifenesin  600 mg Per Tube BID  . heparin subcutaneous  5,000 Units Subcutaneous Q8H  . metoprolol tartrate  25 mg Oral BID  . pantoprazole sodium  40 mg Per Tube Daily  . sodium chloride  3 mL Intravenous Q12H  .  Valproic Acid  750 mg Per Tube BID    Time spent on care of this patient: 35 mins   [facility] T  [facility] Office  [phone] Pager - Text Page per Loretha Stapler as per below:  On-Call/Text Page:      [email or web address]      password [email or web address]  If 7PM-7AM, please contact night-coverage [email or web address] Password [email or web address] 10/21/2013, 10:07 AM   LOS: 10 days

## 2013-10-21 NOTE — Progress Notes (Signed)
Subjective: Patient with history of hemorrhagic CVA and dysphagia with high aspiration risk s/p gastric tube placement 10/20/13. Patient is unable to verbally communicate at this time.   Objective: Physical Exam: BP 117/57  Pulse 66  Temp(Src) 97.6 F (36.4 C) (Oral)  Resp 25  Ht 6\' 2"  (1.88 m)  Wt 161 lb 9.6 oz (73.3 kg)  BMI 20.74 kg/m2  SpO2 98%  Abd: Dressing C/D/I, no signs of bleeding or leakage, (+) BS  Labs: CBC  Recent Labs  10/20/13 0355 10/21/13 0421  WBC 15.3* 12.9*  HGB 9.2* 8.5*  HCT 27.5* 25.1*  PLT 83* 61*   BMET  Recent Labs  10/20/13 0355 10/21/13 0421  NA 143 148*  K 4.3 4.0  CL 110 115*  CO2 25 28  GLUCOSE 168* 109*  BUN 11 13  CREATININE 0.66 0.77  CALCIUM 7.3* 7.5*   LFT No results found for this basename: PROT, ALBUMIN, AST, ALT, ALKPHOS, BILITOT, BILIDIR, IBILI, LIPASE,  in the last 72 hours PT/INR No results found for this basename: LABPROT, INR,  in the last 72 hours   Studies/Results: Ir Gastrostomy Tube Mod Sed  10/20/2013   CLINICAL DATA:  RECURRENT ASPIRATION, DYSPHAGIA  EXAM: FLUOROSCOPIC PULL THROUGH 20 FRENCH GASTROSTOMY  Date:  11/14/201411/14/2014 12:37 PM  Radiologist:  Judie Petit. Ruel Favors, MD  Guidance:  FLUOROSCOPIC  MEDICATIONS AND MEDICAL HISTORY: 1 g Ancefadministered within 1 hour of the procedure,1 mg Versed, 25 mcg fentanyl  ANESTHESIA/SEDATION: 15 min  CONTRAST:  20 CC OMNIPAQUE 300  FLUOROSCOPY TIME:  6 min 12 seconds  PROCEDURE: Informed consent was obtained from the patient following explanation of the procedure, risks, benefits and alternatives. The patient understands, agrees and consents for the procedure. All questions were addressed. A time out was performed.  Maximal barrier sterile technique utilized including caps, mask, sterile gowns, sterile gloves, large sterile drape, hand hygiene, and betadine prep.  The left upper quadrant was sterilely prepped and draped. An oral gastric catheter was inserted into the  stomach under fluoroscopy. The existing nasogastric feeding tube was removed. Air was injected into the stomach for insufflation and visualization under fluoroscopy. The air distended stomach was confirmed beneath the anterior abdominal wall in the frontal and lateral projections. Under sterile conditions and local anesthesia, a 17 gauge trocar needle was utilized to access the stomach percutaneously beneath the left subcostal margin. Needle position was confirmed within the stomach under biplane fluoroscopy. Contrast injection confirmed position also. A single T tack was deployed for gastropexy. Over an Amplatz guide wire, a 9-French sheath was inserted into the stomach. A snare device was utilized to capture the oral gastric catheter. The snare device was pulled retrograde from the stomach up the esophagus and out the oropharynx. The 20-French pull-through gastrostomy was connected to the snare device and pulled antegrade through the oropharynx down the esophagus into the stomach and then through the percutaneous tract external to the patient. The gastrostomy was assembled externally. Contrast injection confirms position in the stomach. Images were obtained for documentation. The patient tolerated procedure well. No immediate complication.  COMPLICATIONS: NO IMMEDIATE  IMPRESSION: Fluoroscopic insertion of a 20-French "pull-through" gastrostomy.   Electronically Signed   By: Ruel Favors M.D.   On: 10/20/2013 13:53   Dg Abd Portable 1v  10/20/2013   CLINICAL DATA:  Check barium placement for were gastrostomy procedure  EXAM: PORTABLE ABDOMEN - 1 VIEW  COMPARISON:  10/19/2013  FINDINGS: Persisting contrast is noted within the right colon. Minimal contrast  is noted within the transverse colon. No obstructive changes are noted. A IVC filter is again seen. Diffuse infiltrate is noted within the left lung.  IMPRESSION: Minimal contrast within the transverse colon.   Electronically Signed   By: Alcide Clever M.D.    On: 10/20/2013 07:25   Dg Abd Portable 1v  10/19/2013   CLINICAL DATA:  Evaluate barium prior to potential gastrostomy tube placement  EXAM: PORTABLE ABDOMEN - 1 VIEW  COMPARISON:  10/19/2013; CT abdomen and pelvis -10/18/2013  FINDINGS: Enteric contrast is not seen primarily within the cecum and terminal ileum. There has been reduction in the amount of enteric contrast within the stomach with minimal amount remaining within the gastric fundus.  Nondiagnostic evaluation of the bowel gas pattern secondary to positioning and patient motion artifact. Enteric tube projects over the gastric fundus, tip obscured due to patient motion.  Limited visualization of the lower thorax suggest grossly unchanged left mid and lower lung heterogeneous opacities. Query trace left-sided effusion.  Grossly unchanged bones. IVC filter overlies the right mid hemiabdomen.  IMPRESSION: Enteric contrast now seen primarily within the terminal ileum and cecum. Minimal amount of contrast remaining within the gastric fundus.   Electronically Signed   By: Simonne Come M.D.   On: 10/19/2013 12:40    Assessment/Plan: Hemorrhagic CVA. Dysphagia with high aspiration risk S/p successful 35F pull through gastric tube placement. Afebrile, (+) BS, may use gastric tube now.    LOS: 10 days    Berneta Levins PA-C 10/21/2013 10:09 AM

## 2013-10-22 LAB — CBC
Hemoglobin: 9.6 g/dL — ABNORMAL LOW (ref 13.0–17.0)
MCH: 29.4 pg (ref 26.0–34.0)
MCHC: 33.4 g/dL (ref 30.0–36.0)
Platelets: 71 10*3/uL — ABNORMAL LOW (ref 150–400)

## 2013-10-22 LAB — BASIC METABOLIC PANEL
BUN: 14 mg/dL (ref 6–23)
CO2: 25 mEq/L (ref 19–32)
Calcium: 7.4 mg/dL — ABNORMAL LOW (ref 8.4–10.5)
GFR calc non Af Amer: 83 mL/min — ABNORMAL LOW (ref >90)
Glucose, Bld: 190 mg/dL — ABNORMAL HIGH (ref 70–99)
Potassium: 3.4 mEq/L — ABNORMAL LOW (ref 3.5–5.1)

## 2013-10-22 LAB — GLUCOSE, CAPILLARY
Glucose-Capillary: 192 mg/dL — ABNORMAL HIGH (ref 70–99)
Glucose-Capillary: 207 mg/dL — ABNORMAL HIGH (ref 70–99)

## 2013-10-22 MED ORDER — MUPIROCIN 2 % EX OINT: 1.0000 "application " | TOPICAL_OINTMENT | Freq: Two times a day (BID) | CUTANEOUS | Status: DC

## 2013-10-22 MED ORDER — FREE WATER
250.0000 mL | Freq: Four times a day (QID) | Status: DC
  Administered 2013-10-22 – 2013-10-23 (×4): 250 mL

## 2013-10-22 MED ORDER — CHLORHEXIDINE GLUCONATE CLOTH 2 % EX PADS: 6.0000 | MEDICATED_PAD | Freq: Every day | CUTANEOUS | Status: DC

## 2013-10-22 MED ORDER — POTASSIUM CHLORIDE 20 MEQ/15ML (10%) PO LIQD
20.0000 meq | Freq: Three times a day (TID) | ORAL | Status: DC
  Administered 2013-10-22 – 2013-10-23 (×3): 20 meq
  Filled 2013-10-22 (×5): qty 15

## 2013-10-22 NOTE — Progress Notes (Signed)
NURSING PROGRESS NOTE  Francisco Johnston [id number] Admission Data: 10/22/2013 Pt was on the unit prior to this RN's arrival Attending Provider: Lonia Blood, MD HYQ:MVHQIO,N S, MD Code Status: Full  Francisco Johnston is a 79 y.o. male patient admitted from ED:  -No acute distress noted.  -No complaints of shortness of breath.  -No complaints of chest pain.    Blood pressure 125/64, pulse 86, temperature 98.6 F (37 C), temperature source Oral, resp. rate 20, height 6\' 2"  (1.88 m), weight 73.3 kg (161 lb 9.6 oz), SpO2 98.00%.   IV Fluids: Central line in place on left neck, no redness, patent, and infusing with NS at [facility] Allergies:  Review of patient's allergies indicates no known allergies.  Past Medical History:   has a past medical history of Heart disease; Hypertension; Iron deficiency anemia (06/12/2011); CAD (coronary artery disease); Noncompliance; ED (erectile dysfunction); IFG (impaired fasting glucose); Lacunar stroke; Myocardial infarction (1980's X2); Prostate cancer (1990's); Adenocarcinoma of colon (06/12/2011); Pneumonia; Stroke (08/06/2013); Arthritis; and Chronic kidney disease.  Past Surgical History:   has past surgical history that includes Colonoscopy; Colectomy (~ 2011); Cataract extraction (Right); and Ankle fracture surgery (Left, 1990's).  Social History:   reports that he has never smoked. He quit smokeless tobacco use about 2 months ago. His smokeless tobacco use included Snuff. He reports that he drinks alcohol. He reports that he does not use illicit drugs.  Skin: NSI  Patient/Family oriented to room. Information packet given to patient/family. Admission inpatient armband information verified with patient/family to include name and date of birth and placed on patient arm. Side rails up x 2, fall assessment and education completed with patient/family. Patient/family able to verbalize understanding of risk associated with falls and verbalized understanding to call  for assistance before getting out of bed. Call light within reach. Patient/family able to voice and demonstrate understanding of unit orientation instructions.

## 2013-10-22 NOTE — Progress Notes (Signed)
[facility] Progress Note [place] TEAM 1 - Stepdown/ICU TEAM   Francisco Johnston ZOX:[id number] DOB: 1934/06/13 DOA: 10/11/2013 PCP: Francisco Asa, MD  Admit HPI / Brief Narrative: 79 year old male who had a hemorrhagic CVA causing right-sided hemiparesis, dysphagia and aphasia in August 2014, admitted from skilled nursing facility on 10/11/2013 for fevers, lethargy. Workup was suggestive of HCAP, he was started on vancomycin and zosyn. On the night of admission he developed atrial fibrillation with RVR, and Cardiology was consulted.  He required digoxin-amiodarone-cardizem-lopressor to control his rate.  He eventually converted to sinus, but was transiently hypotensive due to sustained tachycardia.   On day 3 he has developed acute renal failure, and despite antibiotics a left-sided pulmonary infiltrate appeared to be worsening.  Assessment/Plan:  L Multilobar HCAP  - Patient reportedly had fever and chills at SNF - chest x-ray revealed multilobar L lung infiltrates - received 3 days of Vanc and 9 days of Zosyn - completed 11/14 - Blood cultures negative   Failure to thrive / functional decline from Met Encephalopathy / Severe protein calorie malnutrition  - Placed on tube feeds per PCCM via NG on 10-14-13 - IR placed 20 french G-tube on 11/14  - resume tube feeds today via new PEG   New onset A fib with RVR  - maintaining NSR at this time  - Cardiology, Dr. Sharyn Johnston, was following - last note 11/10, on PO amiodarone, metoprolol, cardizem - TSH 1.74  - 2-D echo with EF of 55-60%, normal wall motion - not a Candidate for anticoagulation due to recent hemorrhagic CVA remains at risk for a new embolic stroke   Hypotension  -Resolved - BP improved after IV fluids.  -Due to combination of pneumonia along with A. fib with RVR  Acute renal failure  -Resolved -Likely due to ATN caused by hypotension during his episodes of atrial fibrillation with RVR   History of seizures post  recent hemorrhagic CVA with chronic right-sided hemiparesis, aphasia, and dysphagia  -valproic acid level within therapeutic range  -continue his home dose of valproic acid  -currently tube feed dependent  Hypernatremia  -recurring - increase free water via PEG and follow  Severe recurrent Hypokalemia - likely due to diarrhea  - recurring - increase tx and follow   R Leg DVT -RUE [place] is unremarkable -he has a large DVT in the right leg (he has hemiparesis on the right side)  -Dr Francisco Johnston discussed his case with Neurologist Dr. Pearlean Johnston who had taken care of him during his hemorrhagic CVA months ago - per Dr Francisco Johnston he remains very high risk for repeat bleed on full anticoagulation -IVC filter placed by IR 11/12   Thrombocytopenia -progressive - linezolid was DC 11/14 - stopped heparin products 1/15 - no spontaneous bleeding at this time - HIT panel pending   MRSA screen + Usual contact precautions   Code Status: FULL Family Communication: no family present at time of exam Disposition Plan: transfer to medical bed - initiate tube feeds + free water - return to SNF Francisco Johnston) possibly on Monday  Consultants: Cardiology IR PCCM  Procedures: 11/14 - PEG insertion in IR 11/12 - IVC filter placement  11/11 - L IJ CVL 11/6 TTE >>> EF 55-65%  Antibiotics: Vancomycin 11/5 >>11/7 Zyvox 11/8 >>11/13 Zosyn 11/5 >>> 11/13 Cipro 11/5  DVT prophylaxis: SQ heparin >> SCDs only   HPI/Subjective: Pt is non-communicative.  He does not appear to be in any acute distress.  Objective: Blood pressure 107/58,  pulse 72, temperature 97.3 F (36.3 C), temperature source Tympanic, resp. rate 20, height 6\' 2"  (1.88 m), weight 72.984 kg (160 lb 14.4 oz), SpO2 96.00%.  Intake/Output Summary (Last 24 hours) at 10/22/13 1531 Last data filed at 10/22/13 1409  Gross per 24 hour  Intake   4647 ml  Output    325 ml  Net   4322 ml   Exam: General: No acute respiratory distress Lungs: Clear to  auscultation bilaterally without wheezes or crackles - poor air movement B bases  Cardiovascular: Regular rate and rhythm without murmur gallop or rub  Abdomen: Nontender, nondistended, soft, bowel sounds positive, no rebound, no ascites, no appreciable mass - PEG insertion site clean and dry  Extremities: No significant cyanosis, clubbing:  2+ unilateral edema R lower extremity  Data Reviewed: Basic Metabolic Panel:  Recent Labs Lab 10/16/13 0425 10/17/13 0545  10/18/13 0355  10/18/13 1600 10/19/13 0500 10/20/13 0355 10/21/13 0421 10/22/13 0650  NA 138 142  < > 140  --   --  140 143 148* 149*  K 5.0 3.7  < > 2.7*  < > 3.1* 3.6 4.3 4.0 3.4*  CL 109 109  < > 106  --   --  108 110 115* 115*  CO2 18* 20  < > 24  --   --  25 25 28 25   GLUCOSE 126* 153*  < > 175*  --   --  166* 168* 109* 190*  BUN 11 14  < > 16  --   --  14 11 13 14   CREATININE 0.63 0.82  < > 0.81  --   --  0.69 0.66 0.77 0.84  CALCIUM 7.0* 7.3*  < > 6.9*  --   --  7.2* 7.3* 7.5* 7.4*  MG 2.2 2.3  --   --   --  2.1  --   --   --   --   PHOS 2.7 2.2*  --   --   --   --   --   --   --   --   < > = values in this interval not displayed.  Liver Function Tests: No results found for this basename: AST, ALT, ALKPHOS, BILITOT, PROT, ALBUMIN,  in the last 168 hours  CBC:  Recent Labs Lab 10/18/13 0355 10/19/13 0500 10/20/13 0355 10/21/13 0421 10/22/13 0650  WBC 15.6* 15.6* 15.3* 12.9* 12.0*  HGB 9.1* 9.2* 9.2* 8.5* 9.6*  HCT 27.4* 27.2* 27.5* 25.1* 28.7*  MCV 87.3 86.6 86.8 87.2 87.8  PLT 127* 90* 83* 61* 71*    Recent Results (from the past 240 hour(s))  CLOSTRIDIUM DIFFICILE BY PCR     Status: None   Collection Time    10/15/13 11:57 PM      Result Value Range Status   C difficile by pcr NEGATIVE  NEGATIVE Final     Studies:  Recent x-ray studies have been reviewed in detail by the Attending Physician  Scheduled Meds:  Scheduled Meds: . amiodarone  200 mg Per Tube BID  . antiseptic oral rinse  15  mL Mouth Rinse q12n4p  . aspirin  81 mg Per Tube Daily  . chlorhexidine  15 mL Mouth Rinse BID  . diltiazem  120 mg Per Tube Q8H  . feeding supplement (PRO-STAT SUGAR FREE 64)  30 mL Per Tube BID  . free water  200 mL Per Tube Q6H  . guaifenesin  600 mg Per Tube BID  . metoprolol  tartrate  25 mg Per Tube BID  . pantoprazole sodium  40 mg Per Tube Daily  . sodium chloride  3 mL Intravenous Q12H  . Valproic Acid  750 mg Per Tube BID    Time spent on care of this patient: 35 [facility]   [facility]  [facility] Office  [phone] Pager - Text Page per Loretha Stapler as per below:  On-Call/Text Page:      [email or web address]      password [email or web address]  If 7PM-7AM, please contact night-coverage [email or web address] Password [email or web address] 10/22/2013, 3:31 PM   LOS: 11 days

## 2013-10-23 LAB — GLUCOSE, CAPILLARY
Glucose-Capillary: 206 mg/dL — ABNORMAL HIGH (ref 70–99)
Glucose-Capillary: 230 mg/dL — ABNORMAL HIGH (ref 70–99)
Glucose-Capillary: 242 mg/dL — ABNORMAL HIGH (ref 70–99)
Glucose-Capillary: 259 mg/dL — ABNORMAL HIGH (ref 70–99)

## 2013-10-23 LAB — BASIC METABOLIC PANEL
CO2: 26 mEq/L (ref 19–32)
Calcium: 7.2 mg/dL — ABNORMAL LOW (ref 8.4–10.5)
Creatinine, Ser: 0.86 mg/dL (ref 0.50–1.35)
GFR calc non Af Amer: 82 mL/min — ABNORMAL LOW (ref >90)
Glucose, Bld: 258 mg/dL — ABNORMAL HIGH (ref 70–99)

## 2013-10-23 LAB — CBC
HCT: 26.8 % — ABNORMAL LOW (ref 39.0–52.0)
MCH: 28.6 pg (ref 26.0–34.0)
MCHC: 32.8 g/dL (ref 30.0–36.0)
MCV: 87 fL (ref 78.0–100.0)
Platelets: 44 10*3/uL — ABNORMAL LOW (ref 150–400)
RDW: 15.7 % — ABNORMAL HIGH (ref 11.5–15.5)
WBC: 11.9 10*3/uL — ABNORMAL HIGH (ref 4.0–10.5)

## 2013-10-23 MED ORDER — ACETAMINOPHEN 650 MG RE SUPP: 650.0000 mg | Freq: Four times a day (QID) | RECTAL | Status: AC | PRN

## 2013-10-23 MED ORDER — SORBITOL 70 % SOLN: 30.0000 mL | Freq: Every day | Status: AC | PRN

## 2013-10-23 MED ORDER — JEVITY 1.2 CAL PO LIQD: 1000.0000 mL | ORAL | Status: AC

## 2013-10-23 MED ORDER — VALPROIC ACID 250 MG/5ML PO SYRP: 750.0000 mg | ORAL_SOLUTION | Freq: Two times a day (BID) | ORAL | Status: AC

## 2013-10-23 MED ORDER — ASPIRIN 81 MG PO CHEW: 81.0000 mg | CHEWABLE_TABLET | Freq: Every day | ORAL | Status: AC

## 2013-10-23 MED ORDER — DILTIAZEM 12 MG/ML ORAL SUSPENSION: 120.0000 mg | Freq: Three times a day (TID) | ORAL | Status: AC

## 2013-10-23 MED ORDER — AMIODARONE HCL 200 MG PO TABS: 200.0000 mg | ORAL_TABLET | Freq: Two times a day (BID) | ORAL | Status: AC

## 2013-10-23 MED ORDER — POTASSIUM CHLORIDE 20 MEQ/15ML (10%) PO LIQD: 20.0000 meq | Freq: Three times a day (TID) | ORAL | Status: AC

## 2013-10-23 MED ORDER — FREE WATER: 250.0000 mL | Freq: Four times a day (QID) | Status: DC

## 2013-10-23 MED ORDER — PRO-STAT SUGAR FREE PO LIQD: 30.0000 mL | Freq: Two times a day (BID) | ORAL | Status: AC

## 2013-10-23 MED ORDER — GUAIFENESIN 100 MG/5ML PO SYRP: 600.0000 mg | ORAL_SOLUTION | Freq: Four times a day (QID) | ORAL | Status: AC | PRN

## 2013-10-23 MED ORDER — BIOTENE DRY MOUTH MT LIQD: 15.0000 mL | Freq: Two times a day (BID) | OROMUCOSAL | Status: AC

## 2013-10-23 MED ORDER — METOPROLOL TARTRATE 25 MG/10 ML ORAL SUSPENSION
25.0000 mg | Freq: Two times a day (BID) | ORAL | Status: DC
  Administered 2013-10-23: 25 mg
  Filled 2013-10-23 (×2): qty 10

## 2013-10-23 MED ORDER — CHLORHEXIDINE GLUCONATE 0.12 % MT SOLN: 15.0000 mL | Freq: Two times a day (BID) | OROMUCOSAL | Status: AC

## 2013-10-23 MED ORDER — GUAIFENESIN 100 MG/5ML PO SYRP
600.0000 mg | ORAL_SOLUTION | Freq: Four times a day (QID) | ORAL | Status: DC | PRN
  Filled 2013-10-23: qty 30

## 2013-10-23 MED ORDER — OXYCODONE HCL 5 MG/5ML PO SOLN: 5.0000 mg | ORAL | Status: AC | PRN

## 2013-10-23 MED ORDER — ONDANSETRON HCL 4 MG PO TABS: 4.0000 mg | ORAL_TABLET | Freq: Four times a day (QID) | ORAL | Status: AC | PRN

## 2013-10-23 MED ORDER — PANTOPRAZOLE SODIUM 40 MG PO PACK: 40.0000 mg | PACK | Freq: Every day | ORAL | Status: AC

## 2013-10-23 MED ORDER — METOPROLOL TARTRATE 25 MG/10 ML ORAL SUSPENSION: 25.0000 mg | Freq: Two times a day (BID) | ORAL | Status: AC

## 2013-10-23 MED ORDER — ACETAMINOPHEN 325 MG PO TABS: 650.0000 mg | ORAL_TABLET | Freq: Four times a day (QID) | ORAL | Status: AC | PRN

## 2013-10-23 NOTE — Discharge Summary (Signed)
DISCHARGE SUMMARY  Francisco Johnston  MR#: [id number]  DOB:[birth year]  Date of Admission: 10/11/2013 Date of Discharge: 10/23/2013  Attending Physician:Rhayne Chatwin T  Patient's NWG:NFAOZH,Y S, MD  Consults: Cardiology - Dr. Sharyn Lull  Disposition: D/C to SNF   Follow-up Appts:     Follow-up Information   Follow up with [name], MD. Schedule an appointment as soon as possible for a visit in 5 days.   Specialty:  Family Medicine   Contact information:   [address] [place] [address] [phone]       Tests Needing Follow-up: -) PEG insertion site should be examined routinely to r/o infection, leak, or bleeding complications -) Plt coutn should be assessed within the next 5-7 days -) Na level should be assessed within the next 5-7 days, with determination made on adjusting free water via PEG as needed -) K+ level should be assessed within the next 5 days  Discharge Diagnoses: L Multilobar HCAP  Failure to thrive / functional decline from Met Encephalopathy / Severe protein calorie malnutrition   New onset A fib with RVR  Hypotension  Acute renal failure  History of seizures post recent hemorrhagic CVA with chronic right-sided hemiparesis, aphasia, and dysphagia  Hypernatremia  Severe recurrent Hypokalemia  R Leg DVT  Thrombocytopenia  MRSA screen +   Initial presentation: 79 year old male who had a hemorrhagic CVA causing right-sided hemiparesis, dysphagia and aphasia in August 2014, admitted from skilled nursing facility on 10/11/2013 for fevers, lethargy. Workup was suggestive of HCAP, he was started on vancomycin and zosyn. On the night of admission he developed atrial fibrillation with RVR, and Cardiology was consulted. He required digoxin-amiodarone-cardizem-lopressor to control his rate. He eventually converted to sinus, but was transiently hypotensive due to sustained tachycardia.  On day 3 he has developed acute renal failure, and despite  antibiotics a left-sided pulmonary infiltrate appeared to be worsening.   Hospital Course:  L Multilobar HCAP  - chest x-ray revealed multilobar L lung infiltrates  - received 3 days of Vanc and 9 days of Zosyn - completed 11/14  - Blood cultures negative  - afebrile and clinically stable at time of d/c   Failure to thrive / functional decline from Met Encephalopathy / Severe protein calorie malnutrition  - Placed on tube feeds per PCCM via NG on 10-14-13  - IR placed 20 french G-tube on 11/14  - resumed tube feeds via new PEG and tolerating at goal rate at time of d/c to SNF  New onset A fib with RVR  - maintaining NSR at time of d/c to SNF  - Cardiology, Dr. Sharyn Lull, was following - last note 11/10, on PO amiodarone, metoprolol, cardizem  - TSH 1.74  - 2-D echo with EF of 55-60%, normal wall motion  - not a Candidate for anticoagulation due to recent hemorrhagic CVA remains at risk for a new embolic stroke   Hypotension  -Resolved - BP improved after IV fluids.  -Due to combination of pneumonia along with A. fib with RVR   Acute renal failure  -Resolved  -Likely due to ATN caused by hypotension during his episodes of atrial fibrillation with RVR   History of seizures post recent hemorrhagic CVA with chronic right-sided hemiparesis, aphasia, and dysphagia  -valproic acid level within therapeutic range  -continue his home dose of valproic acid  -currently tube feed dependent   Hypernatremia  -improving with  free water via PEG - follow at SNF and determine ultimate volume of free water  via PEG that will be required for maintenance   Severe recurrent Hypokalemia  - likely due to diarrhea  - improved at time of d/c - will need to monitor in SNF   R Leg DVT  -RUE US unremarkable  -he has a large DVT in the right leg (he has hemiparesis on the right side)  -Dr Thedore Mins discussed his case with Neurologist Dr. Pearlean Brownie who had taken care of him during his hemorrhagic CVA months ago -  per Dr Pearlean Brownie he remains very high risk for repeat bleed on full anticoagulation  -IVC filter placed by IR 11/12   Thrombocytopenia  -progressive - linezolid was DC 11/14 - stopped heparin products 1/15 - no spontaneous bleeding at this time - HIT panel pending at time of d/c - currently not on any anticoag - recheck of PLT count suggested in next 5-7 days   MRSA screen +  Usual contact precautions     Medication List    STOP taking these medications       amLODipine 5 MG tablet  Commonly known as:  NORVASC     divalproex 125 MG capsule  Commonly known as:  DEPAKOTE SPRINKLE     guaiFENesin 600 MG 12 hr tablet  Commonly known as:  MUCINEX  Replaced by:  guaifenesin 100 MG/5ML syrup     metoprolol tartrate 25 MG tablet  Commonly known as:  LOPRESSOR  Replaced by:  metoprolol tartrate 25 mg/10 mL Susp     pantoprazole 40 MG tablet  Commonly known as:  PROTONIX  Replaced by:  pantoprazole sodium 40 mg/20 mL Pack     potassium chloride SA 20 MEQ tablet  Commonly known as:  K-DUR,KLOR-CON      TAKE these medications       acetaminophen 325 MG tablet  Commonly known as:  TYLENOL  Place 2 tablets (650 mg total) into feeding tube every 6 (six) hours as needed for mild pain (or Fever >/= 101).     acetaminophen 650 MG suppository  Commonly known as:  TYLENOL  Place 1 suppository (650 mg total) rectally every 6 (six) hours as needed for mild pain (or Fever >/= 101).     amiodarone 200 MG tablet  Commonly known as:  PACERONE  Place 1 tablet (200 mg total) into feeding tube 2 (two) times daily.     antiseptic oral rinse Liqd  15 mLs by Mouth Rinse route 2 times daily at 12 noon and 4 pm.     aspirin 81 MG chewable tablet  Place 1 tablet (81 mg total) into feeding tube daily.     chlorhexidine 0.12 % solution  Commonly known as:  PERIDEX  15 mLs by Mouth Rinse route 2 (two) times daily.     diltiazem 10 mg/ml oral suspension  Commonly known as:  CARDIZEM  Place 12 mLs  (120 mg total) into feeding tube every 8 (eight) hours.     feeding supplement (JEVITY 1.2 CAL) Liqd  Place 1,000 mLs into feeding tube continuous.     feeding supplement (PRO-STAT SUGAR FREE 64) Liqd  Place 30 mLs into feeding tube 2 (two) times daily.     free water Soln  Place 250 mLs into feeding tube every 6 (six) hours.     guaifenesin 100 MG/5ML syrup  Commonly known as:  ROBITUSSIN  Place 30 mLs (600 mg total) into feeding tube every 6 (six) hours as needed for cough or congestion.     metoprolol tartrate 25  mg/10 mL Susp  Commonly known as:  LOPRESSOR  Place 10 mLs (25 mg total) into feeding tube 2 (two) times daily.     ondansetron 4 MG tablet  Commonly known as:  ZOFRAN  Place 1 tablet (4 mg total) into feeding tube every 6 (six) hours as needed for nausea.     oxyCODONE 5 MG/5ML solution  Commonly known as:  ROXICODONE  Place 5-10 mLs (5-10 mg total) into feeding tube every 4 (four) hours as needed for moderate pain or severe pain.     pantoprazole sodium 40 mg/20 mL Pack  Commonly known as:  PROTONIX  Place 20 mLs (40 mg total) into feeding tube daily.     potassium chloride 20 MEQ/15ML (10%) solution  Place 15 mLs (20 mEq total) into feeding tube 3 (three) times daily.     sorbitol 70 % Soln  Place 30 mLs into feeding tube daily as needed for moderate constipation.     Valproic Acid 250 MG/5ML Syrp syrup  Commonly known as:  DEPAKENE  Place 15 mLs (750 mg total) into feeding tube 2 (two) times daily.       Day of Discharge BP 126/64  Pulse 76  Temp(Src) 98.4 F (36.9 C) (Oral)  Resp 20  Ht 6\' 2"  (1.88 m)  Wt 75.887 kg (167 lb 4.8 oz)  BMI 21.47 kg/m2  SpO2 96%  Physical Exam: General: No acute respiratory distress Lungs: Clear to auscultation bilaterally without wheezes or crackles Cardiovascular: Regular rate and rhythm without murmur gallop or rub normal S1 and S2 Abdomen: Nontender, nondistended, soft, bowel sounds positive, no rebound, no  ascites, no appreciable mass - abdom binder in place - PEG insertion site clean and dry  Extremities: No significant cyanosis, clubbing, 2+ unilateral edema R lower extremity - R UE with 1+ stable edema   BASIC METABOLIC PANEL     Status: Abnormal   Collection Time    10/23/13  6:36 AM      Result Value Range   Sodium 147 (*) 135 - 145 mEq/L   Potassium 3.7  3.5 - 5.1 mEq/L   Chloride 113 (*) 96 - 112 mEq/L   CO2 26  19 - 32 mEq/L   Glucose, Bld 258 (*) 70 - 99 mg/dL   BUN 15  6 - 23 mg/dL   Creatinine, Ser 1.61  0.50 - 1.35 mg/dL   Calcium 7.2 (*) 8.4 - 10.5 mg/dL   GFR calc non Af Amer 82 (*) >90 mL/min   GFR calc Af Amer >90  >90 mL/min  CBC     Status: Abnormal   Collection Time    10/23/13  6:36 AM      Result Value Range   WBC 11.9 (*) 4.0 - 10.5 K/uL   RBC 3.08 (*) 4.22 - 5.81 MIL/uL   Hemoglobin 8.8 (*) 13.0 - 17.0 g/dL   HCT 09.6 (*) 04.5 - 40.9 %   MCV 87.0  78.0 - 100.0 fL   MCH 28.6  26.0 - 34.0 pg   MCHC 32.8  30.0 - 36.0 g/dL   RDW 81.1 (*) 91.4 - 78.2 %   Platelets 44 (*) 150 - 400 K/uL    Time spent in discharge (includes decision making & examination of pt): >30 minutes  10/23/2013, 11:45 AM   Lonia Blood, MD [facility] Office  [phone] Pager [phone]  On-Call/Text Page:      [email or web address]      password [facility]

## 2013-10-23 NOTE — Clinical Social Work Note (Signed)
Per MD patient ready to DC back to [facility]. Patient's son, RN, and facility notified of DC. [facility] admission coordinator states that they are ready for patient to return. Ambulance transport requested for patient. RN given number for report. DC packet left with chart. CSW confirmed [facility] authorization for SNF. CSW signing off at this time.   Roddie Mc, [place], [place], [phone]

## 2013-10-23 NOTE — Progress Notes (Signed)
Per MD order, central line removed. IV cathter intact. Vaseline pressure gauze to site, pressure held x 5 min, no bleeding to site. Pt family instructed to keep dressing CDI x 24 hours. Pt family states understanding  .Francisco Johnston

## 2013-10-23 NOTE — Progress Notes (Signed)
NUTRITION FOLLOW UP  Intervention:   Continue Jevity 1.2 by 10 mL q 6 hrs to 60 mL/hr with Prostat BID provide 1928 kcal, 110g protein, and 1167 mL free water.  Continue free water of 250 ml QID - this provides an additional 1 liter fluid. RD to continue to follow nutrition care plan.  Nutrition Dx:   Malnutrition related to inadequate oral intake as evidenced by severe depletion of muscle mass and subcutaneous fat mass with 11% weight loss within the past month; ongoing.   Goal:  Intake to meet >90% of estimated nutrition needs, met.   Monitor:  TF initiation/tolerance, labs, weight trend.  Assessment:   Patient with a past medical history of left frontal hemorrhagic CVA, discharged from the stroke service on 08/06/2013 to skilled nursing facility; was transferred to the ED from his SNF on 11/5 with complaints of overall functional decline/failure to thrive.  Severe malnutrition identified by RD on admission.   Per MD likely aspiration PNA, pt needs to be NPO. SLP has signed off as pt/son are pursuing PEG placement. Per MD note, prognosis is poor.   Underwent PEG placement on 11/4. Pt currently receiving Jevity 1.2 @ 60 mL/hr with 30 ml Prostat liquid protein via tube BID. This regimen provides 1928 kcal, 110g protein, and 1167 mL free water. RN reports that pt is tolerating regimen well at this time. Flexiseal in place.  Potassium and magnesium are currently WNL. Most recent phosphorus on 11/11 was low.   Height: Ht Readings from Last 1 Encounters:  10/12/13 6\' 2"  (1.88 m)    Weight Status:   Wt Readings from Last 1 Encounters:  10/23/13 167 lb 4.8 oz (75.887 kg)  Admission weight 129 lb (58.9 kg) - pt is net +16 liters since admit  Re-estimated needs:  Kcal: 1900-2100  Protein: 100-120 gm  Fluid: 1.9-2.1 L  Skin: abdomen incision  Diet Order: NPO   Intake/Output Summary (Last 24 hours) at 10/23/13 1016 Last data filed at 10/23/13 0600  Gross per 24 hour  Intake    2438 ml  Output      0 ml  Net   2438 ml    Last BM: 11/17, diarrhea via flexiseal   Labs:   Recent Labs Lab 10/17/13 0545  10/18/13 1600  10/21/13 0421 10/22/13 0650 10/23/13 0636  NA 142  < >  --   < > 148* 149* 147*  K 3.7  < > 3.1*  < > 4.0 3.4* 3.7  CL 109  < >  --   < > 115* 115* 113*  CO2 20  < >  --   < > 28 25 26   BUN 14  < >  --   < > 13 14 15   CREATININE 0.82  < >  --   < > 0.77 0.84 0.86  CALCIUM 7.3*  < >  --   < > 7.5* 7.4* 7.2*  MG 2.3  --  2.1  --   --   --   --   PHOS 2.2*  --   --   --   --   --   --   GLUCOSE 153*  < >  --   < > 109* 190* 258*  < > = values in this interval not displayed.  CBG (last 3)   Recent Labs  10/23/13 0008 10/23/13 0352 10/23/13 0824  GLUCAP 242* 206* 230*    Scheduled Meds: . amiodarone  200 mg Per Tube BID  .  antiseptic oral rinse  15 mL Mouth Rinse q12n4p  . aspirin  81 mg Per Tube Daily  . chlorhexidine  15 mL Mouth Rinse BID  . diltiazem  120 mg Per Tube Q8H  . feeding supplement (PRO-STAT SUGAR FREE 64)  30 mL Per Tube BID  . free water  250 mL Per Tube Q6H  . guaifenesin  600 mg Per Tube BID  . metoprolol tartrate  25 mg Per Tube BID  . pantoprazole sodium  40 mg Per Tube Daily  . potassium chloride  20 mEq Per Tube TID  . sodium chloride  3 mL Intravenous Q12H  . Valproic Acid  750 mg Per Tube BID    Continuous Infusions: . sodium chloride 20 mL/hr at 10/22/13 1409  . feeding supplement (JEVITY 1.2 CAL) 1,000 mL (10/23/13 0553)    Jarold Motto MS, RD, LDN Pager: [phone] After-hours pager: [phone]

## 2013-10-23 NOTE — Plan of Care (Signed)
Problem: Phase III Progression Outcomes Goal: Tolerating diet Outcome: Not Applicable Date Met:  10/23/13 Enteral feedings tolerated, pt NPO

## 2013-10-23 NOTE — Progress Notes (Signed)
Francisco Johnston to be D/C'd Skilled nursing facility [facility] per MD order.      Medication List    STOP taking these medications       amLODipine 5 MG tablet  Commonly known as:  NORVASC     divalproex 125 MG capsule  Commonly known as:  DEPAKOTE SPRINKLE     guaiFENesin 600 MG 12 hr tablet  Commonly known as:  MUCINEX  Replaced by:  guaifenesin 100 MG/5ML syrup     metoprolol tartrate 25 MG tablet  Commonly known as:  LOPRESSOR  Replaced by:  metoprolol tartrate 25 mg/10 mL Susp     pantoprazole 40 MG tablet  Commonly known as:  PROTONIX  Replaced by:  pantoprazole sodium 40 mg/20 mL Pack     potassium chloride SA 20 MEQ tablet  Commonly known as:  K-DUR,KLOR-CON      TAKE these medications       acetaminophen 325 MG tablet  Commonly known as:  TYLENOL  Place 2 tablets (650 mg total) into feeding tube every 6 (six) hours as needed for mild pain (or Fever >/= 101).     acetaminophen 650 MG suppository  Commonly known as:  TYLENOL  Place 1 suppository (650 mg total) rectally every 6 (six) hours as needed for mild pain (or Fever >/= 101).     amiodarone 200 MG tablet  Commonly known as:  PACERONE  Place 1 tablet (200 mg total) into feeding tube 2 (two) times daily.     antiseptic oral rinse Liqd  15 mLs by Mouth Rinse route 2 times daily at 12 noon and 4 pm.     aspirin 81 MG chewable tablet  Place 1 tablet (81 mg total) into feeding tube daily.     chlorhexidine 0.12 % solution  Commonly known as:  PERIDEX  15 mLs by Mouth Rinse route 2 (two) times daily.     diltiazem 10 mg/ml oral suspension  Commonly known as:  CARDIZEM  Place 12 mLs (120 mg total) into feeding tube every 8 (eight) hours.     feeding supplement (JEVITY 1.2 CAL) Liqd  Place 1,000 mLs into feeding tube continuous.     feeding supplement (PRO-STAT SUGAR FREE 64) Liqd  Place 30 mLs into feeding tube 2 (two) times daily.     free water Soln  Place 250 mLs into feeding tube every 6  (six) hours.     guaifenesin 100 MG/5ML syrup  Commonly known as:  ROBITUSSIN  Place 30 mLs (600 mg total) into feeding tube every 6 (six) hours as needed for cough or congestion.     metoprolol tartrate 25 mg/10 mL Susp  Commonly known as:  LOPRESSOR  Place 10 mLs (25 mg total) into feeding tube 2 (two) times daily.     ondansetron 4 MG tablet  Commonly known as:  ZOFRAN  Place 1 tablet (4 mg total) into feeding tube every 6 (six) hours as needed for nausea.     oxyCODONE 5 MG/5ML solution  Commonly known as:  ROXICODONE  Place 5-10 mLs (5-10 mg total) into feeding tube every 4 (four) hours as needed for moderate pain or severe pain.     pantoprazole sodium 40 mg/20 mL Pack  Commonly known as:  PROTONIX  Place 20 mLs (40 mg total) into feeding tube daily.     potassium chloride 20 MEQ/15ML (10%) solution  Place 15 mLs (20 mEq total) into feeding tube 3 (three) times daily.  sorbitol 70 % Soln  Place 30 mLs into feeding tube daily as needed for moderate constipation.     Valproic Acid 250 MG/5ML Syrp syrup  Commonly known as:  DEPAKENE  Place 15 mLs (750 mg total) into feeding tube 2 (two) times daily.        VVS, Skin clean, dry and intact without evidence of skin break down, no evidence of skin tears noted. IV catheter discontinued intact. Rectal tube D/C. Pt left with condom cath Site without signs and symptoms of complications. Dressing and pressure applied.  An After Visit Summary was printed and given to the facility. Follow up appointments , new prescriptions and medication administration times given and report given to pt's nurse at [facility] Patient escorted via stretcher with PTAR, and D/C to [facility], Francisco Johnston, [place] 10/23/2013 3:19 PM

## 2013-10-23 NOTE — Plan of Care (Signed)
Problem: Consults Goal: Nutrition Consult-if indicated Outcome: Completed/Met Date Met:  10/23/13 Dietician saw pt on 10/18/13

## 2013-10-23 NOTE — Progress Notes (Signed)
Physical Therapy Treatment Patient Details Name: Francisco Johnston MRN: [id number] DOB: [birth year] Today's Date: 10/23/2013 Time: 9562-1308 PT Time Calculation (min): 23 min  PT Assessment / Plan / Recommendation  History of Present Illness Francisco Johnston is a 79 y.o. male with a past medical history of left frontal hemorrhagic CVA, discharged from the stroke service on 08/06/2013 to skilled nursing facility, was transferred to the emergent apartment from his SNF this evening with complaints of overall functional decline/failure to thrive.    PT Comments   Son present in room and obviously aware of patient's decline as he reported doubt that pt could sit up at edge of bed (as on initial eval.)  Patient lethargic; responded only on couple of occasions during session; had limited tolerance even to in bed ROM activities.  Feel prognosis is limited, but needs eval by PT in facility to determine potential and tolerance to gentle comfort measures through therapy.   Follow Up Recommendations  SNF           Equipment Recommendations  None recommended by PT       Frequency Min 2X/week   Progress towards PT Goals Progress towards PT goals: Not progressing toward goals - comment  Plan Current plan remains appropriate    Precautions / Restrictions Precautions Precautions: Fall Precaution Comments: Orange Isolation precautions   Pertinent Vitals/Pain PAINAID 6/10    Mobility  Bed Mobility Bed Mobility: Scooting to HOB Scooting to HOB: 1: +2 Total assist Scooting to [facility]: Patient Percentage: 0% Details for Bed Mobility Assistance: scooted up with assist, then pt in truncal extension and extension of left arm.  Calmed and placed in upright position to facilitate flexion and pt demonstrated decreased distress Transfers Transfers: Not assessed Ambulation/Gait Ambulation/Gait Assistance: Not tested (comment)    Exercises General Exercises - Upper Extremity Shoulder Flexion: PROM;Right;10  reps;Supine Elbow Extension: PROM;Right;10 reps;Supine Wrist Flexion: PROM;Right;10 reps;Supine Wrist Extension: PROM;Right;10 reps;Supine General Exercises - Lower Extremity Ankle Circles/Pumps: PROM;Both;10 reps;Supine Heel Slides: PROM;AAROM;10 reps;Right;Left;Supine Hip ABduction/ADduction: PROM;Both;10 reps;Supine     PT Goals (current goals can now be found in the care plan section)    Visit Information  Last PT Received On: 10/23/13 Assistance Needed: +2 History of Present Illness: Francisco Johnston is a 79 y.o. male with a past medical history of left frontal hemorrhagic CVA, discharged from the stroke service on 08/06/2013 to skilled nursing facility, was transferred to the emergent apartment from his SNF this evening with complaints of overall functional decline/failure to thrive.     Subjective Data      Cognition  Cognition Arousal/Alertness: Lethargic Behavior During Therapy: Flat affect Overall Cognitive Status: Difficult to assess Difficult to assess due to: Impaired communication       End of Session PT - End of Session Equipment Utilized During Treatment: Oxygen Activity Tolerance: Patient limited by lethargy Patient left: in bed;with call bell/phone within reach   GP     [facility] 10/23/2013, 2:34 PM [place],  [phone] 10/23/2013

## 2013-10-24 NOTE — Clinical Social Work Psychosocial (Addendum)
LATE ENTRY: ASSESSMENT OCCURRED ON 10/16/13  Clinical Social Work Department BRIEF PSYCHOSOCIAL ASSESSMENT 10/24/2013  Patient:  Francisco Johnston, Francisco Johnston     Account Number:  [id number]     Admit date:  10/11/2013  Clinical Social Worker:  Varney Biles  Date/Time:  10/16/2013 07:45 AM  Referred by:  Physician  Date Referred:  10/16/2013 Referred for  SNF Placement   Other Referral:   Interview type:  Patient Other interview type:    PSYCHOSOCIAL DATA Living Status:  FACILITY Admitted from facility:  [facility] Level of care:  Skilled Nursing Facility Primary support name:  Aleksei Goodlin Primary support relationship to patient:  SPOUSE Degree of support available:   Good--pt has a spouse and 2 sons.    CURRENT CONCERNS Current Concerns  Post-Acute Placement   Other Concerns:    SOCIAL WORK ASSESSMENT / PLAN Pt from [facility]. CSW introduced self and got permission to contact [facility]. Pt can go back to facility when ready for discharge. CSW to obtain insurance auth from [place].   Assessment/plan status:  Psychosocial Support/Ongoing Assessment of Needs Other assessment/ plan:   Information/referral to community resources:   SNF [facility]).    PATIENT'S/FAMILY'S RESPONSE TO PLAN OF CARE: Pt receptive to CSW visit and understanding of CSW role.       Maryclare Labrador, MSW, [facility] Clinical Social Worker [phone]

## 2013-10-25 ENCOUNTER — Inpatient Hospital Stay (HOSPITAL_COMMUNITY)
Admission: AD | Admit: 2013-10-25 | Discharge: 2013-11-06 | DRG: 871 | Disposition: E | Payer: Medicare Other | Source: Other Acute Inpatient Hospital | Attending: Internal Medicine | Admitting: Internal Medicine

## 2013-10-25 ENCOUNTER — Inpatient Hospital Stay (HOSPITAL_COMMUNITY): Payer: Medicare Other

## 2013-10-25 ENCOUNTER — Encounter (HOSPITAL_COMMUNITY): Payer: Self-pay | Admitting: General Practice

## 2013-10-25 DIAGNOSIS — R569 Unspecified convulsions: Secondary | ICD-10-CM | POA: Diagnosis present

## 2013-10-25 DIAGNOSIS — I259 Chronic ischemic heart disease, unspecified: Secondary | ICD-10-CM

## 2013-10-25 DIAGNOSIS — IMO0002 Reserved for concepts with insufficient information to code with codable children: Secondary | ICD-10-CM

## 2013-10-25 DIAGNOSIS — I69920 Aphasia following unspecified cerebrovascular disease: Secondary | ICD-10-CM

## 2013-10-25 DIAGNOSIS — J189 Pneumonia, unspecified organism: Secondary | ICD-10-CM | POA: Diagnosis present

## 2013-10-25 DIAGNOSIS — I69959 Hemiplegia and hemiparesis following unspecified cerebrovascular disease affecting unspecified side: Secondary | ICD-10-CM

## 2013-10-25 DIAGNOSIS — J690 Pneumonitis due to inhalation of food and vomit: Secondary | ICD-10-CM | POA: Diagnosis present

## 2013-10-25 DIAGNOSIS — R627 Adult failure to thrive: Secondary | ICD-10-CM | POA: Diagnosis present

## 2013-10-25 DIAGNOSIS — I619 Nontraumatic intracerebral hemorrhage, unspecified: Secondary | ICD-10-CM | POA: Diagnosis present

## 2013-10-25 DIAGNOSIS — I639 Cerebral infarction, unspecified: Secondary | ICD-10-CM | POA: Diagnosis present

## 2013-10-25 DIAGNOSIS — I4891 Unspecified atrial fibrillation: Secondary | ICD-10-CM | POA: Diagnosis present

## 2013-10-25 DIAGNOSIS — I6350 Cerebral infarction due to unspecified occlusion or stenosis of unspecified cerebral artery: Secondary | ICD-10-CM

## 2013-10-25 DIAGNOSIS — I2510 Atherosclerotic heart disease of native coronary artery without angina pectoris: Secondary | ICD-10-CM | POA: Diagnosis present

## 2013-10-25 DIAGNOSIS — D696 Thrombocytopenia, unspecified: Secondary | ICD-10-CM | POA: Diagnosis present

## 2013-10-25 DIAGNOSIS — Z66 Do not resuscitate: Secondary | ICD-10-CM | POA: Diagnosis present

## 2013-10-25 DIAGNOSIS — I10 Essential (primary) hypertension: Secondary | ICD-10-CM | POA: Diagnosis present

## 2013-10-25 DIAGNOSIS — A419 Sepsis, unspecified organism: Principal | ICD-10-CM | POA: Diagnosis present

## 2013-10-25 DIAGNOSIS — I69921 Dysphasia following unspecified cerebrovascular disease: Secondary | ICD-10-CM

## 2013-10-25 DIAGNOSIS — I129 Hypertensive chronic kidney disease with stage 1 through stage 4 chronic kidney disease, or unspecified chronic kidney disease: Secondary | ICD-10-CM | POA: Diagnosis present

## 2013-10-25 DIAGNOSIS — I69351 Hemiplegia and hemiparesis following cerebral infarction affecting right dominant side: Secondary | ICD-10-CM

## 2013-10-25 DIAGNOSIS — G40802 Other epilepsy, not intractable, without status epilepticus: Secondary | ICD-10-CM | POA: Diagnosis present

## 2013-10-25 DIAGNOSIS — Z8701 Personal history of pneumonia (recurrent): Secondary | ICD-10-CM

## 2013-10-25 DIAGNOSIS — N179 Acute kidney failure, unspecified: Secondary | ICD-10-CM | POA: Diagnosis present

## 2013-10-25 DIAGNOSIS — J9600 Acute respiratory failure, unspecified whether with hypoxia or hypercapnia: Secondary | ICD-10-CM | POA: Diagnosis present

## 2013-10-25 DIAGNOSIS — Z86718 Personal history of other venous thrombosis and embolism: Secondary | ICD-10-CM

## 2013-10-25 DIAGNOSIS — E870 Hyperosmolality and hypernatremia: Secondary | ICD-10-CM | POA: Diagnosis present

## 2013-10-25 DIAGNOSIS — G9341 Metabolic encephalopathy: Secondary | ICD-10-CM | POA: Diagnosis present

## 2013-10-25 DIAGNOSIS — Z515 Encounter for palliative care: Secondary | ICD-10-CM

## 2013-10-25 DIAGNOSIS — N182 Chronic kidney disease, stage 2 (mild): Secondary | ICD-10-CM | POA: Diagnosis present

## 2013-10-25 DIAGNOSIS — E43 Unspecified severe protein-calorie malnutrition: Secondary | ICD-10-CM | POA: Diagnosis present

## 2013-10-25 DIAGNOSIS — D638 Anemia in other chronic diseases classified elsewhere: Secondary | ICD-10-CM | POA: Diagnosis present

## 2013-10-25 HISTORY — DX: Unspecified convulsions: R56.9

## 2013-10-25 LAB — COMPREHENSIVE METABOLIC PANEL
ALT: 8 U/L (ref 0–53)
AST: 15 U/L (ref 0–37)
Albumin: 0.8 g/dL — ABNORMAL LOW (ref 3.5–5.2)
Alkaline Phosphatase: 71 U/L (ref 39–117)
BUN: 19 mg/dL (ref 6–23)
CO2: 23 mEq/L (ref 19–32)
Chloride: 116 mEq/L — ABNORMAL HIGH (ref 96–112)
Creatinine, Ser: 1.05 mg/dL (ref 0.50–1.35)
GFR calc non Af Amer: 67 mL/min — ABNORMAL LOW (ref >90)
Potassium: 4.2 mEq/L (ref 3.5–5.1)
Sodium: 147 mEq/L — ABNORMAL HIGH (ref 135–145)
Total Bilirubin: 0.4 mg/dL (ref 0.3–1.2)
Total Protein: 6.1 g/dL (ref 6.0–8.3)

## 2013-10-25 LAB — CBC
HCT: 23.1 % — ABNORMAL LOW (ref 39.0–52.0)
MCH: 29.1 pg (ref 26.0–34.0)
MCHC: 32.5 g/dL (ref 30.0–36.0)
MCV: 89.5 fL (ref 78.0–100.0)
Platelets: 48 10*3/uL — ABNORMAL LOW (ref 150–400)
RDW: 16.2 % — ABNORMAL HIGH (ref 11.5–15.5)
WBC: 10.9 10*3/uL — ABNORMAL HIGH (ref 4.0–10.5)

## 2013-10-25 MED ORDER — CHLORHEXIDINE GLUCONATE 0.12 % MT SOLN
15.0000 mL | Freq: Two times a day (BID) | OROMUCOSAL | Status: DC
  Administered 2013-10-25 – 2013-10-26 (×3): 15 mL via OROMUCOSAL
  Filled 2013-10-25 (×4): qty 15

## 2013-10-25 MED ORDER — BIOTENE DRY MOUTH MT LIQD
15.0000 mL | Freq: Two times a day (BID) | OROMUCOSAL | Status: DC
  Administered 2013-10-26 (×2): 15 mL via OROMUCOSAL

## 2013-10-25 MED ORDER — ALBUTEROL SULFATE (5 MG/ML) 0.5% IN NEBU: 2.5000 mg | INHALATION_SOLUTION | Freq: Four times a day (QID) | RESPIRATORY_TRACT | Status: DC | PRN

## 2013-10-25 MED ORDER — VANCOMYCIN HCL IN DEXTROSE 1-5 GM/200ML-% IV SOLN
1000.0000 mg | Freq: Three times a day (TID) | INTRAVENOUS | Status: DC
  Administered 2013-10-25 – 2013-10-26 (×2): 1000 mg via INTRAVENOUS
  Filled 2013-10-25 (×4): qty 200

## 2013-10-25 MED ORDER — PRO-STAT SUGAR FREE PO LIQD
30.0000 mL | Freq: Two times a day (BID) | ORAL | Status: DC
  Administered 2013-10-25 – 2013-10-26 (×2): 30 mL
  Filled 2013-10-25 (×2): qty 30

## 2013-10-25 MED ORDER — AMIODARONE HCL 200 MG PO TABS
200.0000 mg | ORAL_TABLET | Freq: Two times a day (BID) | ORAL | Status: DC
  Administered 2013-10-25 – 2013-10-27 (×4): 200 mg
  Filled 2013-10-25 (×5): qty 1

## 2013-10-25 MED ORDER — DEXTROSE 5 % IV SOLN
1.0000 g | Freq: Two times a day (BID) | INTRAVENOUS | Status: DC
  Administered 2013-10-26 (×2): 1 g via INTRAVENOUS
  Filled 2013-10-25 (×4): qty 1

## 2013-10-25 MED ORDER — PANTOPRAZOLE SODIUM 40 MG PO PACK
40.0000 mg | PACK | Freq: Every day | ORAL | Status: DC
  Administered 2013-10-26 – 2013-10-27 (×2): 40 mg
  Filled 2013-10-25 (×3): qty 20

## 2013-10-25 MED ORDER — JEVITY 1.2 CAL PO LIQD
1000.0000 mL | ORAL | Status: DC
  Administered 2013-10-25: 18:00:00 60 mL
  Filled 2013-10-25 (×3): qty 1000

## 2013-10-25 MED ORDER — ACETAMINOPHEN 650 MG RE SUPP: 650.0000 mg | Freq: Four times a day (QID) | RECTAL | Status: DC | PRN

## 2013-10-25 MED ORDER — POTASSIUM CHLORIDE 20 MEQ/15ML (10%) PO LIQD
20.0000 meq | Freq: Three times a day (TID) | ORAL | Status: DC
  Administered 2013-10-25 – 2013-10-27 (×6): 20 meq
  Filled 2013-10-25 (×8): qty 15

## 2013-10-25 MED ORDER — FREE WATER
250.0000 mL | Freq: Four times a day (QID) | Status: DC
  Administered 2013-10-25 – 2013-10-26 (×4): 250 mL

## 2013-10-25 MED ORDER — SODIUM CHLORIDE 0.9 % IV SOLN
INTRAVENOUS | Status: DC
  Administered 2013-10-25 – 2013-10-26 (×2): via INTRAVENOUS

## 2013-10-25 MED ORDER — SORBITOL 70 % SOLN: 30.0000 mL | Freq: Every day | Status: DC | PRN

## 2013-10-25 MED ORDER — VALPROIC ACID 250 MG/5ML PO SYRP
750.0000 mg | ORAL_SOLUTION | Freq: Two times a day (BID) | ORAL | Status: DC
  Administered 2013-10-25 – 2013-10-27 (×4): 750 mg
  Filled 2013-10-25 (×5): qty 15

## 2013-10-25 MED ORDER — METOPROLOL TARTRATE 25 MG/10 ML ORAL SUSPENSION
25.0000 mg | Freq: Two times a day (BID) | ORAL | Status: DC
  Administered 2013-10-25 – 2013-10-27 (×4): 25 mg
  Filled 2013-10-25 (×5): qty 10

## 2013-10-25 MED ORDER — ACETAMINOPHEN 325 MG PO TABS: 650.0000 mg | ORAL_TABLET | Freq: Four times a day (QID) | ORAL | Status: DC | PRN

## 2013-10-25 MED ORDER — SODIUM CHLORIDE 0.9 % IJ SOLN: 3.0000 mL | Freq: Two times a day (BID) | INTRAMUSCULAR | Status: DC

## 2013-10-25 MED ORDER — OXYCODONE HCL 5 MG/5ML PO SOLN: 5.0000 mg | ORAL | Status: DC | PRN

## 2013-10-25 MED ORDER — DILTIAZEM 12 MG/ML ORAL SUSPENSION
120.0000 mg | Freq: Three times a day (TID) | ORAL | Status: DC
  Administered 2013-10-25 – 2013-10-27 (×5): 120 mg
  Filled 2013-10-25 (×9): qty 12

## 2013-10-25 MED ORDER — ONDANSETRON HCL 4 MG PO TABS: 4.0000 mg | ORAL_TABLET | Freq: Four times a day (QID) | ORAL | Status: DC | PRN

## 2013-10-25 MED ORDER — GUAIFENESIN 100 MG/5ML PO SYRP
600.0000 mg | ORAL_SOLUTION | Freq: Four times a day (QID) | ORAL | Status: DC | PRN
  Filled 2013-10-25: qty 30

## 2013-10-25 NOTE — H&P (Signed)
[facility] History and Physical  Francisco Johnston WUJ:[id number] DOB: 1934-04-12 DOA: 10/17/2013  Referring physician:  PCP: [name], MD  Specialists:   Chief Complaint: fever   HPI: Francisco Johnston is a 79 y.o. male with PMH of recent hemorrhagic/CVA/R sided hemiparesis, aphasia, dysphasia s/p PEG, h/o CAD/MI, DVT s/p IVC, a fib not on AC (due to hemorrhagic CVA), recent pneumonia discharged to [facility] 10/23/13 presented to [facility] ED with episode of fever 101.3; CXR showed R sided pneumonia; patient cuold not provide any history due to aphasia/CVA; per son: patient is still progressively declining, had fever at nursing facility; no nausea, vomiting, no diarrhea;   Review of Systems: The patient denies anorexia,weight loss,, vision loss, decreased hearing, hoarseness, chest pain, syncope, dyspnea on exertion, peripheral edema, balance deficits, hemoptysis, abdominal pain, melena, hematochezia, severe indigestion/heartburn, hematuria, incontinence, genital sores, muscle weakness, suspicious skin lesions, depression, unusual weight change, abnormal bleeding, enlarged lymph nodes, angioedema, and breast masses.    Past Medical History  Diagnosis Date  . Heart disease   . Hypertension   . Iron deficiency anemia 06/12/2011  . CAD (coronary artery disease)   . Noncompliance   . ED (erectile dysfunction)   . IFG (impaired fasting glucose)   . Lacunar stroke   . Myocardial infarction 1980's X2  . Prostate cancer 1990's    "took radiation tx for that" (10/11/2013)  . Adenocarcinoma of colon 06/12/2011  . Pneumonia     "maybe now, never before" (10/11/2013)  . Stroke 08/06/2013    "paralyzed on the right side since" (10/11/2013)  . Arthritis     "probably; joints" (10/11/2013)  . Chronic kidney disease     "one kidney's smaller than the other; been like that forever" (10/11/2013)   Past Surgical History  Procedure Laterality Date  . Colonoscopy    . Colectomy  ~ 2011  . Cataract  extraction Right   . Ankle fracture surgery Left 1990's    "put pins in it" (10/11/2013)   Social History:  reports that he has never smoked. He quit smokeless tobacco use about 2 months ago. His smokeless tobacco use included Snuff. He reports that he drinks alcohol. He reports that he does not use illicit drugs. SNF:  where does patient live--home, ALF, SNF? and with whom if at home? No:  Can patient participate in ADLs?  No Known Allergies  Family History  Problem Relation Age of Onset  . Cancer Father   . Cancer Sister     (be sure to complete)  Prior to Admission medications   Medication Sig Start Date End Date Taking? Authorizing Provider  acetaminophen (TYLENOL) 325 MG tablet Place 2 tablets (650 mg total) into feeding tube every 6 (six) hours as needed for mild pain (or Fever >/= 101). 10/23/13   Lonia Blood, MD  acetaminophen (TYLENOL) 650 MG suppository Place 1 suppository (650 mg total) rectally every 6 (six) hours as needed for mild pain (or Fever >/= 101). 10/23/13   Lonia Blood, MD  Amino Acids-Protein Hydrolys (FEEDING SUPPLEMENT, PRO-STAT SUGAR FREE 64,) LIQD Place 30 mLs into feeding tube 2 (two) times daily. 10/23/13   Lonia Blood, MD  amiodarone (PACERONE) 200 MG tablet Place 1 tablet (200 mg total) into feeding tube 2 (two) times daily. 10/23/13   Lonia Blood, MD  antiseptic oral rinse (BIOTENE) LIQD 15 mLs by Mouth Rinse route 2 times daily at 12 noon and 4 pm. 10/23/13   Lonia Blood, MD  aspirin 81 MG chewable tablet Place 1 tablet (81 mg total) into feeding tube daily. 10/23/13   Lonia Blood, MD  chlorhexidine (PERIDEX) 0.12 % solution 15 mLs by Mouth Rinse route 2 (two) times daily. 10/23/13   Lonia Blood, MD  diltiazem (CARDIZEM) 10 mg/ml oral suspension Place 12 mLs (120 mg total) into feeding tube every 8 (eight) hours. 10/23/13   Lonia Blood, MD  guaifenesin (ROBITUSSIN) 100 MG/5ML syrup Place 30 mLs (600 mg total)  into feeding tube every 6 (six) hours as needed for cough or congestion. 10/23/13   Lonia Blood, MD  metoprolol tartrate (LOPRESSOR) 25 mg/10 mL SUSP Place 10 mLs (25 mg total) into feeding tube 2 (two) times daily. 10/23/13   Lonia Blood, MD  Nutritional Supplements (FEEDING SUPPLEMENT, JEVITY 1.2 CAL,) LIQD Place 1,000 mLs into feeding tube continuous. 10/23/13   Lonia Blood, MD  ondansetron (ZOFRAN) 4 MG tablet Place 1 tablet (4 mg total) into feeding tube every 6 (six) hours as needed for nausea. 10/23/13   Lonia Blood, MD  oxyCODONE (ROXICODONE) 5 MG/5ML solution Place 5-10 mLs (5-10 mg total) into feeding tube every 4 (four) hours as needed for moderate pain or severe pain. 10/23/13   Lonia Blood, MD  pantoprazole sodium (PROTONIX) 40 mg/20 mL PACK Place 20 mLs (40 mg total) into feeding tube daily. 10/23/13   Lonia Blood, MD  potassium chloride 20 MEQ/15ML (10%) solution Place 15 mLs (20 mEq total) into feeding tube 3 (three) times daily. 10/23/13   Lonia Blood, MD  sorbitol 70 % SOLN Place 30 mLs into feeding tube daily as needed for moderate constipation. 10/23/13   Lonia Blood, MD  Valproic Acid (DEPAKENE) 250 MG/5ML SYRP syrup Place 15 mLs (750 mg total) into feeding tube 2 (two) times daily. 10/23/13   Lonia Blood, MD  Water For Irrigation, Sterile (FREE WATER) SOLN Place 250 mLs into feeding tube every 6 (six) hours. 10/23/13   Lonia Blood, MD   Physical Exam: There were no vitals filed for this visit.   General:  lethargic   Eyes: pupils reactive   ENT: no oral ulcers  Neck: supple   Cardiovascular: s1,s2 irregular   Respiratory: BL rales   Abdomen: soft, NT, + PEG  Skin: few ecchymosis   Musculoskeletal: R sided edema   Psychiatric: aphasic   Neurologic: R sided hemiparesis; aphasic, lethargic to perform complete neuro exam   Labs on Admission:  Basic Metabolic Panel:  Recent Labs Lab 10/18/13 1600  10/19/13 0500 10/20/13 0355 10/21/13 0421 10/22/13 0650 10/23/13 0636  NA  --  140 143 148* 149* 147*  K 3.1* 3.6 4.3 4.0 3.4* 3.7  CL  --  108 110 115* 115* 113*  CO2  --  25 25 28 25 26   GLUCOSE  --  166* 168* 109* 190* 258*  BUN  --  14 11 13 14 15   CREATININE  --  0.69 0.66 0.77 0.84 0.86  CALCIUM  --  7.2* 7.3* 7.5* 7.4* 7.2*  MG 2.1  --   --   --   --   --    Liver Function Tests: No results found for this basename: AST, ALT, ALKPHOS, BILITOT, PROT, ALBUMIN,  in the last 168 hours No results found for this basename: LIPASE, AMYLASE,  in the last 168 hours No results found for this basename: AMMONIA,  in the last 168 hours CBC:  Recent Labs  Lab 10/19/13 0500 10/20/13 0355 10/21/13 0421 10/22/13 0650 10/23/13 0636  WBC 15.6* 15.3* 12.9* 12.0* 11.9*  HGB 9.2* 9.2* 8.5* 9.6* 8.8*  HCT 27.2* 27.5* 25.1* 28.7* 26.8*  MCV 86.6 86.8 87.2 87.8 87.0  PLT 90* 83* 61* 71* 44*   Cardiac Enzymes: No results found for this basename: CKTOTAL, CKMB, CKMBINDEX, TROPONINI,  in the last 168 hours  BNP (last 3 results) No results found for this basename: PROBNP,  in the last 8760 hours CBG:  Recent Labs Lab 10/22/13 2043 10/23/13 0008 10/23/13 0352 10/23/13 0824 10/23/13 1208  GLUCAP 207* 242* 206* 230* 259*    Radiological Exams on Admission: No results found.  EKG: Independently reviewed. Not done yet   Assessment/Plan Principal Problem:   HCAP (healthcare-associated pneumonia) Active Problems:   HTN (hypertension)   CVA (cerebral vascular accident)   ICH (intracerebral hemorrhage)   Convulsions/seizures   Hemiparesis affecting right side as late effect of cerebrovascular accident   79 y.o. male with PMH of recent hemorrhagic/CVA/R sided hemiparesis, aphasia, dysphasia s/p PEG, DVT s/p IVC, CAD/MI, a fib not on AC (due to hemorrhagic CVA), recent pneumonia discharged to [facility] 10/23/13 presented with episode of fever and HCAP  1. HCAP/sepsis; suspicious for  aspiration; data from [facility] ED: CXR: R sided pneumonia; Labs: wbc-9.6; lactic acid 3.7; Hg-7.5; platelets 44; Cr 1.36; BUN-22, Na-146; (-recent admission completed Vanc/Zosyn - last dose 11/14 for pneumonia) -obtain blood c/s, CBC; start on IV vanc/cefepime; cont bronchodilators, oxygen; obtain CXR  2. Failure to thrive / functional decline from Met Encephalopathy / Severe protein calorie malnutrition  - consult nutrition; cont s/p PEG   3. PAF; cont amiodarone, metoprolol, cardizem  - echo (10/12/13) LVEF of 55-60%, normal wall motion  - not a Candidate for anticoagulation due to recent hemorrhagic CVA remains at risk for a new embolic stroke   4. History of seizures post recent hemorrhagic CVA with chronic right-sided hemiparesis, aphasia, and dysphagia  -continue his home dose of valproic acid   5. R Leg DVT(he has hemiparesis on the right side); patient remains very high risk for repeat bleed on full anticoagulation  -IVC filter placed by IR 11/12   6. Thrombocytopenia of unclear etiology; ? MDS vs acute illness/sepsis; no s/s of acute bleeding; monitor   7. Anemia chronic; Hg 7.5 per ED labs; no s/s of acute bleeding; repeat Hg; Tf < 7.0  8. AKI on CKDII/dehyrdation/hyper Na; cont  IVF, monitor lytes   D/w his son at the bedside; patient is DNR; may need  Palliative care/? hospice in near future if no improvement  -his contacts: Francisco Johnston,Francisco Johnston Son [phone]   None  if consultant consulted, please document name and whether formally or informally consulted  Code Status: DNR (must indicate code status--if unknown or must be presumed, indicate so) Family Communication: son at the bedside (indicate person spoken with, if applicable, with phone number if by telephone) Disposition Plan: snf when ready  (indicate anticipated LOS)  Time spent: > 45 minutes   Esperanza Sheets [facility] Pager [phone]  If 7PM-7AM, please contact  night-coverage [email or web address] Password [facility] 2013-11-24, 3:46 PM

## 2013-10-25 NOTE — Progress Notes (Signed)
ANTIBIOTIC CONSULT NOTE - INITIAL  Pharmacy Consult for vancomycin and cefepime Indication: pneumonia - HCAP  No Known Allergies  Patient Measurements:   Adjusted Body Weight: 76 kg  Vital Signs: Temp: 96.2 F (35.7 C) (11/19 1615) Temp src: Axillary (11/19 1615) BP: 91/64 mmHg (11/19 1615) Pulse Rate: 109 (11/19 1615) Intake/Output from previous day:   Intake/Output from this shift:    Labs:  Recent Labs  10/23/13 0636  WBC 11.9*  HGB 8.8*  PLT 44*  CREATININE 0.86   The CrCl is unknown because both a height and weight (above a minimum accepted value) are required for this calculation. No results found for this basename: VANCOTROUGH, Leodis Binet, VANCORANDOM, GENTTROUGH, GENTPEAK, GENTRANDOM, TOBRATROUGH, TOBRAPEAK, TOBRARND, AMIKACINPEAK, AMIKACINTROU, AMIKACIN,  in the last 72 hours   Microbiology: Recent Results (from the past 720 hour(s))  MRSA PCR SCREENING     Status: Abnormal   Collection Time    10/11/13  8:07 PM      Result Value Range Status   MRSA by PCR POSITIVE (*) NEGATIVE Final   Comment:            The GeneXpert MRSA Assay (FDA     approved for NASAL specimens     only), is one component of a     comprehensive MRSA colonization     surveillance program. It is not     intended to diagnose MRSA     infection nor to guide or     monitor treatment for     MRSA infections.     RESULT CALLED TO, READ BACK BY AND VERIFIED WITH:     ELANEY BY TCLEVELAND 10/12/13 AT 12:28AM  CULTURE, BLOOD (ROUTINE X 2)     Status: None   Collection Time    10/11/13  9:50 PM      Result Value Range Status   Specimen Description BLOOD LEFT HAND   Final   Special Requests BOTTLES DRAWN AEROBIC ONLY 5CC   Final   Culture  Setup Time     Final   Value: 10/12/2013 01:56     Performed at [facility]   Culture     Final   Value: NO GROWTH 5 DAYS     Performed at [facility]   Report Status 10/18/2013 FINAL   Final  CULTURE, BLOOD (ROUTINE X 2)      Status: None   Collection Time    10/11/13 10:05 PM      Result Value Range Status   Specimen Description BLOOD LEFT HAND   Final   Special Requests BOTTLES DRAWN AEROBIC ONLY 5CC   Final   Culture  Setup Time     Final   Value: 10/12/2013 01:57     Performed at [facility]   Culture     Final   Value: NO GROWTH 5 DAYS     Performed at [facility]   Report Status 10/18/2013 FINAL   Final  CLOSTRIDIUM DIFFICILE BY PCR     Status: None   Collection Time    10/15/13 11:57 PM      Result Value Range Status   C difficile by pcr NEGATIVE  NEGATIVE Final    Medical History: Past Medical History  Diagnosis Date  . Heart disease   . Hypertension   . Iron deficiency anemia 06/12/2011  . CAD (coronary artery disease)   . Noncompliance   . ED (erectile dysfunction)   . IFG (impaired fasting  glucose)   . Lacunar stroke   . Myocardial infarction 1980's X2  . Prostate cancer 1990's    "took radiation tx for that" (10/11/2013)  . Adenocarcinoma of colon 06/12/2011  . Pneumonia     "maybe now, never before" (10/11/2013)  . Stroke 08/06/2013    "paralyzed on the right side since" (10/11/2013)  . Arthritis     "probably; joints" (10/11/2013)  . Chronic kidney disease     "one kidney's smaller than the other; been like that forever" (10/11/2013)    Medications:  Prescriptions prior to admission  Medication Sig Dispense Refill  . acetaminophen (TYLENOL) 325 MG tablet Place 2 tablets (650 mg total) into feeding tube every 6 (six) hours as needed for mild pain (or Fever >/= 101).      Marland Kitchen acetaminophen (TYLENOL) 650 MG suppository Place 1 suppository (650 mg total) rectally every 6 (six) hours as needed for mild pain (or Fever >/= 101).  12 suppository  0  . Amino Acids-Protein Hydrolys (FEEDING SUPPLEMENT, PRO-STAT SUGAR FREE 64,) LIQD Place 30 mLs into feeding tube 2 (two) times daily.  900 mL  0  . amiodarone (PACERONE) 200 MG tablet Place 1 tablet (200 mg total) into feeding  tube 2 (two) times daily.      Marland Kitchen antiseptic oral rinse (BIOTENE) LIQD 15 mLs by Mouth Rinse route 2 times daily at 12 noon and 4 pm.      . aspirin 81 MG chewable tablet Place 1 tablet (81 mg total) into feeding tube daily.      . chlorhexidine (PERIDEX) 0.12 % solution 15 mLs by Mouth Rinse route 2 (two) times daily.  120 mL  0  . diltiazem (CARDIZEM) 10 mg/ml oral suspension Place 12 mLs (120 mg total) into feeding tube every 8 (eight) hours.      Marland Kitchen guaifenesin (ROBITUSSIN) 100 MG/5ML syrup Place 30 mLs (600 mg total) into feeding tube every 6 (six) hours as needed for cough or congestion.  120 mL  0  . metoprolol tartrate (LOPRESSOR) 25 mg/10 mL SUSP Place 10 mLs (25 mg total) into feeding tube 2 (two) times daily.      . Nutritional Supplements (FEEDING SUPPLEMENT, JEVITY 1.2 CAL,) LIQD Place 1,000 mLs into feeding tube continuous.    0  . ondansetron (ZOFRAN) 4 MG tablet Place 1 tablet (4 mg total) into feeding tube every 6 (six) hours as needed for nausea.  20 tablet  0  . oxyCODONE (ROXICODONE) 5 MG/5ML solution Place 5-10 mLs (5-10 mg total) into feeding tube every 4 (four) hours as needed for moderate pain or severe pain.  15 mL  0  . pantoprazole sodium (PROTONIX) 40 mg/20 mL PACK Place 20 mLs (40 mg total) into feeding tube daily.  30 each    . potassium chloride 20 MEQ/15ML (10%) solution Place 15 mLs (20 mEq total) into feeding tube 3 (three) times daily.  500 mL  0  . sorbitol 70 % SOLN Place 30 mLs into feeding tube daily as needed for moderate constipation.      . Valproic Acid (DEPAKENE) 250 MG/5ML SYRP syrup Place 15 mLs (750 mg total) into feeding tube 2 (two) times daily.  600 mL    . Water For Irrigation, Sterile (FREE WATER) SOLN Place 250 mLs into feeding tube every 6 (six) hours.       Assessment: 79 year old man recently discharged on 11/17 presented to [facility] today with fever.  Vancomycin and cefepime to start  for HCAP.  Vancomycin 1g and cefepime 2g were given  at [facility] around 11am this morning.  Goal of Therapy:  Vancomycin trough level 15-20 mcg/ml  Plan:  Measure antibiotic drug levels at steady state Follow up culture results Vancomycin 1g IV q12 Cefepime 1g IV q8h Monitor renal function  Mickeal Skinner 10/22/2013,4:53 PM

## 2013-10-25 NOTE — Progress Notes (Signed)
Francisco Johnston [id number] Code Status: DNR   Admission Data: 11-07-2013 5:51 PM Attending Provider:  York Spaniel  WUJ:WJXBJY,N S, MD Consults/ Treatment Team:    Francisco Johnston is a 79 y.o. male patient admitted from [facility] ED awake, responsive tp touch and nonverbal- no acute distress noted.  VSS - Blood pressure 91/64, pulse 109, temperature 96.2 F (35.7 C), temperature source Axillary, resp. rate 18, SpO2 96.00%.  no c/o shortness of breath, no c/o chest pain. Cardiac tele #01, in place, cardiac monitor yields: A fib O2:   4L  IV Fluids:  IV in place, occlusive dsg intact without redness, IV cath forearm right infusing NS at [facility]. No s/s of infiltration or phlebitis.   Allergies:  No Known Allergies   Past Medical History  Diagnosis Date  . Heart disease   . Hypertension   . Iron deficiency anemia 06/12/2011  . CAD (coronary artery disease)   . Noncompliance   . ED (erectile dysfunction)   . IFG (impaired fasting glucose)   . Lacunar stroke   . Myocardial infarction 1980's X2  . Prostate cancer 1990's    "took radiation tx for that" (10/11/2013)  . Adenocarcinoma of colon 06/12/2011  . Pneumonia     "maybe now, never before" (10/11/2013)  . Stroke 08/06/2013    "paralyzed on the right side since" (10/11/2013)  . Arthritis     "probably; joints" (10/11/2013)  . Chronic kidney disease     "one kidney's smaller than the other; been like that forever" (10/11/2013)  . Seizures    Medications Prior to Admission  Medication Sig Dispense Refill  . acetaminophen (TYLENOL) 325 MG tablet Place 2 tablets (650 mg total) into feeding tube every 6 (six) hours as needed for mild pain (or Fever >/= 101).      Marland Kitchen acetaminophen (TYLENOL) 650 MG suppository Place 1 suppository (650 mg total) rectally every 6 (six) hours as needed for mild pain (or Fever >/= 101).  12 suppository  0  . Amino Acids-Protein Hydrolys (FEEDING SUPPLEMENT, PRO-STAT SUGAR FREE 64,) LIQD Place 30 mLs into feeding tube 2 (two)  times daily.  900 mL  0  . amiodarone (PACERONE) 200 MG tablet Place 1 tablet (200 mg total) into feeding tube 2 (two) times daily.      Marland Kitchen antiseptic oral rinse (BIOTENE) LIQD 15 mLs by Mouth Rinse route 2 times daily at 12 noon and 4 pm.      . aspirin 81 MG chewable tablet Place 1 tablet (81 mg total) into feeding tube daily.      . chlorhexidine (PERIDEX) 0.12 % solution 15 mLs by Mouth Rinse route 2 (two) times daily.  120 mL  0  . diltiazem (CARDIZEM) 10 mg/ml oral suspension Place 12 mLs (120 mg total) into feeding tube every 8 (eight) hours.      Marland Kitchen guaifenesin (ROBITUSSIN) 100 MG/5ML syrup Place 30 mLs (600 mg total) into feeding tube every 6 (six) hours as needed for cough or congestion.  120 mL  0  . metoprolol tartrate (LOPRESSOR) 25 mg/10 mL SUSP Place 10 mLs (25 mg total) into feeding tube 2 (two) times daily.      . Nutritional Supplements (FEEDING SUPPLEMENT, JEVITY 1.2 CAL,) LIQD Place 1,000 mLs into feeding tube continuous.    0  . ondansetron (ZOFRAN) 4 MG tablet Place 1 tablet (4 mg total) into feeding tube every 6 (six) hours as needed for nausea.  20 tablet  0  . oxyCODONE (ROXICODONE)  5 MG/5ML solution Place 5-10 mLs (5-10 mg total) into feeding tube every 4 (four) hours as needed for moderate pain or severe pain.  15 mL  0  . pantoprazole sodium (PROTONIX) 40 mg/20 mL PACK Place 20 mLs (40 mg total) into feeding tube daily.  30 each    . potassium chloride 20 MEQ/15ML (10%) solution Place 15 mLs (20 mEq total) into feeding tube 3 (three) times daily.  500 mL  0  . sorbitol 70 % SOLN Place 30 mLs into feeding tube daily as needed for moderate constipation.      . Valproic Acid (DEPAKENE) 250 MG/5ML SYRP syrup Place 15 mLs (750 mg total) into feeding tube 2 (two) times daily.  600 mL    . Water For Irrigation, Sterile (FREE WATER) SOLN Place 250 mLs into feeding tube every 6 (six) hours.       History:  obtained from child.  Family oriented to room, and floor completed with  information packet given to patient/family.  Patient declined safety video at this time.  Admission INP armband ID verified with patient/family, and in place.   SR up x 2, fall assessment complete, with patient and family able to verbalize understanding of risk associated with falls, and verbalized understanding to call nsg before up out of bed.  Call light within reach, patient able to voice, and demonstrate understanding.    Skin: stage two on buttock.  Will cont to eval and treat per MD orders.  Aldean Ast, RN 11/17/13 5:51 PM

## 2013-10-26 ENCOUNTER — Inpatient Hospital Stay (HOSPITAL_COMMUNITY): Payer: Medicare Other

## 2013-10-26 DIAGNOSIS — R569 Unspecified convulsions: Secondary | ICD-10-CM

## 2013-10-26 DIAGNOSIS — I259 Chronic ischemic heart disease, unspecified: Secondary | ICD-10-CM

## 2013-10-26 LAB — BLOOD GAS, ARTERIAL
Acid-base deficit: 1.4 mmol/L (ref 0.0–2.0)
Drawn by: 24513
O2 Content: 4 L/min
O2 Saturation: 91.9 %
Patient temperature: 98.6
pCO2 arterial: 32 mmHg — ABNORMAL LOW (ref 35.0–45.0)
pO2, Arterial: 63.8 mmHg — ABNORMAL LOW (ref 80.0–100.0)

## 2013-10-26 LAB — BASIC METABOLIC PANEL
BUN: 18 mg/dL (ref 6–23)
BUN: 17 mg/dL (ref 6–23)
CO2: 23 mEq/L (ref 19–32)
CO2: 22 mEq/L (ref 19–32)
Calcium: 7.4 mg/dL — ABNORMAL LOW (ref 8.4–10.5)
Chloride: 117 mEq/L — ABNORMAL HIGH (ref 96–112)
Creatinine, Ser: 0.93 mg/dL (ref 0.50–1.35)
GFR calc non Af Amer: 78 mL/min — ABNORMAL LOW (ref >90)
Glucose, Bld: 447 mg/dL — ABNORMAL HIGH (ref 70–99)
Glucose, Bld: 347 mg/dL — ABNORMAL HIGH (ref 70–99)
Potassium: 4.5 mEq/L (ref 3.5–5.1)
Potassium: 3.6 mEq/L (ref 3.5–5.1)
Sodium: 148 mEq/L — ABNORMAL HIGH (ref 135–145)

## 2013-10-26 LAB — CBC
HCT: 23.6 % — ABNORMAL LOW (ref 39.0–52.0)
Hemoglobin: 7.6 g/dL — ABNORMAL LOW (ref 13.0–17.0)
MCV: 89.4 fL (ref 78.0–100.0)
Platelets: 43 10*3/uL — ABNORMAL LOW (ref 150–400)
RBC: 2.64 MIL/uL — ABNORMAL LOW (ref 4.22–5.81)
RDW: 16.2 % — ABNORMAL HIGH (ref 11.5–15.5)
WBC: 9.1 10*3/uL (ref 4.0–10.5)

## 2013-10-26 LAB — URINALYSIS, ROUTINE W REFLEX MICROSCOPIC
Bilirubin Urine: NEGATIVE
Glucose, UA: >1000 mg/dL — AB
Hgb urine dipstick: NEGATIVE
Leukocytes, UA: NEGATIVE
Specific Gravity, Urine: 1.018 (ref 1.005–1.030)
pH: 5.5 (ref 5.0–8.0)

## 2013-10-26 LAB — URINE MICROSCOPIC-ADD ON

## 2013-10-26 LAB — GLUCOSE, CAPILLARY
Glucose-Capillary: 471 mg/dL — ABNORMAL HIGH (ref 70–99)
Glucose-Capillary: 478 mg/dL — ABNORMAL HIGH (ref 70–99)
Glucose-Capillary: 430 mg/dL — ABNORMAL HIGH (ref 70–99)

## 2013-10-26 LAB — HEMOGLOBIN A1C: Hgb A1c MFr Bld: 7.2 % — ABNORMAL HIGH (ref <5.7)

## 2013-10-26 MED ORDER — DEXTROSE 5 % IV SOLN
1.0000 g | Freq: Three times a day (TID) | INTRAVENOUS | Status: DC
  Administered 2013-10-26 – 2013-10-27 (×2): 1 g via INTRAVENOUS
  Filled 2013-10-26 (×4): qty 1

## 2013-10-26 MED ORDER — INSULIN ASPART 100 UNIT/ML ~~LOC~~ SOLN: 0.0000 [IU] | Freq: Three times a day (TID) | SUBCUTANEOUS | Status: DC

## 2013-10-26 MED ORDER — ATROPINE ORAL SOLUTION 0.08 MG/ML: 0.0800 mg | ORAL | Status: DC | PRN

## 2013-10-26 MED ORDER — GLUCERNA 1.2 CAL PO LIQD
1000.0000 mL | ORAL | Status: DC
  Administered 2013-10-26: 16:00:00 1000 mL
  Filled 2013-10-26 (×2): qty 1000

## 2013-10-26 MED ORDER — LORAZEPAM 2 MG/ML IJ SOLN: 1.0000 mg | INTRAMUSCULAR | Status: DC | PRN

## 2013-10-26 MED ORDER — FREE WATER
250.0000 mL | Status: DC
  Administered 2013-10-26 – 2013-10-27 (×4): 250 mL

## 2013-10-26 MED ORDER — ATROPINE SULFATE 1 % OP SOLN
2.0000 [drp] | OPHTHALMIC | Status: DC | PRN
  Filled 2013-10-26: qty 2

## 2013-10-26 MED ORDER — JEVITY 1.2 CAL PO LIQD
1000.0000 mL | ORAL | Status: DC
  Filled 2013-10-26 (×2): qty 1000

## 2013-10-26 MED ORDER — VANCOMYCIN HCL IN DEXTROSE 1-5 GM/200ML-% IV SOLN
1000.0000 mg | Freq: Two times a day (BID) | INTRAVENOUS | Status: DC
  Administered 2013-10-26 – 2013-10-27 (×2): 1000 mg via INTRAVENOUS
  Filled 2013-10-26 (×3): qty 200

## 2013-10-26 MED ORDER — SCOPOLAMINE 1 MG/3DAYS TD PT72
1.0000 | MEDICATED_PATCH | TRANSDERMAL | Status: DC
  Administered 2013-10-26: 1.5 mg via TRANSDERMAL
  Filled 2013-10-26: qty 1

## 2013-10-26 MED ORDER — SODIUM CHLORIDE 0.45 % IV SOLN
INTRAVENOUS | Status: DC
  Administered 2013-10-26: 14:00:00 via INTRAVENOUS

## 2013-10-26 MED ORDER — PRO-STAT SUGAR FREE PO LIQD: 30.0000 mL | Freq: Every day | ORAL | Status: DC

## 2013-10-26 MED ORDER — INSULIN ASPART 100 UNIT/ML ~~LOC~~ SOLN
0.0000 [IU] | SUBCUTANEOUS | Status: DC
Start: 2013-10-26 — End: 2013-10-27
  Administered 2013-10-26: 20 [IU] via SUBCUTANEOUS

## 2013-10-26 MED ORDER — MORPHINE SULFATE 2 MG/ML IJ SOLN
2.0000 mg | INTRAMUSCULAR | Status: DC | PRN
  Administered 2013-10-26 – 2013-10-27 (×2): 2 mg via INTRAVENOUS
  Filled 2013-10-26 (×2): qty 1

## 2013-10-26 MED ORDER — INSULIN ASPART 100 UNIT/ML ~~LOC~~ SOLN: 0.0000 [IU] | SUBCUTANEOUS | Status: DC

## 2013-10-26 MED ORDER — INSULIN ASPART 100 UNIT/ML ~~LOC~~ SOLN
25.0000 [IU] | Freq: Once | SUBCUTANEOUS | Status: AC
  Administered 2013-10-26: 25 [IU] via SUBCUTANEOUS

## 2013-10-26 NOTE — Progress Notes (Signed)
Notified Craige Cotta, NP that respiratory therapist, April states that ABG is normal. Patient's respirations are around 50 a minute. Craige Cotta, NP coming to bedside to assess patient. Will continue to monitor patient. Nelda Marseille, RN

## 2013-10-26 NOTE — Progress Notes (Signed)
Placed pt on nonrebreather mask per NP order. Will continue to monitor patient. Nelda Marseille, RN

## 2013-10-26 NOTE — Progress Notes (Addendum)
Brief HPI: Mr. Crays is a 79 yo AAM with a PMHx of multiple medical issues, including; recent hemorrhagic CVA with sequelae of right hemiparesis, aphasia and dysphagia s/p PEG; aspiration PNA, CAD/MI, DVT, Afib, CKD and anemia. Pt was recently hospitalized with stroke and d/c to SNF on 10/23/13. Since being at SNF, pt has continued to decline.  On 10/19/2013, he developed fever and was admitted with HCAP, suspected aspiration PNA.  Event: Tonight, around [place], RN paged this NP secondary to pt having increased WOB using accessory muscles and tachypnea. O2 saturation normal on [place]. NP to bedside. S: Pt can not participate in ROS secondary to mental status and he is non verbal and mostly unresponsive. Per RN, dayshift did not report any issues with respirations today.  O: Very poor appearing elderly AAM in respiratory distress as evidenced by increased WOB, use of accessory muscles, and tachypnea with RR in the 30s and up to the 50s on later exam. VS reviewed. Breath sounds diminished especially on the left. RRR. PEG site with clean and dry dressing. NEURO: opens his eyes for seconds to tactile stimulation. Non verbal. Doesn't follow commands. Right sided hemiparesis noted. Edema to RUE.  A/P: 1. Acute respiratory distress/failure-likely secondary to PNA and aspiration as well as pleural effusions. Stat CXR ordered confirming this. ABG is basically normal. BMP ordered. Son, Jewel Baize, came to hospital and NP spoke to him about his father's condition. I also spoke to the pt's wife on the telephone. After explanation of the respiratory failure and options for treatment, wife/son elected to not progress past NRB and to "keep him comfortable" due to this acute process coupled with the pt's recent stroke and overall decline in health. Placed pt on Morphine, Scopalamine, Atropine, and Ativan. Pt is now a DNR/DNI.  Jimmye Norman, NP [facility] Addendum: Over 400cc PEG residual. Will d/c feedings.

## 2013-10-26 NOTE — Progress Notes (Addendum)
Nutrition Brief Note  Please see full RD assessment completed by this RD today.  RD paged by Algis Downs, PA-C regarding patient's enteral nutrition regimen. Blood sugars are ranging from 346 - 471. Will switch to lower-carbohydrate containing formula, Glucerna 1.2, at this time in efforts to better control blood sugar.  INTERVENTION:  Discontinue Jevity 1.2.  Initiate Glucerna 1.2 at 35 ml/hr, advance by 10 ml q 4 hours, to goal of 65 ml/hr. Decrease Prostat to once daily. Continue free water of 250 ml q 4 hours. Above regimen will now provide: 1972 kcal, 109 grams protein, and 2756 ml free water daily. *This regimen will provide approximately 64 grams carbohydrate less daily than the previous regimen.* RD to continue to follow nutrition care plan.  Jarold Motto MS, RD, LDN Pager: [phone] After-hours pager: [phone]

## 2013-10-26 NOTE — Progress Notes (Addendum)
INITIAL NUTRITION ASSESSMENT  DOCUMENTATION CODES Per approved criteria  -Severe malnutrition in the context of chronic illness   INTERVENTION: Continue Jevity 1.2 at 60 ml/hr with 30 ml Prostat liquid protein via tube BID. This regimen provides: 1928 kcal, 110g protein, and 1167 mL free water.  Continue free water of 250 ml QID - this provides an additional 1 liter fluid daily. RD to continue to follow nutrition care plan.  NUTRITION DIAGNOSIS: Malnutrition related to inadequate oral intake as evidenced by severe depletion of muscle mass and subcutaneous fat mass.  Goal: Intake to meet >90% of estimated nutrition needs.  Monitor:  TF tolerance, weights, labs, GOC  Reason for Assessment: MD Consult for Initiation and Management of Enteral Nutrition + Malnutrition Screening Tool  79 y.o. male  Admitting Dx: PNA  ASSESSMENT: PMHx significant for recent hemorrhagic/CVA/R sided hemiparesis, aphasia, dysphasia s/p PEG (placed 11/4), h/o CAD/MI, DVT s/p IVC, a fib not on AC (due to hemorrhagic CVA), recent pneumonia discharged to [facility] 11/17. Admitted with fever. Work-up reveals R-sided PNA. Per son, pt is progressively declining.  Discussed during progression rounds. Pt is currently ordered for Jevity 1.2 at 60 ml/hr with 30 ml Prostat via PEG BID. RN reports that pt is currently tolerating this regimen well. This regimen provides: 1928 kcal, 110g protein, and 1167 mL free water. Receiving free water of 250 ml QID - this provides an additional 1 liter fluid daily.  Sodium is elevated at 148. CBG's ranging from 259-447.  Nutrition Focused Physical Exam:  Subcutaneous Fat:  Orbital Region: severe depletion Upper Arm Region: severe depletion Thoracic and Lumbar Region: severe depletion  Muscle:  Temple Region: severe depletion Clavicle Bone Region: severe depletion Clavicle and Acromion Bone Region: severe depletion Scapular Bone Region: severe depletion Dorsal Hand: severe  depletion Patellar Region: severe depletion Anterior Thigh Region: severe depletion Posterior Calf Region: severe depletion  Edema: none  Pt meets criteria for severe MALNUTRITION in the context of chronic illness as evidenced by severe depletion of muscle mass and subcutaneous fat mass.  Height: Ht Readings from Last 1 Encounters:  10/12/13 6\' 2"  (1.88 m)    Weight: Wt Readings from Last 1 Encounters:  10/26/13 158 lb 4.6 oz (71.8 kg)    Ideal Body Weight: 190 lb/86.4 kg  % Ideal Body Weight: 83%  Wt Readings from Last 10 Encounters:  10/26/13 158 lb 4.6 oz (71.8 kg)  10/23/13 167 lb 4.8 oz (75.887 kg)  08/07/13 152 lb 5.4 oz (69.1 kg)  04/05/13 159 lb (72.122 kg)  03/29/13 155 lb (70.308 kg)  09/28/12 151 lb (68.493 kg)  03/29/12 157 lb 1.6 oz (71.26 kg)  09/25/11 164 lb (74.39 kg)  05/20/11 166 lb (75.297 kg)    Usual Body Weight: 152 lb (2 months ago)  % Usual Body Weight: 104%  BMI:  Body mass index is 20.31 kg/(m^2). Normal weight  Estimated Nutritional Needs: Kcal: 1900-2100 Protein: 100-120 gm Fluid: 1.9-2.1 L  Skin: R abdomen incision  Diet Order:   NPO  EDUCATION NEEDS: -Education not appropriate at this time   Intake/Output Summary (Last 24 hours) at 10/26/13 1011 Last data filed at 10/26/13 0500  Gross per 24 hour  Intake    330 ml  Output   1500 ml  Net  -1170 ml    Last BM: 11/17   Labs:   Recent Labs Lab 10/23/13 0636 10/26/2013 2045 10/26/13 0600  NA 147* 147* 148*  K 3.7 4.2 4.5  CL 113* 116* 117*  CO2 26 23 23   BUN 15 19 18   CREATININE 0.86 1.05 0.93  CALCIUM 7.2* 7.1* 7.2*  GLUCOSE 258* 346* 447*    CBG (last 3)   Recent Labs  10/23/13 1208  GLUCAP 259*    Scheduled Meds: . amiodarone  200 mg Per Tube BID  . antiseptic oral rinse  15 mL Mouth Rinse q12n4p  . ceFEPime (MAXIPIME) IV  1 g Intravenous Q12H  . chlorhexidine  15 mL Mouth Rinse BID  . diltiazem  120 mg Per Tube Q8H  . feeding supplement  (PRO-STAT SUGAR FREE 64)  30 mL Per Tube BID  . free water  250 mL Per Tube Q6H  . metoprolol tartrate  25 mg Per Tube BID  . pantoprazole sodium  40 mg Per Tube Daily  . potassium chloride  20 mEq Per Tube TID  . sodium chloride  3 mL Intravenous Q12H  . Valproic Acid  750 mg Per Tube BID  . vancomycin  1,000 mg Intravenous Q8H    Continuous Infusions: . sodium chloride 100 mL/hr at 10/26/13 0438  . feeding supplement (JEVITY 1.2 CAL) 1,000 mL (10/26/13 0547)    Past Medical History  Diagnosis Date  . Heart disease   . Hypertension   . Iron deficiency anemia 06/12/2011  . CAD (coronary artery disease)   . Noncompliance   . ED (erectile dysfunction)   . IFG (impaired fasting glucose)   . Lacunar stroke   . Myocardial infarction 1980's X2  . Prostate cancer 1990's    "took radiation tx for that" (10/11/2013)  . Adenocarcinoma of colon 06/12/2011  . Pneumonia     "maybe now, never before" (10/11/2013)  . Stroke 08/06/2013    "paralyzed on the right side since" (10/11/2013)  . Arthritis     "probably; joints" (10/11/2013)  . Chronic kidney disease     "one kidney's smaller than the other; been like that forever" (10/11/2013)  . Seizures     Past Surgical History  Procedure Laterality Date  . Colonoscopy    . Colectomy  ~ 2011  . Cataract extraction Right   . Ankle fracture surgery Left 1990's    "put pins in it" (10/11/2013)  . Prostate surgery      Jarold Motto MS, RD, LDN Pager: [phone] After-hours pager: [phone]

## 2013-10-26 NOTE — Progress Notes (Signed)
D/c tube feeds per NP order. Patient had 420cc of tube feeding residual per PEG tube. Returned tube feed residual back through PEG tube. Will continue to monitor patient. Nelda Marseille, RN

## 2013-10-26 NOTE — Progress Notes (Signed)
Palliative Medicine Team consult for goals of care received; attempted to contact son [name] [phone] left message then contacted and spoke with patient's wife, Francisco Johnston [phone]) briefly- she put her son [name] on the phone- [name] stated his mother is not in good health and will not be able to come to the hospital -offered to speak with him and have [name] on the phone -[name] stated he spoke with Algis Downs earlier and felt he did not need to speak with anyone else -'we understand what's going on' he stated he did not want to meet with PMT at this time and if he changed his mind he would let the doctor know- PMT will sign off; please re-consult if we can be of assistnace in the future   Valente David, RN 10/26/2013, 5:45 PM Palliative Medicine Team RN Liaison [phone]

## 2013-10-26 NOTE — Progress Notes (Signed)
[facility] PROGRESS NOTE  CADIN LUKA WUJ:[id number] DOB: 12-22-1933 DOA: October 28, 2013 PCP: [name], MD  Assessment/Plan:  Francisco Johnston is a 79 y.o. male with PMH of recent hemorrhagic/CVA/R sided hemiparesis, aphasia, dysphasia s/p PEG, h/o CAD/MI, DVT s/p IVC, a fib not on AC (due to hemorrhagic CVA), recent pneumonia discharged to [facility] 10/23/13.  Presented to [facility] ED with episode of fever 101.3; CXR showed R sided pneumonia; patient could not provide any history due to aphasia/CVA; per son: patient is still progressively declining, had fever at nursing facility; no nausea, vomiting, no diarrhea  HCAP/sepsis; suspicious for aspiration  Recent admission completed Vanc/Zosyn - last dose 11/14 for pneumonia  Blood cultures pending  Vanc / Cefepime started on admission  Nebs, Oxygen  Discussed probability of recurrent aspiration pneumonia with son [name].    Son open to Palliative consult either inpatient or at SNF (when his mother/patient's wife can attend).  Failure to thrive / functional decline  Patient unable to speak.  Occasionally mumbles.  Severe protein calorie malnutrition   Appreciate consult nutrition; cont s/p PEG   PAF; cont amiodarone, metoprolol, cardizem   echo (10/12/13) LVEF of 55-60%, normal wall motion   not a Candidate for anticoagulation due to recent hemorrhagic CVA  History of seizures   post recent hemorrhagic CVA with chronic right-sided hemiparesis, aphasia, and dysphagia   continue valproic acid   R Leg DVT (hemiparesis on the right side);   Very high risk for repeat bleed on full anticoagulation   IVC filter placed by IR 11/12   Thrombocytopenia of unclear etiology  Platelets were 150 in early november  ? MDS vs acute illness/sepsis   no s/s of acute bleeding  Anemia chronic  Hg 7.5 per ED labs   no s/s of acute bleeding  repeat Hg; Tf < 7.0   AKI on CKDII  dehyrdation/hyper Na  Change to 0.45 normal  saline and increase free water.      DVT Prophylaxis:  SCDs  Code Status: Partial Code.  DNI, but willing to have ACLS. Family Communication: Talked with Grace Haggart Son [phone].  He is open to palliative consult either inpatient or at SNF when ever it is convenient for his mother. Disposition Plan: inpatient  Antibiotics: vanc / zosyn  HPI/Subjective: Patient unable to communicate  Objective: Filed Vitals:   10/26/13 0443 10/26/13 0500 10/26/13 1100 10/26/13 1348  BP: 117/57 133/75  138/85  Pulse: 70   74  Temp:  97.4 F (36.3 C)  98.8 F (37.1 C)  TempSrc:  Axillary  Axillary  Resp:  18  20  Height:   6\' 2"  (1.88 m)   Weight:   71.799 kg (158 lb 4.6 oz)   SpO2:    98%    Intake/Output Summary (Last 24 hours) at 10/26/13 1419 Last data filed at 10/26/13 1349  Gross per 24 hour  Intake    330 ml  Output   2800 ml  Net  -2470 ml   Filed Weights   10/26/13 0020 10/26/13 1100  Weight: 71.8 kg (158 lb 4.6 oz) 71.799 kg (158 lb 4.6 oz)    Exam:   General:  Opens eyes, groans, unable to follow commands  Cardiovascular: RRR, no murmurs, rubs or gallops, no lower extremity edema  Respiratory: no deep inspiration, CTA, no wheeze, crackles, or rales.  No increased work of breathing.  Abdomen: Soft, non-tender, non-distended, + bowel sounds, no masses  Musculoskeletal: does not follow commands.  Extremities becoming  contracted.  Data Reviewed: Basic Metabolic Panel:  Recent Labs Lab 10/21/13 0421 10/22/13 0650 10/23/13 0636 10/30/2013 2045 10/26/13 0600  NA 148* 149* 147* 147* 148*  K 4.0 3.4* 3.7 4.2 4.5  CL 115* 115* 113* 116* 117*  CO2 28 25 26 23 23   GLUCOSE 109* 190* 258* 346* 447*  BUN 13 14 15 19 18   CREATININE 0.77 0.84 0.86 1.05 0.93  CALCIUM 7.5* 7.4* 7.2* 7.1* 7.2*   Liver Function Tests:  Recent Labs Lab 10/16/2013 2045  AST 15  ALT 8  ALKPHOS 71  BILITOT 0.4  PROT 6.1  ALBUMIN 0.8*   CBC:  Recent Labs Lab 10/21/13 0421  10/22/13 0650 10/23/13 0636 10/08/2013 2045 10/26/13 0600  WBC 12.9* 12.0* 11.9* 10.9* 9.1  HGB 8.5* 9.6* 8.8* 7.5* 7.6*  HCT 25.1* 28.7* 26.8* 23.1* 23.6*  MCV 87.2 87.8 87.0 89.5 89.4  PLT 61* 71* 44* 48* 43*   CBG:  Recent Labs Lab 10/23/13 0008 10/23/13 0352 10/23/13 0824 10/23/13 1208 10/26/13 1134  GLUCAP 242* 206* 230* 259* 471*       Studies: Dg Chest Port 1 View  10/18/2013   CLINICAL DATA:  Fever  EXAM: PORTABLE CHEST - 1 VIEW  COMPARISON:  10/30/2013  FINDINGS: Normal cardiac silhouette. Opacification of the left hemi thorax is again demonstrated. There is bullous change at the left lung apex. There bilateral moderate effusions which are slightly increased compared to prior. Right upper lobe is clear.  IMPRESSION: 1. Increasing bilateral effusions. 2. Stable opacification of the left hemi thorax.   Electronically Signed   By: Genevive Bi M.D.   On: 11/05/2013 18:03    Scheduled Meds: . amiodarone  200 mg Per Tube BID  . antiseptic oral rinse  15 mL Mouth Rinse q12n4p  . ceFEPime (MAXIPIME) IV  1 g Intravenous Q8H  . chlorhexidine  15 mL Mouth Rinse BID  . diltiazem  120 mg Per Tube Q8H  . feeding supplement (PRO-STAT SUGAR FREE 64)  30 mL Per Tube BID  . free water  250 mL Per Tube Q4H  . metoprolol tartrate  25 mg Per Tube BID  . pantoprazole sodium  40 mg Per Tube Daily  . potassium chloride  20 mEq Per Tube TID  . sodium chloride  3 mL Intravenous Q12H  . Valproic Acid  750 mg Per Tube BID  . vancomycin  1,000 mg Intravenous Q12H   Continuous Infusions: . sodium chloride    . feeding supplement (JEVITY 1.2 CAL) 1,000 mL (10/26/13 1130)    Principal Problem:   HCAP (healthcare-associated pneumonia) Active Problems:   HTN (hypertension)   CVA (cerebral vascular accident)   ICH (intracerebral hemorrhage)   Convulsions/seizures   Hemiparesis affecting right side as late effect of cerebrovascular accident    [place] [facility] Pager [phone]. If 7PM-7AM, please contact night-coverage at [email or web address], password [facility] 10/26/2013, 2:19 PM  LOS: 1 day   Attending Suspected Aspiration PNA-await blood cultures. Continue with Vanco/Cefepime. Will follow clinical course. Very poor overall prognoses. Will speak with family 11/21, to see if we can define goals of care further. Palliative input noted.  S Jatoria Kneeland

## 2013-10-26 NOTE — Progress Notes (Signed)
Called to patient room for ABG to be done for patient with increased respiratory rate. Patient seemed to be breathing at a rate of 35-45 BPM with no distress noted. Good air into and out of lungs, Clear with some slight rhonchi. Patient appeared to be Kussmaul's breathing. RN aware of my interpretation and ABG done.  Will continue to monitor the situation. ABG    Component Value Date/Time   PHART 7.453* 10/26/2013 2042   PCO2ART 32.0* 10/26/2013 2042   PO2ART 63.8* 10/26/2013 2042   HCO3 22.0 10/26/2013 2042   TCO2 23.0 10/26/2013 2042   ACIDBASEDEF 1.4 10/26/2013 2042   O2SAT 91.9 10/26/2013 2042

## 2013-10-26 NOTE — Progress Notes (Signed)
Notified Craige Cotta, NP that patient's CBG is 430. Bufford Spikes, NP that patient is also breathing rapidly and using accessory muscles to breath. Will continue to monitor patient. Jerrye Bushy

## 2013-10-26 NOTE — Progress Notes (Addendum)
Inpatient Diabetes Program Recommendations  AACE/ADA: New Consensus Statement on Inpatient Glycemic Control (2013)  Target Ranges:  Prepandial:   less than 140 mg/dL      Peak postprandial:   less than 180 mg/dL (1-2 hours)      Critically ill patients:  140 - 180 mg/dL    Results for ZAKARIYAH, FREIMARK (MRN [id number]) as of 10/26/2013 11:45  Ref. Range 10/23/2013 06:36 03-Nov-2013 20:45 10/26/2013 06:00  Glucose Latest Range: 70-99 mg/dL 147 (H) 829 (H) 562 (H)    **Elevated lab glucose readings X3 results.  Patient currently on Jevity tube feeds at 60cc/hour.  No history of DM mentioned in PMH.   **MD- Please consider checking CBGs and cover with Novolog Sensitive correction scale (SSI) Q4 hours if elevated.   Will follow. Ambrose Finland RN, MSN, CDE Diabetes Coordinator Inpatient Diabetes Program Team Pager: [phone] (8a-10p)

## 2013-10-27 DIAGNOSIS — I619 Nontraumatic intracerebral hemorrhage, unspecified: Secondary | ICD-10-CM

## 2013-10-27 LAB — GLUCOSE, CAPILLARY: Glucose-Capillary: 161 mg/dL — ABNORMAL HIGH (ref 70–99)

## 2013-10-27 MED ORDER — MORPHINE SULFATE 2 MG/ML IJ SOLN
2.0000 mg | Freq: Four times a day (QID) | INTRAMUSCULAR | Status: DC
  Administered 2013-10-27: 2 mg via INTRAVENOUS
  Filled 2013-10-27: qty 1

## 2013-10-27 MED ORDER — FREE WATER: 400.0000 mL | Status: DC

## 2013-10-31 LAB — CULTURE, BLOOD (ROUTINE X 2): Culture: NO GROWTH

## 2013-11-01 LAB — CULTURE, BLOOD (ROUTINE X 2): Culture: NO GROWTH

## 2013-11-01 LAB — HEPARIN INDUCED THROMBOCYTOPENIA PNL
UFH Low Dose 0.1 IU/mL: 0 % Release
UFH SRA Result: NEGATIVE

## 2013-11-06 NOTE — Discharge Summary (Signed)
Death Summary  Francisco Johnston EAV:[id number] DOB: 30-Mar-1934 DOA: 18-Nov-2013  PCP: [name], MD  Admit date: 2013/11/18 Date of Death: 11/20/13 Time of death:1520 hrs  Final Diagnoses:  Principal Problem: Acute respiratory failure  Active Problems:   HCAP/Aspiration PNA   HTN (hypertension)   CVA (cerebral vascular accident)   ICH (intracerebral hemorrhage)   Convulsions/seizures   Hemiparesis affecting right side as late effect of cerebrovascular accident   Failure to thrive   Paroxysmal atrial fibrillation    History of a right leg DVT   Chronic thrombocytopenia   Anemia of chronic disease  History of present illness:  Francisco Johnston was a 79 y.o. male with PMH of recent hemorrhagic/CVA/R sided hemiparesis, aphasia, dysphasia s/p PEG, h/o CAD/MI, DVT s/p IVC, a fib not on AC (due to hemorrhagic CVA), recent pneumonia discharged to [facility] 10/23/13 presented to [facility] ED with episode of fever 101.3; CXR showed R sided pneumonia; patient could not provide any history due to aphasia/CVA; per son: patient is still progressively declining, had fever at nursing facility; no nausea, vomiting, no diarrhea. Patient was then admitted for further evaluation and treatment.  Hospital Course:  Acute respiratory failure  - Secondary to aspiration pneumonia  - Unfortunately worsened on 11/20 evening, after discussion with family patient was place on a 100% nonrebreather mask, we decided not to escalate care beyond NRB, given very poor overall prognosis.  - Long discussion with the patient's family members by me on 11/21-spouse, son's were present, I explained that the patient's condition was terminal, I explained that the patient has severe aspiration and is not able to control secretions. Failure to thrive pathophysiology was also discussed, he didn't elected to continue with just comfort measures, subsequently all antibiotics, blood work while discontinued. Patient was placed on morphine  and Ativan for comfort. He was also placed on scopolamine transdermal E., and atropine sublingually patient continued to deteriorate rather quickly, and subsequently passed on 11/20/76 at 1520 hours.Marland Kitchen    HCAP/Aspiration PNA  - Patient's clinical features of a consistent with aspiration pneumonia, he was just recently discharged and readmitted with worsening x-ray findings and fever. Initially on admission he was started on vancomycin and cefepime, also provided with supportive care, after discussion with family on Dec 15, 77, all antibiotics were discontinued and patientwas transitioned to full comfort care status.   Failure to thrive / functional decline    History of paroxysmal atrial fibrillation  - Initially patient was continued on amiodarone, metoprolol and Cardizem. Since we have now transitioned to comfort care status, all of his medications have been discontinued. He was not a candidate for anticoagulation given history of recent hemorrhagic CVA   History of seizure  - Was continued on Depakote for comfort.   History of right leg DVT  -Was Not a candidate for anticoagulation. Had a IVC filter in place   History of recent hemorrhagic CVA with significant right sided hemiplegia, dysphagia and aphasia   History of chronic thrombocytopenia   History of anemia of chronic disease   Ethics/goals of care  -Goals of care discussion done by this physician on November 21, 2023 with multiple family members  - DO NOT RESUSCITATE  - No further lab work  - Full comfort care status  - Stop all antibiotics  - Stop peg feeding  - Plan was to Transfer to residential hospice when bed available  - Scheduled morphine every 6 hours, as needed morphine and Ativan  - Started on scopolamine transdermal, and atropine sublingually  Family aware of terminal status, and very short life expectancy.   SignedJeoffrey Massed  [facility] 11/05/2013, 4:20 PM

## 2013-11-06 NOTE — Progress Notes (Signed)
Informed by NT that was unable to get BP.  RN came in to check manually, was unable to get.  Noted that pt. Was not breathing, listened for apical pulse, none heard.  Asked Ginger, charge nurse to come in to assess.  She verified that pt. Was breathless and pulseless.  Forbes Cellar, RN

## 2013-11-06 NOTE — Progress Notes (Signed)
"   PATIENT DETAILS Name: Francisco Johnston Age: 79 y.o. Sex: male Date of Birth: 02/07/1934 Admit Date: 10/08/2013 Admitting Physician Esperanza Sheets, MD EAV:[id number],B S, MD  Subjective: Continues to deteriorate, lethargic, pooling secretion. Events of last night noted  Assessment/Plan: Principal Problem: Acute respiratory failure - Secondary to aspiration pneumonia - Unfortunately worsened on 11/20 evening, after discussion with family now on 100% nonrebreather mask, very frail, activity pooling secretions and gurgling - Long discussion with the patient's family members by me on 11/21-spouse, son's were present, I explained that the patient's condition was terminal, I explained that the patient has severe aspiration and is not able to control secretions. Failure to thrive pathophysiology was also discussed, at this time we have elected to transition to full comfort measures. All antibiotics have now been discontinued.  HCAP/Aspiration PNA - Patient's clinical features of a consistent with aspiration pneumonia, he was just recently discharged and readmitted with worsening x-ray findings and fever. Initially on admission he was started on vancomycin and cefepime, also provided with supportive care, after discussion with family on 11/21, all antibiotics have been discontinued and patient is now full comfort care status.  Failure to thrive / functional decline - Patient deteriorating, very poor quality of life, significant functional decline, repeated hospitalization. Long discussion with family, now comfort care status.  History of paroxysmal atrial fibrillation - Initially patient was continued on amiodarone, metoprolol and Cardizem. Since we have now transitioned to comfort care status, all of his medications have been discontinued. He was not a candidate for anticoagulation given history of recent hemorrhagic CVA  History of seizure - We will continue Depakote for  comfort.  History of right leg DVT - Not a candidate for anticoagulation. 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  Disposition: Remain inpatient- transfer to residential hospice when bed available  DVT Prophylaxis:  SCD's  Code Status:  DNR  Family Communication 2 sons, spouse at bedside  Procedures:  None  CONSULTS:  None   MEDICATIONS: Scheduled Meds: .  morphine injection  2 mg Intravenous Q6H  . scopolamine  1 patch Transdermal Q72H  . Valproic Acid  750 mg Per Tube BID   Continuous Infusions: . sodium chloride 10 mL/hr at 10/26/13 1804   PRN Meds:.acetaminophen, acetaminophen, albuterol, atropine, LORazepam, morphine injection  Antibiotics: Anti-infectives   Start     Dose/Rate Route Frequency Ordered Stop   10/26/13 2000  ceFEPIme (MAXIPIME) 1 g in dextrose 5 % 50 mL IVPB  Status:  Discontinued     1 g 100 mL/hr over 30 Minutes Intravenous Every 8 hours 10/26/13 1358 09-Nov-2013 1040   10/26/13 1800  vancomycin (VANCOCIN) IVPB 1000 mg/200 mL premix  Status:  Discontinued     1,000 mg 200 mL/hr over 60 Minutes Intravenous Every 12 hours 10/26/13 1114 11/09/13 1040   10/16/2013 2200  ceFEPIme (MAXIPIME) 1 g in dextrose 5 %  50 mL IVPB  Status:  Discontinued     1 g 100 mL/hr over 30 Minutes Intravenous Every 12 hours 10/24/2013 1652  10/26/13 1358   10/22/2013 2130  vancomycin (VANCOCIN) IVPB 1000 mg/200 mL premix  Status:  Discontinued     1,000 mg 200 mL/hr over 60 Minutes Intravenous Every 8 hours 10/23/2013 1652 10/26/13 1114       PHYSICAL EXAM: Vital signs in last 24 hours: Filed Vitals:   10/26/13 2048 10/26/13 2146 11/06/13 0456 06-Nov-2013 0620  BP:  105/60  115/61  Pulse:  78  66  Temp: 98.6 F (37 C)   99 F (37.2 C)  TempSrc: Axillary   Axillary  Resp:  36 24 22  Height:      Weight:      SpO2:  100%  97%    Weight change: -0.001 kg (-0 oz) Filed Weights   10/26/13 0020 10/26/13 1100  Weight: 71.8 kg (158 lb 4.6 oz) 71.799 kg (158 lb 4.6 oz)   Body mass index is 20.31 kg/(m^2).   Gen Exam: Lethargic, and sedated Neck: Supple, No JVD.  Chest: His air entry in the bilateral bases, transmitted upper airway sounds, coarse breath sounds all over. CVS: S1 S2 Regular Abdomen: soft, BS +, non tender, non distended. PEG tube in place Extremities: 2+ edema, lower extremities warm to touch. Neurologic: Dense right-sided hemiplegia   Intake/Output from previous day:  Intake/Output Summary (Last 24 hours) at 2013-11-06 1149 Last data filed at 11-06-2013 0715  Gross per 24 hour  Intake 1376.17 ml  Output   2150 ml  Net -773.83 ml     LAB RESULTS: CBC  Recent Labs Lab 10/21/13 0421 10/22/13 0650 10/23/13 0636 11/02/2013 2045 10/26/13 0600  WBC 12.9* 12.0* 11.9* 10.9* 9.1  HGB 8.5* 9.6* 8.8* 7.5* 7.6*  HCT 25.1* 28.7* 26.8* 23.1* 23.6*  PLT 61* 71* 44* 48* 43*  MCV 87.2 87.8 87.0 89.5 89.4  MCH 29.5 29.4 28.6 29.1 28.8  MCHC 33.9 33.4 32.8 32.5 32.2  RDW 15.8* 15.7* 15.7* 16.2* 16.2*    Chemistries   Recent Labs Lab 10/22/13 0650 10/23/13 0636 10/22/2013 2045 10/26/13 0600 10/26/13 2041  NA 149* 147* 147* 148* 149*  K 3.4* 3.7 4.2 4.5 3.6  CL 115* 113* 116* 117* 120*  CO2 25 26 23 23 22   GLUCOSE 190* 258* 346* 447* 347*  BUN 14 15 19 18 17   CREATININE 0.84 0.86 1.05 0.93 0.97   CALCIUM 7.4* 7.2* 7.1* 7.2* 7.4*    CBG:  Recent Labs Lab 10/23/13 1208 10/26/13 1134 10/26/13 1626 10/26/13 1916 11-06-13 0026  GLUCAP 259* 471* 478* 430* 161*    GFR Estimated Creatinine Clearance: 65.8 ml/min (by C-G formula based on Cr of 0.97).  Coagulation profile No results found for this basename: INR, PROTIME,  in the last 168 hours  Cardiac Enzymes No results found for this basename: CK, CKMB, TROPONINI, MYOGLOBIN,  in the last 168 hours  No components found with this basename: POCBNP,  No results found for this basename: DDIMER,  in the last 72 hours  Recent Labs  10/26/13 1815  HGBA1C 7.2*   No results found for this basename: CHOL, HDL, LDLCALC, TRIG, CHOLHDL, LDLDIRECT,  in the last 72 hours No results found for this basename: TSH, T4TOTAL, FREET3, T3FREE, THYROIDAB,  in the last 72 hours No results found for this basename: VITAMINB12, FOLATE, FERRITIN, TIBC, IRON, RETICCTPCT,  in the last 72 hours No results found for this  basename: LIPASE, AMYLASE,  in the last 72 hours  Urine Studies No results found for this basename: UACOL, UAPR, USPG, UPH, UTP, UGL, UKET, UBIL, UHGB, UNIT, UROB, ULEU, UEPI, UWBC, URBC, UBAC, CAST, CRYS, UCOM, BILUA,  in the last 72 hours  MICROBIOLOGY: Recent Results (from the past 240 hour(s))  CULTURE, BLOOD (ROUTINE X 2)     Status: None   Collection Time    11/10/2013  8:30 PM      Result Value Range Status   Specimen Description BLOOD LEFT HAND   Final   Special Requests BOTTLES DRAWN AEROBIC ONLY 3CC   Final   Culture  Setup Time     Final   Value: 10/26/2013 01:11     Performed at [facility]   Culture     Final   Value:        BLOOD CULTURE RECEIVED NO GROWTH TO DATE CULTURE WILL BE HELD FOR 5 DAYS BEFORE ISSUING A FINAL NEGATIVE REPORT     Performed at [facility]   Report Status PENDING   Incomplete  CULTURE, BLOOD (ROUTINE X 2)     Status: None   Collection Time    2013-11-10  8:39 PM      Result  Value Range Status   Specimen Description BLOOD LEFT FOREARM   Final   Special Requests BOTTLES DRAWN AEROBIC ONLY 2CC   Final   Culture  Setup Time     Final   Value: 10/26/2013 01:11     Performed at [facility]   Culture     Final   Value:        BLOOD CULTURE RECEIVED NO GROWTH TO DATE CULTURE WILL BE HELD FOR 5 DAYS BEFORE ISSUING A FINAL NEGATIVE REPORT     Performed at [facility]   Report Status PENDING   Incomplete    RADIOLOGY STUDIES/RESULTS: Ct Abdomen Wo Contrast  10/18/2013   CLINICAL DATA:  Evaluate anatomy for GJ tube.  EXAM: CT ABDOMEN WITHOUT CONTRAST  TECHNIQUE: Multidetector CT imaging of the abdomen was performed following the standard protocol without IV contrast.  COMPARISON:  CT 10/11/2012  FINDINGS: There are small to moderate bilateral pleural effusions, left larger than right. Compressive atelectasis or infiltrates in the lower lobes. Heart is borderline in size.  NG tube is present in the stomach with the tip in the region of the antrum. The stomach is decompressed distally, partially distended proximally. There is gaseous distention of the colon which overlies much of the stomach. Visualized small bowel is unremarkable.  IVC filter is in place.  Aorta is normal caliber.  Layering gallstones within the gallbladder. Liver, spleen, pancreas, adrenals and kidneys have an unremarkable unenhanced appearance. Trace ascites in the right paracolic gutter and inferior to the right liver.  Degenerative changes in the thoracolumbar spine, most pronounced in the lower lumbar spine.  IMPRESSION: NG tube present within the stomach with the tip in the antrum.  Gaseous distention of the transverse colon which overlies much of the stomach.  Trace ascites in the right abdomen.  Cholelithiasis.   Electronically Signed   By: Charlett Nose M.D.   On: 10/18/2013 15:29   Dg Chest 2 View  10/11/2013   CLINICAL DATA:  Altered mental status.  EXAM: CHEST  2 VIEW  COMPARISON:   08/08/2013.  FINDINGS: There is left upper lobe airspace disease. There is no pleural effusion or pneumothorax. The heart size is normal. The osseous structures are  unremarkable.  IMPRESSION: Left upper lobe pneumonia. Recommend followup radiography in 4-6 weeks, to document complete resolution following adequate medical therapy. If there is not complete resolution, then recommend further evaluation with CT of the chest to exclude underlying pathology.   Electronically Signed   By: Elige Ko   On: 10/11/2013 15:06   Dg Abd 1 View  10/18/2013   CLINICAL DATA:  NG placement  EXAM: ABDOMEN - 1 VIEW  COMPARISON:  10/14/2013  FINDINGS: NG tube tip in the antrum of the stomach. The stomach is mildly distended contains retained food.  Adynamic ileus with dilated right colon. Mild small bowel distention.  IMPRESSION: NG tube tip in the gastric antrum.  Ileus   Electronically Signed   By: Marlan Palau M.D.   On: 10/18/2013 08:26   Ct Head Wo Contrast  10/11/2013   CLINICAL DATA:  Slurred speech. Altered mental status.  EXAM: CT HEAD WITHOUT CONTRAST  TECHNIQUE: Contiguous axial images were obtained from the base of the skull through the vertex without intravenous contrast.  COMPARISON:  CT 08/08/2013  FINDINGS: Acute hemorrhage in the left parietal white matter on the prior study has resolved. There is a 2 cm hypodensity in this area. No acute hemorrhage.  Moderate to advanced atrophy. Negative for hydrocephalus. Chronic microvascular ischemic change in the white matter. Negative for acute infarct or mass.  IMPRESSION: Left parietal white matter hypodensity consistent with resolving hematoma compared with the prior study.  Extensive atrophy which has progressed from the prior study. No acute infarct or hemorrhage.   Electronically Signed   By: Marlan Palau M.D.   On: 10/11/2013 16:40   Ir Gastrostomy Tube Mod Sed  10/20/2013   CLINICAL DATA:  RECURRENT ASPIRATION, DYSPHAGIA  EXAM: FLUOROSCOPIC PULL THROUGH  20 FRENCH GASTROSTOMY  Date:  11/14/201411/14/2014 12:37 PM  Radiologist:  Judie Petit. Ruel Favors, MD  Guidance:  FLUOROSCOPIC  MEDICATIONS AND MEDICAL HISTORY: 1 g Ancefadministered within 1 hour of the procedure,1 mg Versed, 25 mcg fentanyl  ANESTHESIA/SEDATION: 15 min  CONTRAST:  20 CC OMNIPAQUE 300  FLUOROSCOPY TIME:  6 min 12 seconds  PROCEDURE: Informed consent was obtained from the patient following explanation of the procedure, risks, benefits and alternatives. The patient understands, agrees and consents for the procedure. All questions were addressed. A time out was performed.  Maximal barrier sterile technique utilized including caps, mask, sterile gowns, sterile gloves, large sterile drape, hand hygiene, and betadine prep.  The left upper quadrant was sterilely prepped and draped. An oral gastric catheter was inserted into the stomach under fluoroscopy. The existing nasogastric feeding tube was removed. Air was injected into the stomach for insufflation and visualization under fluoroscopy. The air distended stomach was confirmed beneath the anterior abdominal wall in the frontal and lateral projections. Under sterile conditions and local anesthesia, a 17 gauge trocar needle was utilized to access the stomach percutaneously beneath the left subcostal margin. Needle position was confirmed within the stomach under biplane fluoroscopy. Contrast injection confirmed position also. A single T tack was deployed for gastropexy. Over an Amplatz guide wire, a 9-French sheath was inserted into the stomach. A snare device was utilized to capture the oral gastric catheter. The snare device was pulled retrograde from the stomach up the esophagus and out the oropharynx. The 20-French pull-through gastrostomy was connected to the snare device and pulled antegrade through the oropharynx down the esophagus into the stomach and then through the percutaneous tract external to the patient. The gastrostomy was assembled externally.  Contrast injection confirms position in the stomach. Images were obtained for documentation. The patient tolerated procedure well. No immediate complication.  COMPLICATIONS: NO IMMEDIATE  IMPRESSION: Fluoroscopic insertion of a 20-French "pull-through" gastrostomy.   Electronically Signed   By: Ruel Favors M.D.   On: 10/20/2013 13:53   Ir Ivc Filter Plmt / S&i /img Guid/mod Sed  10/18/2013   INDICATION: 79 year old male with history of recent hemorrhagic stroke and now acute lower extremity DVT. With a contraindication to anticoagulation, he requires caval interruption for PE prophylaxis.  EXAM: ULTRASOUND GUIDANCE FOR VASCULAR ACCESS  IVC CATHETERIZATION AND VENOGRAM  IVC FILTER INSERTION  MEDICATIONS: Fentanyl 1 mcg IV; Versed 50 mg IV  ANESTHESIA/SEDATION: Sedation Time  10 minutes  CONTRAST:  50mL OMNIPAQUE IOHEXOL 300 MG/ML  SOLN  COMPARISON:  CT head 08/07/2013; CT abdomen/ pelvis 10/11/2012  FLUOROSCOPY TIME:  30 seconds  PROCEDURE: Informed consent was obtained from the patient following explanation of the procedure, risks, benefits and alternatives. The patient understands, agrees and consents for the procedure. All questions were addressed. A time out was performed prior to the initiation of the procedure.  Maximal barrier sterile technique utilized including caps, mask, sterile gowns, sterile gloves, large sterile drape, hand hygiene, and Betadine prep.  Under sterile condition and local anesthesia, right internal jugular venous access was performed with ultrasound. An ultrasound image was saved and sent to PACS. Over a guidewire, the IVC filter delivery sheath and inner dilator were advanced into the IVC just above the IVC bifurcation. Contrast injection was performed for an IVC venogram.  Through the delivery sheath, a retrievable Denali IVC filter was deployed below the level of the renal veins and above the IVC bifurcation. Limited post deployment venacavagram was performed.  The delivery sheath  was removed and hemostasis was obtained with manual compression. A dressing was placed. The patient tolerated the procedure well without immediate post procedural complication.  COMPLICATIONS: None immediate  FINDINGS: The IVC is patent. No evidence of thrombus, stenosis, or occlusion. No variant venous anatomy. Successful placement of the IVC filter below the level of the renal veins.  IMPRESSION: Successful ultrasound and fluoroscopically guided placement of an infrarenal retrievable IVC filter via right jugular approach.  Given the patient's history of prior hemorrhagic stroke, this filter should be considered a permanent device.  Signed,  Sterling Big, MD  [facility]  [facility]   Electronically Signed   By: Malachy Moan M.D.   On: 10/18/2013 15:28   US Renal  10/14/2013   CLINICAL DATA:  Acute renal failure  EXAM: RENAL/URINARY TRACT ULTRASOUND COMPLETE  COMPARISON:  None.  FINDINGS: Right Kidney  Length: 9.9. Echogenicity within normal limits. No mass or hydronephrosis visualized.  Left Kidney  Length: 10.5. Echogenicity within normal limits. No mass or hydronephrosis visualized.  Bladder  Decompressed by indwelling Foley catheter.  Additional comments: Left pleural effusion.  IMPRESSION: No hydronephrosis.  Bladder decompressed by indwelling Foley catheter.   Electronically Signed   By: Charline Bills M.D.   On: 10/14/2013 09:51   Dg Chest Port 1 View  10/26/2013   CLINICAL DATA:  Tachypnea.  Query aspiration pneumonitis.  EXAM: PORTABLE CHEST - 1 VIEW  COMPARISON:  10/19/2013  FINDINGS: Similar appearance of opacification of much of the left hemithorax with smaller left mid lung band of aerated lung. Left apical gas-filled cavity versus collection of bulla, similar to prior. Left heart border obscured. Left hemidiaphragm remains obscured. Subtle airspace opacity at the right  lung base, not changed. Pleural thickening on the left suspicious  for loculated pleural effusion.  IMPRESSION: 1. Stable appearance of the chest, with opacification of much of the left hemithorax, left loculated pleural effusion, probable scattered pneumonia, and left apical gas density potentially representing aerated bullae collection or cavitation. 2. Subtle airspace opacity at the right lung base may reflect early pneumonia.   Electronically Signed   By: Herbie Baltimore M.D.   On: 10/26/2013 21:07   Dg Chest Port 1 View  2013-10-28   CLINICAL DATA:  Fever  EXAM: PORTABLE CHEST - 1 VIEW  COMPARISON:  10/28/2013  FINDINGS: Normal cardiac silhouette. Opacification of the left hemi thorax is again demonstrated. There is bullous change at the left lung apex. There bilateral moderate effusions which are slightly increased compared to prior. Right upper lobe is clear.  IMPRESSION: 1. Increasing bilateral effusions. 2. Stable opacification of the left hemi thorax.   Electronically Signed   By: Genevive Bi M.D.   On: 10-28-2013 18:03   Dg Chest Port 1 View  10/17/2013   CLINICAL DATA:  Left central line placement  EXAM: PORTABLE CHEST - 1 VIEW  COMPARISON:  10/17/2013  FINDINGS: New left central line insertion, tip at the proximal SVC level. This could be advanced 6 cm to the SVC RA junction. Stable heart size and vascularity. Dense consolidative airspace process throughout the left lung compatible with pneumonia. Stable right lung aeration. No enlarging effusion or pneumothorax.  IMPRESSION: New left IJ central line tip proximal SVC, could be advanced 6 cm to the SVC RA junction  No significant change in dense left lung airspace process/ pneumonia  No pneumothorax   Electronically Signed   By: Ruel Favors M.D.   On: 10/17/2013 09:07   Dg Chest Port 1 View  10/17/2013   CLINICAL DATA:  Weakness. Shortness of Breath.  EXAM: PORTABLE CHEST - 1 VIEW  COMPARISON:  10/15/2013 and 10/11/2013  FINDINGS: Left lower lobe consolidation may represent atelectasis or infiltrate.   Remainder of the left lung with diffuse airspace disease which may reflect underlying pneumonia.  Right lower lobe airspace disease may represent atelectasis or infiltrate.  Recommend follow-up on to clearance to exclude underlying mass.  Cardiomegaly.  Pulmonary vascular prominence.  Tortuous aorta.  Nasogastric tube tip gastric fundus.  No gross pneumothorax.  IMPRESSION: No significant change since the prior examination in appearance of asymmetric airspace disease (much more notable on the left) as detailed above.   Electronically Signed   By: Bridgett Larsson M.D.   On: 10/17/2013 07:28   Dg Chest Port 1 View  10/15/2013   CLINICAL DATA:  Check tube placement  EXAM: PORTABLE CHEST - 1 VIEW  COMPARISON:  10/14/2013, 10/11/2013.  FINDINGS: There is no endotracheal to present within the field of view. There is a nasogastric tube present with the tip projecting over the stomach. There is gaseous distention of the stomach.  There is mild right basilar airspace disease unchanged in the prior exam. There is diffuse left lung interstitial and alveolar airspace opacities involving the upper and lower lungs. The heart and mediastinum are stable. The osseous structures demonstrate no focal abnormality.  IMPRESSION: Diffuse left lung interstitial and alveolar airspace opacities concerning for multi lobar pneumonia. There is right lower lobe airspace disease which may reflect atelectasis versus pneumonia.  Nasogastric tube with the tip projecting over the stomach.  No endotracheal tube is present within the field of view.   Electronically Signed   By:  Elige Ko   On: 10/15/2013 08:27   Dg Chest Port 1 View  10/14/2013   CLINICAL DATA:  Shortness of breath.  EXAM: PORTABLE CHEST - 1 VIEW  COMPARISON:  10/11/2013  FINDINGS: Significant worsening of left lung pneumonia is noted by chest x-ray now involving essentially the entire left lung. No overt edema or significant associated pleural fluid is identified. The heart  size and mediastinal contours are stable.  IMPRESSION: Significant worsening of left lung pneumonia.   Electronically Signed   By: Irish Lack M.D.   On: 10/14/2013 08:14   Dg Abd Portable 1v  10/20/2013   CLINICAL DATA:  Check barium placement for were gastrostomy procedure  EXAM: PORTABLE ABDOMEN - 1 VIEW  COMPARISON:  10/19/2013  FINDINGS: Persisting contrast is noted within the right colon. Minimal contrast is noted within the transverse colon. No obstructive changes are noted. A IVC filter is again seen. Diffuse infiltrate is noted within the left lung.  IMPRESSION: Minimal contrast within the transverse colon.   Electronically Signed   By: Alcide Clever M.D.   On: 10/20/2013 07:25   Dg Abd Portable 1v  10/19/2013   CLINICAL DATA:  Evaluate barium prior to potential gastrostomy tube placement  EXAM: PORTABLE ABDOMEN - 1 VIEW  COMPARISON:  10/19/2013; CT abdomen and pelvis -10/18/2013  FINDINGS: Enteric contrast is not seen primarily within the cecum and terminal ileum. There has been reduction in the amount of enteric contrast within the stomach with minimal amount remaining within the gastric fundus.  Nondiagnostic evaluation of the bowel gas pattern secondary to positioning and patient motion artifact. Enteric tube projects over the gastric fundus, tip obscured due to patient motion.  Limited visualization of the lower thorax suggest grossly unchanged left mid and lower lung heterogeneous opacities. Query trace left-sided effusion.  Grossly unchanged bones. IVC filter overlies the right mid hemiabdomen.  IMPRESSION: Enteric contrast now seen primarily within the terminal ileum and cecum. Minimal amount of contrast remaining within the gastric fundus.   Electronically Signed   By: Simonne Come M.D.   On: 10/19/2013 12:40   Dg Abd Portable 1v  10/19/2013   CLINICAL DATA:  Evaluate barium.  EXAM: PORTABLE ABDOMEN - 1 VIEW  COMPARISON:  CT of the abdomen and pelvis.  October 18, 2013  FINDINGS: The  right aspect of the abdomen and pelvis is not imaged on this examination. Feeding tube in place, distal tip projecting in gastric antrum. Contrast in the stomach, and included small bowel. Bowel gas pattern is nondilated and nonobstructive. Inferior vena caval filter projects to the right of L2-3.  Included view of the chest demonstrates dense consolidation in left lung.  IMPRESSION: Feeding tube tip projects in gastric antrum. Contrast in the stomach and imaged small bowel, with nonspecific bowel gas pattern.   Electronically Signed   By: Awilda Metro   On: 10/19/2013 06:18   Dg Abd Portable 1v  10/14/2013   CLINICAL DATA:  Ileus  EXAM: PORTABLE ABDOMEN - 1 VIEW  COMPARISON:  Prior film same day  FINDINGS: No definite free abdominal air is noted. NG tube in place. Again noted mild gaseous distension of transverse colon.  IMPRESSION: No definite free abdominal air. Again noted mild gaseous distension transverse colon.   Electronically Signed   By: Natasha Mead M.D.   On: 10/14/2013 17:51   Dg Abd Portable 1v  10/14/2013   CLINICAL DATA:  NG tube check.  EXAM: PORTABLE ABDOMEN - 1 VIEW  COMPARISON:  Chest x-ray 10/11/2013 and 08/08/2013  FINDINGS: There has been placement of an enteric tube with tip over the stomach in the left upper quadrant. There is a mildly dilated air-filled transverse colon. There is suggestion of airspace opacification over the left mid to upper lung. There are degenerative changes of the spine.  IMPRESSION: Minimally dilated air-filled transverse colon as cannot exclude distal colonic obstruction or ileus. Recommend followup abdominal films as clinically indicated.  Airspace opacification over the left mid to upper lung. Recommend correlation with chest radiograph.  Enteric tube with tip over the stomach in the left upper quadrant.   Electronically Signed   By: Elberta Fortis M.D.   On: 10/14/2013 13:30    Jeoffrey Massed, MD  [facility] Pager:[phone]  If  7PM-7AM, please contact night-coverage [email or web address] Password [email or web address] 2013/11/08, 11:49 AM   LOS: 2 days

## 2013-11-06 DEATH — deceased

## 2013-11-29 IMAGING — CT CT HEAD W/O CM
1 series · 15 of 30 positions shown, 19 images · non-contrast
Comparison: 08/27/2008.

CLINICAL DATA: A aphasia.  Code stroke.

CT HEAD WITHOUT CONTRAST
TECHNIQUE: Contiguous axial images were obtained from the base of
the skull through the vertex without contrast.

[Series 2: headseq 4.8 h37s · axial · 0.46mm/px · z∈[+105,+268]mm · 15 of 36 slices shown, 19 images]
[im 2/36  brain]
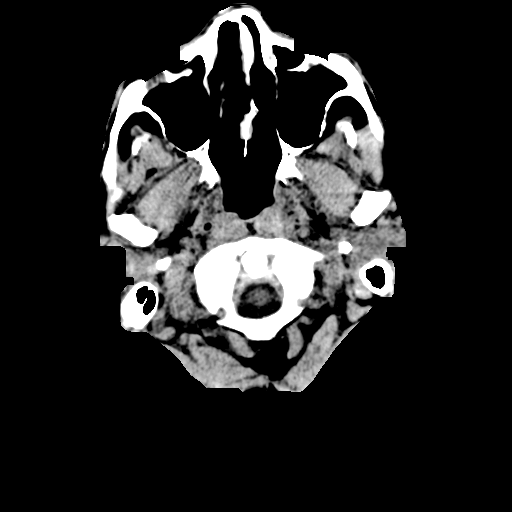
[im 2/36  bone]
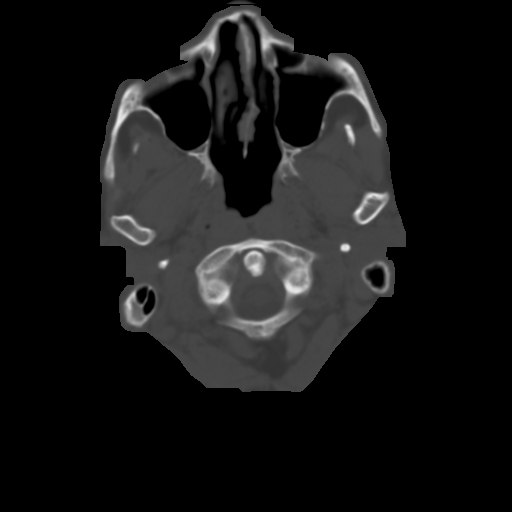
[im 4/36  brain]
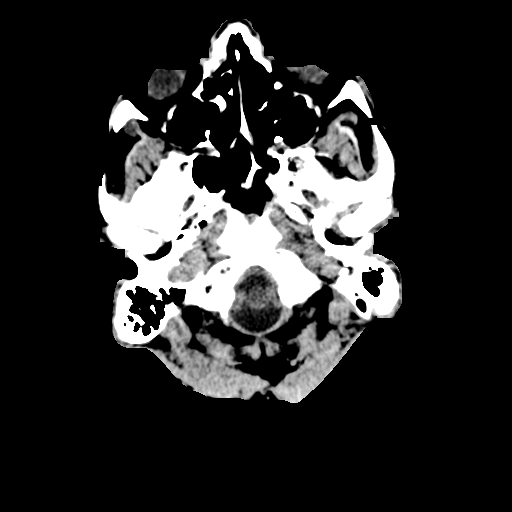
[im 7/36  brain]
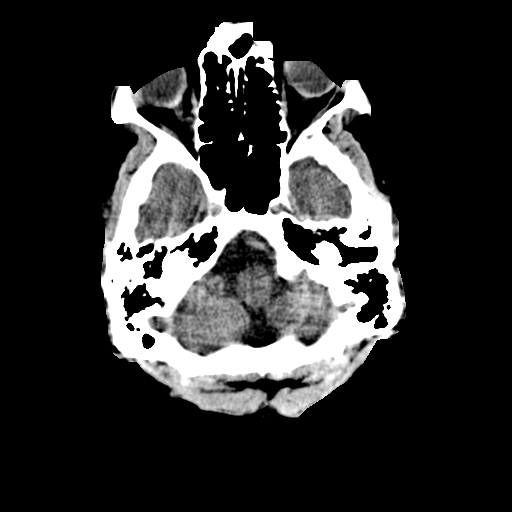
[im 9/36  brain]
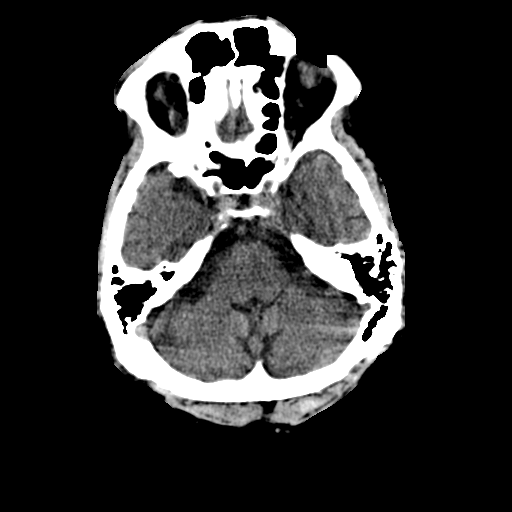
[im 11/36  brain]
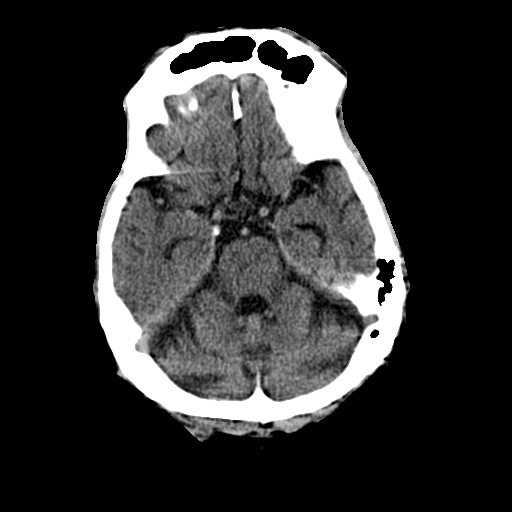
[im 11/36  bone]
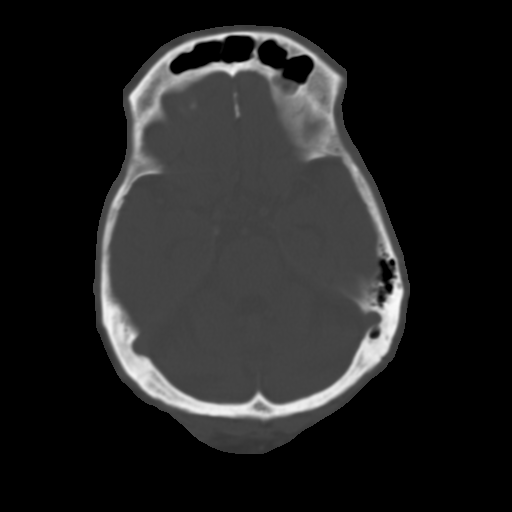
[im 14/36  brain]
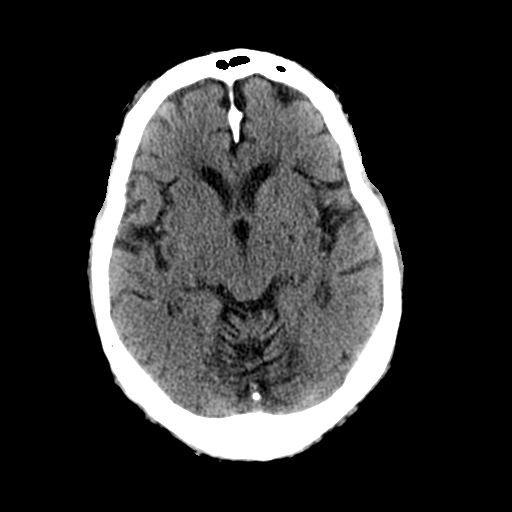
[im 16/36  brain]
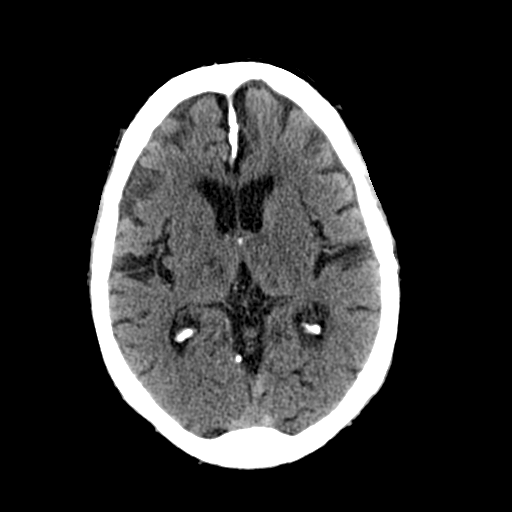
[im 19/36  brain]
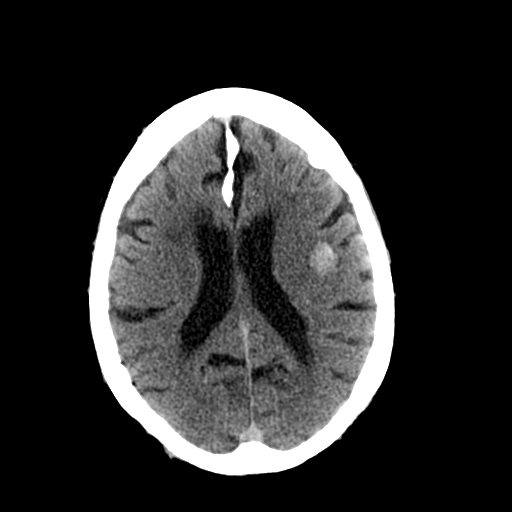
[im 20/36  brain]
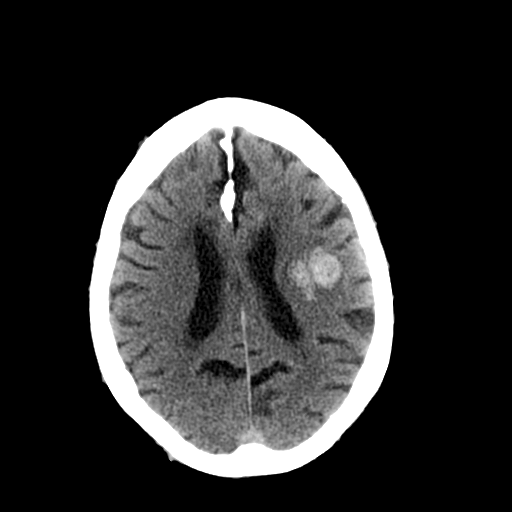
[im 20/36  bone]
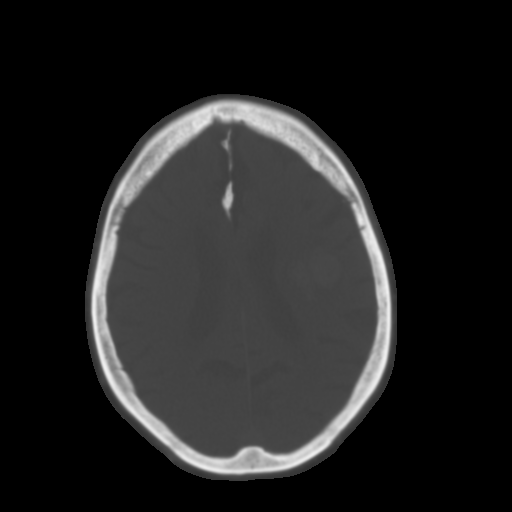
[im 22/36  brain]
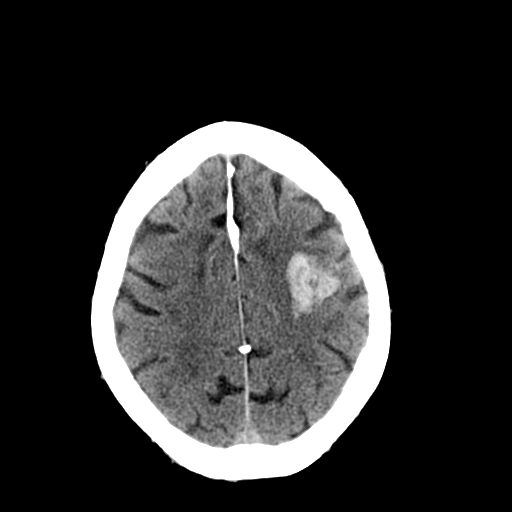
[im 25/36  brain]
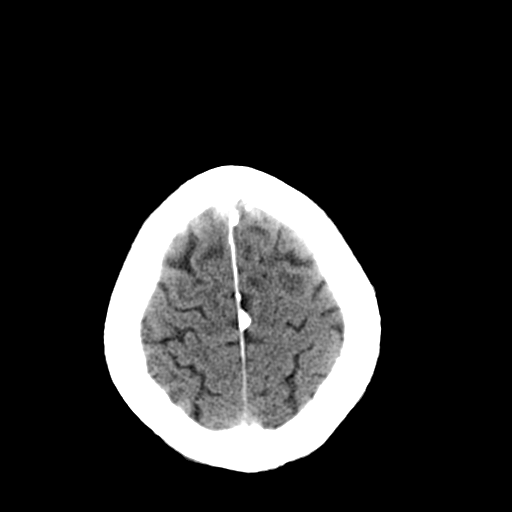
[im 27/36  brain]
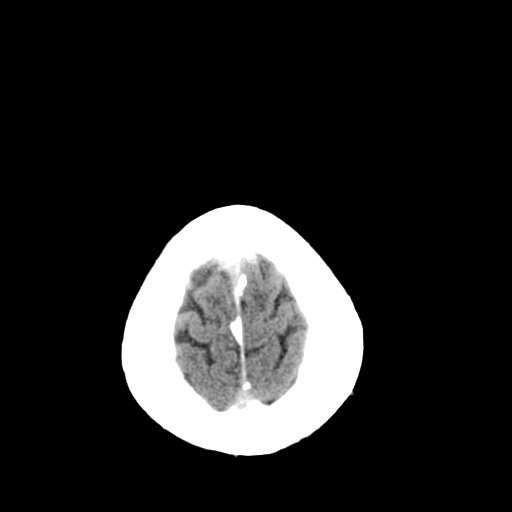
[im 29/36  brain]
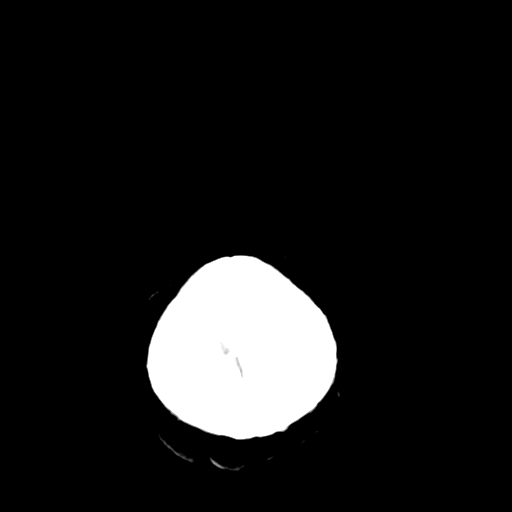
[im 29/36  bone]
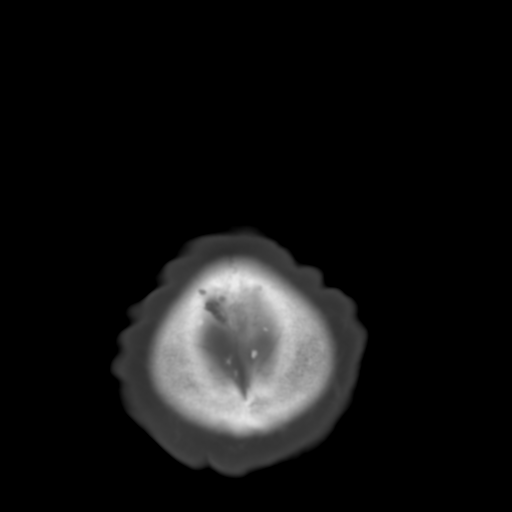
[im 32/36  brain]
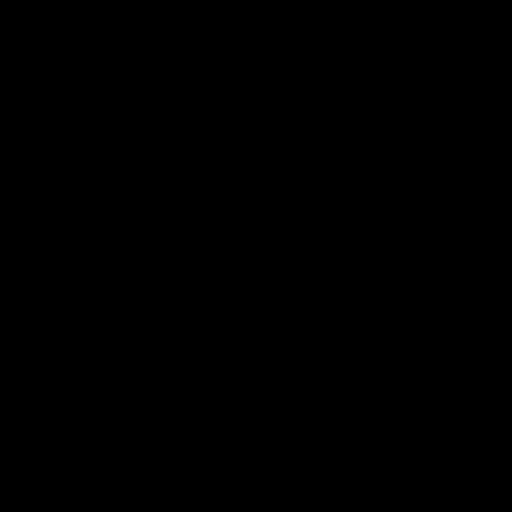
[im 34/36  brain]
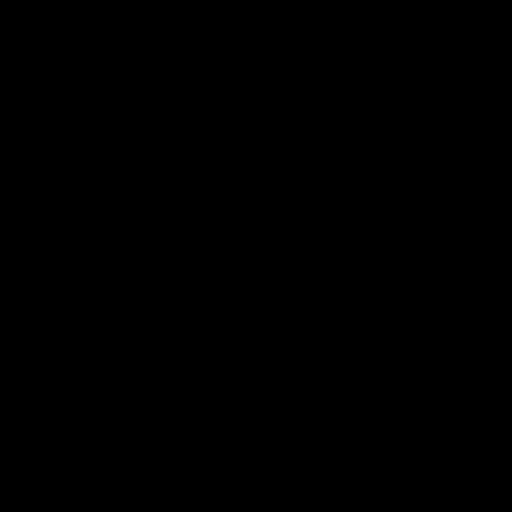

[15 of 30 positions shown; findings below may reference images not displayed]

FINDINGS: Skull:No acute osseous abnormality.  Chronic flattening of the
nasal bridge.

Orbits: No acute abnormality.

Brain:  3 cm hematoma within the subcortical posterior left frontal
lobe.  No surrounding edema currently.  No subarachnoid or
intraventricular extension.

Extensive chronic small vessel ischemic changes with bilateral deep
grey lacunar infarctions and patchy bilateral cerebral white matter
ischemic injuries, particularly around the frontal horns.  There is
global brain atrophy, age advanced.

No hydrocephalus, shift, or herniation.

Critical Value/emergent results were called by telephone at the
time of interpretation on 08/07/2013 at 4414 hours to Dr Gauri, who
verbally acknowledged these results.
IMPRESSION: 1.Three cm posterior left frontal intraparenchymal hematoma.

2. Brain atrophy and extensive chronic small vessel ischemic
injury.
